# Patient Record
Sex: Female | Born: 1935 | Race: White | Hispanic: No | Smoking: Former smoker
Health system: Southern US, Community
[De-identification: ages and names within clinical notes are randomized; demographics above are authoritative.]

## PROBLEM LIST (undated history)

## (undated) DIAGNOSIS — K2970 Gastritis, unspecified, without bleeding: Secondary | ICD-10-CM

## (undated) DIAGNOSIS — F0390 Unspecified dementia without behavioral disturbance: Secondary | ICD-10-CM

## (undated) DIAGNOSIS — M1990 Unspecified osteoarthritis, unspecified site: Secondary | ICD-10-CM

## (undated) DIAGNOSIS — G459 Transient cerebral ischemic attack, unspecified: Secondary | ICD-10-CM

## (undated) DIAGNOSIS — K922 Gastrointestinal hemorrhage, unspecified: Secondary | ICD-10-CM

## (undated) DIAGNOSIS — T148XXA Other injury of unspecified body region, initial encounter: Secondary | ICD-10-CM

## (undated) DIAGNOSIS — K5790 Diverticulosis of intestine, part unspecified, without perforation or abscess without bleeding: Secondary | ICD-10-CM

## (undated) DIAGNOSIS — D500 Iron deficiency anemia secondary to blood loss (chronic): Secondary | ICD-10-CM

## (undated) DIAGNOSIS — Z8379 Family history of other diseases of the digestive system: Secondary | ICD-10-CM

## (undated) DIAGNOSIS — E7800 Pure hypercholesterolemia, unspecified: Secondary | ICD-10-CM

## (undated) DIAGNOSIS — F32A Depression, unspecified: Secondary | ICD-10-CM

## (undated) DIAGNOSIS — F329 Major depressive disorder, single episode, unspecified: Secondary | ICD-10-CM

## (undated) HISTORY — DX: Other injury of unspecified body region, initial encounter: T14.8XXA

## (undated) HISTORY — DX: Transient cerebral ischemic attack, unspecified: G45.9

## (undated) HISTORY — PX: ABDOMINAL HYSTERECTOMY: SHX81

## (undated) HISTORY — DX: Diverticulosis of intestine, part unspecified, without perforation or abscess without bleeding: K57.90

## (undated) HISTORY — DX: Depression, unspecified: F32.A

## (undated) HISTORY — DX: Major depressive disorder, single episode, unspecified: F32.9

## (undated) HISTORY — PX: OVARIAN CYST REMOVAL: SHX89

## (undated) HISTORY — DX: Pure hypercholesterolemia, unspecified: E78.00

## (undated) HISTORY — DX: Gastritis, unspecified, without bleeding: K29.70

## (undated) HISTORY — DX: Family history of other diseases of the digestive system: Z83.79

## (undated) HISTORY — PX: TONSILLECTOMY: SUR1361

## (undated) HISTORY — PX: APPENDECTOMY: SHX54

## (undated) HISTORY — DX: Unspecified osteoarthritis, unspecified site: M19.90

---

## 1958-09-22 HISTORY — PX: LAPAROTOMY: SHX154

## 1991-09-23 HISTORY — PX: ABDOMINAL ADHESION SURGERY: SHX90

## 1998-06-06 ENCOUNTER — Other Ambulatory Visit: Admission: RE | Admit: 1998-06-06 | Discharge: 1998-06-06 | Payer: Self-pay | Admitting: Obstetrics and Gynecology

## 1999-02-18 ENCOUNTER — Encounter: Payer: Self-pay | Admitting: Emergency Medicine

## 1999-02-19 ENCOUNTER — Observation Stay (HOSPITAL_COMMUNITY): Admission: EM | Admit: 1999-02-19 | Discharge: 1999-02-19 | Payer: Self-pay | Admitting: Emergency Medicine

## 1999-02-19 ENCOUNTER — Encounter: Payer: Self-pay | Admitting: Emergency Medicine

## 1999-09-23 DIAGNOSIS — K5790 Diverticulosis of intestine, part unspecified, without perforation or abscess without bleeding: Secondary | ICD-10-CM

## 1999-09-23 HISTORY — DX: Diverticulosis of intestine, part unspecified, without perforation or abscess without bleeding: K57.90

## 1999-10-04 ENCOUNTER — Other Ambulatory Visit: Admission: RE | Admit: 1999-10-04 | Discharge: 1999-10-04 | Payer: Self-pay | Admitting: Obstetrics and Gynecology

## 1999-12-02 ENCOUNTER — Emergency Department (HOSPITAL_COMMUNITY): Admission: EM | Admit: 1999-12-02 | Discharge: 1999-12-02 | Payer: Self-pay | Admitting: Emergency Medicine

## 1999-12-10 ENCOUNTER — Encounter: Payer: Self-pay | Admitting: Gastroenterology

## 1999-12-10 ENCOUNTER — Ambulatory Visit (HOSPITAL_COMMUNITY): Admission: RE | Admit: 1999-12-10 | Discharge: 1999-12-10 | Payer: Self-pay | Admitting: Gastroenterology

## 2000-01-08 ENCOUNTER — Encounter: Payer: Self-pay | Admitting: Gastroenterology

## 2000-05-11 ENCOUNTER — Encounter: Admission: RE | Admit: 2000-05-11 | Discharge: 2000-06-09 | Payer: Self-pay | Admitting: Sports Medicine

## 2000-08-24 ENCOUNTER — Encounter: Admission: RE | Admit: 2000-08-24 | Discharge: 2000-09-30 | Payer: Self-pay | Admitting: Sports Medicine

## 2000-10-02 ENCOUNTER — Emergency Department (HOSPITAL_COMMUNITY): Admission: EM | Admit: 2000-10-02 | Discharge: 2000-10-02 | Payer: Self-pay | Admitting: Emergency Medicine

## 2000-10-02 ENCOUNTER — Encounter: Payer: Self-pay | Admitting: Emergency Medicine

## 2001-05-04 ENCOUNTER — Other Ambulatory Visit: Admission: RE | Admit: 2001-05-04 | Discharge: 2001-05-04 | Payer: Self-pay | Admitting: Obstetrics and Gynecology

## 2001-05-10 ENCOUNTER — Emergency Department (HOSPITAL_COMMUNITY): Admission: EM | Admit: 2001-05-10 | Discharge: 2001-05-10 | Payer: Self-pay | Admitting: Emergency Medicine

## 2001-05-10 ENCOUNTER — Encounter: Payer: Self-pay | Admitting: Emergency Medicine

## 2001-08-14 ENCOUNTER — Encounter: Payer: Self-pay | Admitting: Emergency Medicine

## 2001-08-14 ENCOUNTER — Inpatient Hospital Stay (HOSPITAL_COMMUNITY): Admission: EM | Admit: 2001-08-14 | Discharge: 2001-08-19 | Payer: Self-pay | Admitting: Emergency Medicine

## 2001-08-15 ENCOUNTER — Encounter: Payer: Self-pay | Admitting: Gastroenterology

## 2001-10-07 ENCOUNTER — Encounter: Payer: Self-pay | Admitting: Internal Medicine

## 2001-10-07 ENCOUNTER — Ambulatory Visit (HOSPITAL_COMMUNITY): Admission: RE | Admit: 2001-10-07 | Discharge: 2001-10-07 | Payer: Self-pay | Admitting: Internal Medicine

## 2002-08-10 ENCOUNTER — Other Ambulatory Visit: Admission: RE | Admit: 2002-08-10 | Discharge: 2002-08-10 | Payer: Self-pay | Admitting: Obstetrics and Gynecology

## 2003-12-06 ENCOUNTER — Encounter: Admission: RE | Admit: 2003-12-06 | Discharge: 2003-12-06 | Payer: Self-pay | Admitting: Family Medicine

## 2004-04-15 ENCOUNTER — Emergency Department (HOSPITAL_COMMUNITY): Admission: EM | Admit: 2004-04-15 | Discharge: 2004-04-15 | Payer: Self-pay | Admitting: Internal Medicine

## 2005-01-30 ENCOUNTER — Ambulatory Visit: Payer: Self-pay | Admitting: Gastroenterology

## 2005-02-04 ENCOUNTER — Ambulatory Visit: Payer: Self-pay | Admitting: Gastroenterology

## 2005-02-14 ENCOUNTER — Ambulatory Visit: Payer: Self-pay | Admitting: Gastroenterology

## 2005-04-17 ENCOUNTER — Ambulatory Visit: Payer: Self-pay

## 2005-11-06 ENCOUNTER — Ambulatory Visit: Payer: Self-pay | Admitting: Gastroenterology

## 2006-07-28 ENCOUNTER — Ambulatory Visit: Payer: Self-pay | Admitting: Family Medicine

## 2006-08-06 ENCOUNTER — Ambulatory Visit: Payer: Self-pay | Admitting: Family Medicine

## 2006-08-06 LAB — CONVERTED CEMR LAB
ALT: 19 units/L (ref 0–40)
AST: 24 units/L (ref 0–37)
Alkaline Phosphatase: 59 units/L (ref 39–117)
Basophils Absolute: 0 10*3/uL (ref 0.0–0.1)
Basophils Relative: 0.8 % (ref 0.0–1.0)
CO2: 29 meq/L (ref 19–32)
Calcium: 9.2 mg/dL (ref 8.4–10.5)
Chloride: 102 meq/L (ref 96–112)
Chol/HDL Ratio, serum: 3.2
Cholesterol: 204 mg/dL (ref 0–200)
Eosinophil percent: 2.2 % (ref 0.0–5.0)
GFR calc non Af Amer: 84 mL/min
Glomerular Filtration Rate, Af Am: 102 mL/min/{1.73_m2}
Glucose, Bld: 104 mg/dL — ABNORMAL HIGH (ref 70–99)
MCV: 92 fL (ref 78.0–100.0)
Monocytes Relative: 9.1 % (ref 3.0–11.0)
Neutrophils Relative %: 59 % (ref 43.0–77.0)
Platelets: 320 10*3/uL (ref 150–400)
Potassium: 3.9 meq/L (ref 3.5–5.1)
RBC: 4.28 M/uL (ref 3.87–5.11)
RDW: 13.8 % (ref 11.5–14.6)
Sodium: 138 meq/L (ref 135–145)
TSH: 1.81 microintl units/mL (ref 0.35–5.50)
Total Bilirubin: 0.7 mg/dL (ref 0.3–1.2)
Total Protein: 6.5 g/dL (ref 6.0–8.3)
WBC: 5.5 10*3/uL (ref 4.5–10.5)

## 2006-09-22 DIAGNOSIS — K2970 Gastritis, unspecified, without bleeding: Secondary | ICD-10-CM

## 2006-09-22 HISTORY — DX: Gastritis, unspecified, without bleeding: K29.70

## 2006-11-17 ENCOUNTER — Ambulatory Visit: Payer: Self-pay | Admitting: Family Medicine

## 2006-12-22 ENCOUNTER — Ambulatory Visit: Payer: Self-pay | Admitting: Gastroenterology

## 2006-12-22 LAB — CONVERTED CEMR LAB
Basophils Absolute: 0.1 10*3/uL (ref 0.0–0.1)
Eosinophils Absolute: 0.2 10*3/uL (ref 0.0–0.6)
Eosinophils Relative: 2.6 % (ref 0.0–5.0)
HCT: 32 % — ABNORMAL LOW (ref 36.0–46.0)
Hemoglobin: 11.2 g/dL — ABNORMAL LOW (ref 12.0–15.0)
MCV: 89.1 fL (ref 78.0–100.0)
Monocytes Absolute: 0.8 10*3/uL — ABNORMAL HIGH (ref 0.2–0.7)
Monocytes Relative: 10.1 % (ref 3.0–11.0)
Platelets: 349 10*3/uL (ref 150–400)
RBC: 3.6 M/uL — ABNORMAL LOW (ref 3.87–5.11)

## 2006-12-25 ENCOUNTER — Ambulatory Visit: Payer: Self-pay | Admitting: Family Medicine

## 2007-02-02 ENCOUNTER — Encounter: Payer: Self-pay | Admitting: Family Medicine

## 2007-02-02 ENCOUNTER — Ambulatory Visit: Payer: Self-pay | Admitting: Family Medicine

## 2007-02-02 DIAGNOSIS — K149 Disease of tongue, unspecified: Secondary | ICD-10-CM | POA: Insufficient documentation

## 2007-02-02 DIAGNOSIS — F411 Generalized anxiety disorder: Secondary | ICD-10-CM | POA: Insufficient documentation

## 2007-02-19 DIAGNOSIS — I059 Rheumatic mitral valve disease, unspecified: Secondary | ICD-10-CM | POA: Insufficient documentation

## 2007-02-19 DIAGNOSIS — M810 Age-related osteoporosis without current pathological fracture: Secondary | ICD-10-CM | POA: Insufficient documentation

## 2007-02-19 DIAGNOSIS — K219 Gastro-esophageal reflux disease without esophagitis: Secondary | ICD-10-CM

## 2007-02-19 DIAGNOSIS — K5730 Diverticulosis of large intestine without perforation or abscess without bleeding: Secondary | ICD-10-CM

## 2007-02-19 DIAGNOSIS — Z8679 Personal history of other diseases of the circulatory system: Secondary | ICD-10-CM | POA: Insufficient documentation

## 2007-02-23 ENCOUNTER — Ambulatory Visit: Payer: Self-pay | Admitting: Family Medicine

## 2007-02-23 DIAGNOSIS — F329 Major depressive disorder, single episode, unspecified: Secondary | ICD-10-CM | POA: Insufficient documentation

## 2007-02-23 DIAGNOSIS — S0083XA Contusion of other part of head, initial encounter: Secondary | ICD-10-CM

## 2007-02-23 DIAGNOSIS — S0003XA Contusion of scalp, initial encounter: Secondary | ICD-10-CM | POA: Insufficient documentation

## 2007-02-23 DIAGNOSIS — S1093XA Contusion of unspecified part of neck, initial encounter: Secondary | ICD-10-CM

## 2007-03-23 ENCOUNTER — Ambulatory Visit: Payer: Self-pay | Admitting: Family Medicine

## 2007-03-23 DIAGNOSIS — F329 Major depressive disorder, single episode, unspecified: Secondary | ICD-10-CM

## 2007-04-26 ENCOUNTER — Telehealth (INDEPENDENT_AMBULATORY_CARE_PROVIDER_SITE_OTHER): Payer: Self-pay | Admitting: *Deleted

## 2007-06-01 ENCOUNTER — Ambulatory Visit: Payer: Self-pay | Admitting: Gastroenterology

## 2007-06-01 LAB — CONVERTED CEMR LAB
BUN: 11 mg/dL (ref 6–23)
CO2: 30 meq/L (ref 19–32)
Calcium: 9.3 mg/dL (ref 8.4–10.5)
Creatinine, Ser: 0.6 mg/dL (ref 0.4–1.2)
Eosinophils Relative: 3 % (ref 0.0–5.0)
GFR calc non Af Amer: 101 mL/min
Glucose, Bld: 93 mg/dL (ref 70–99)
Hemoglobin: 12.3 g/dL (ref 12.0–15.0)
Iron: 84 ug/dL (ref 42–145)
Lymphocytes Relative: 34.8 % (ref 12.0–46.0)
Monocytes Relative: 10.6 % (ref 3.0–11.0)
Neutro Abs: 3.4 10*3/uL (ref 1.4–7.7)
Neutrophils Relative %: 51 % (ref 43.0–77.0)
Potassium: 4.5 meq/L (ref 3.5–5.1)
RDW: 14.2 % (ref 11.5–14.6)
Saturation Ratios: 19.6 % — ABNORMAL LOW (ref 20.0–50.0)
Sodium: 133 meq/L — ABNORMAL LOW (ref 135–145)
Total Bilirubin: 0.6 mg/dL (ref 0.3–1.2)
Total Protein: 6.7 g/dL (ref 6.0–8.3)
Transferrin: 306.7 mg/dL (ref >=212.0)
Vitamin B-12: 474 pg/mL (ref 211–911)
WBC: 6.6 10*3/uL (ref 4.5–10.5)

## 2007-06-09 ENCOUNTER — Ambulatory Visit: Payer: Self-pay | Admitting: Gastroenterology

## 2007-06-09 ENCOUNTER — Encounter: Payer: Self-pay | Admitting: Family Medicine

## 2007-06-15 ENCOUNTER — Ambulatory Visit: Payer: Self-pay | Admitting: Gastroenterology

## 2007-08-02 ENCOUNTER — Ambulatory Visit: Payer: Self-pay | Admitting: Family Medicine

## 2007-08-02 DIAGNOSIS — E8941 Symptomatic postprocedural ovarian failure: Secondary | ICD-10-CM | POA: Insufficient documentation

## 2007-08-03 ENCOUNTER — Ambulatory Visit: Payer: Self-pay | Admitting: Family Medicine

## 2007-08-04 ENCOUNTER — Telehealth (INDEPENDENT_AMBULATORY_CARE_PROVIDER_SITE_OTHER): Payer: Self-pay | Admitting: *Deleted

## 2007-08-09 ENCOUNTER — Telehealth (INDEPENDENT_AMBULATORY_CARE_PROVIDER_SITE_OTHER): Payer: Self-pay | Admitting: *Deleted

## 2007-08-09 LAB — CONVERTED CEMR LAB
BUN: 9 mg/dL (ref 6–23)
Bacteria, UA: NEGATIVE
Basophils Relative: 0.9 % (ref 0.0–1.0)
Bilirubin Urine: NEGATIVE
Cholesterol: 199 mg/dL (ref 0–200)
Crystals: NEGATIVE
Glucose, Bld: 104 mg/dL — ABNORMAL HIGH (ref 70–99)
HCT: 39.7 % (ref 36.0–46.0)
Hemoglobin: 13.7 g/dL (ref 12.0–15.0)
Ketones, ur: NEGATIVE mg/dL
Lymphocytes Relative: 29.5 % (ref 12.0–46.0)
MCV: 90.4 fL (ref 78.0–100.0)
Mucus, UA: NEGATIVE
Neutro Abs: 3.5 10*3/uL (ref 1.4–7.7)
Nitrite: NEGATIVE
Platelets: 307 10*3/uL (ref 150–400)
Potassium: 4 meq/L (ref 3.5–5.1)
RBC: 4.39 M/uL (ref 3.87–5.11)
Sodium: 135 meq/L (ref 135–145)
Specific Gravity, Urine: 1.015 (ref 1.000–1.03)
Urine Glucose: NEGATIVE mg/dL
Urobilinogen, UA: 1 (ref 0.0–1.0)

## 2007-08-25 ENCOUNTER — Encounter: Payer: Self-pay | Admitting: Family Medicine

## 2007-09-02 ENCOUNTER — Encounter (INDEPENDENT_AMBULATORY_CARE_PROVIDER_SITE_OTHER): Payer: Self-pay | Admitting: *Deleted

## 2007-11-17 ENCOUNTER — Ambulatory Visit: Payer: Self-pay | Admitting: Family Medicine

## 2007-11-18 ENCOUNTER — Encounter: Payer: Self-pay | Admitting: Family Medicine

## 2007-11-23 LAB — CONVERTED CEMR LAB: Vit D, 1,25-Dihydroxy: 34 (ref 30–89)

## 2007-12-02 ENCOUNTER — Telehealth (INDEPENDENT_AMBULATORY_CARE_PROVIDER_SITE_OTHER): Payer: Self-pay | Admitting: *Deleted

## 2007-12-03 ENCOUNTER — Ambulatory Visit: Payer: Self-pay | Admitting: Family Medicine

## 2007-12-03 DIAGNOSIS — S8010XA Contusion of unspecified lower leg, initial encounter: Secondary | ICD-10-CM | POA: Insufficient documentation

## 2007-12-03 DIAGNOSIS — M545 Low back pain: Secondary | ICD-10-CM

## 2007-12-06 DIAGNOSIS — K2990 Gastroduodenitis, unspecified, without bleeding: Secondary | ICD-10-CM

## 2007-12-06 DIAGNOSIS — K2970 Gastritis, unspecified, without bleeding: Secondary | ICD-10-CM | POA: Insufficient documentation

## 2007-12-09 ENCOUNTER — Telehealth (INDEPENDENT_AMBULATORY_CARE_PROVIDER_SITE_OTHER): Payer: Self-pay | Admitting: *Deleted

## 2007-12-12 ENCOUNTER — Encounter: Admission: RE | Admit: 2007-12-12 | Discharge: 2007-12-12 | Payer: Self-pay | Admitting: Family Medicine

## 2007-12-13 ENCOUNTER — Telehealth (INDEPENDENT_AMBULATORY_CARE_PROVIDER_SITE_OTHER): Payer: Self-pay | Admitting: *Deleted

## 2007-12-13 DIAGNOSIS — S32009A Unspecified fracture of unspecified lumbar vertebra, initial encounter for closed fracture: Secondary | ICD-10-CM | POA: Insufficient documentation

## 2007-12-14 ENCOUNTER — Encounter: Payer: Self-pay | Admitting: Family Medicine

## 2007-12-14 ENCOUNTER — Encounter: Admission: RE | Admit: 2007-12-14 | Discharge: 2007-12-14 | Payer: Self-pay | Admitting: Family Medicine

## 2008-01-06 ENCOUNTER — Emergency Department (HOSPITAL_COMMUNITY): Admission: EM | Admit: 2008-01-06 | Discharge: 2008-01-06 | Payer: Self-pay | Admitting: Emergency Medicine

## 2008-01-06 ENCOUNTER — Telehealth (INDEPENDENT_AMBULATORY_CARE_PROVIDER_SITE_OTHER): Payer: Self-pay | Admitting: *Deleted

## 2008-01-27 ENCOUNTER — Ambulatory Visit: Payer: Self-pay | Admitting: Internal Medicine

## 2008-01-27 DIAGNOSIS — S0990XA Unspecified injury of head, initial encounter: Secondary | ICD-10-CM | POA: Insufficient documentation

## 2008-02-02 ENCOUNTER — Telehealth (INDEPENDENT_AMBULATORY_CARE_PROVIDER_SITE_OTHER): Payer: Self-pay | Admitting: *Deleted

## 2008-03-20 ENCOUNTER — Telehealth (INDEPENDENT_AMBULATORY_CARE_PROVIDER_SITE_OTHER): Payer: Self-pay | Admitting: *Deleted

## 2008-04-13 ENCOUNTER — Ambulatory Visit: Payer: Self-pay | Admitting: Family Medicine

## 2008-05-18 ENCOUNTER — Ambulatory Visit: Payer: Self-pay | Admitting: Internal Medicine

## 2008-05-18 DIAGNOSIS — L0390 Cellulitis, unspecified: Secondary | ICD-10-CM

## 2008-05-18 DIAGNOSIS — L0291 Cutaneous abscess, unspecified: Secondary | ICD-10-CM

## 2008-07-11 ENCOUNTER — Encounter (INDEPENDENT_AMBULATORY_CARE_PROVIDER_SITE_OTHER): Payer: Self-pay | Admitting: *Deleted

## 2008-08-02 ENCOUNTER — Telehealth (INDEPENDENT_AMBULATORY_CARE_PROVIDER_SITE_OTHER): Payer: Self-pay | Admitting: *Deleted

## 2008-10-19 ENCOUNTER — Ambulatory Visit: Payer: Self-pay | Admitting: Gastroenterology

## 2008-10-19 DIAGNOSIS — K5900 Constipation, unspecified: Secondary | ICD-10-CM | POA: Insufficient documentation

## 2008-10-26 ENCOUNTER — Telehealth: Payer: Self-pay | Admitting: Gastroenterology

## 2008-11-27 ENCOUNTER — Telehealth (INDEPENDENT_AMBULATORY_CARE_PROVIDER_SITE_OTHER): Payer: Self-pay | Admitting: *Deleted

## 2008-11-29 ENCOUNTER — Telehealth: Payer: Self-pay | Admitting: Gastroenterology

## 2008-11-30 ENCOUNTER — Ambulatory Visit: Payer: Self-pay | Admitting: Internal Medicine

## 2008-11-30 DIAGNOSIS — R1013 Epigastric pain: Secondary | ICD-10-CM

## 2008-12-04 LAB — CONVERTED CEMR LAB
AST: 27 units/L (ref 0–37)
Alkaline Phosphatase: 59 units/L (ref 39–117)
Amylase: 57 units/L (ref 27–131)
Basophils Relative: 0.9 % (ref 0.0–3.0)
MCV: 92 fL (ref 78.0–100.0)
Monocytes Relative: 9.9 % (ref 3.0–12.0)
Neutro Abs: 3.9 10*3/uL (ref 1.4–7.7)
Neutrophils Relative %: 56.9 % (ref 43.0–77.0)
RBC: 4.29 M/uL (ref 3.87–5.11)
WBC: 6.9 10*3/uL (ref 4.5–10.5)

## 2008-12-06 ENCOUNTER — Ambulatory Visit (HOSPITAL_COMMUNITY): Admission: RE | Admit: 2008-12-06 | Discharge: 2008-12-06 | Payer: Self-pay | Admitting: Internal Medicine

## 2008-12-07 ENCOUNTER — Telehealth (INDEPENDENT_AMBULATORY_CARE_PROVIDER_SITE_OTHER): Payer: Self-pay | Admitting: *Deleted

## 2008-12-07 ENCOUNTER — Encounter: Payer: Self-pay | Admitting: Family Medicine

## 2008-12-19 ENCOUNTER — Telehealth: Payer: Self-pay | Admitting: Family Medicine

## 2009-01-19 ENCOUNTER — Encounter: Payer: Self-pay | Admitting: Family Medicine

## 2009-01-19 ENCOUNTER — Encounter: Admission: RE | Admit: 2009-01-19 | Discharge: 2009-01-19 | Payer: Self-pay | Admitting: Neurology

## 2009-01-25 ENCOUNTER — Encounter (INDEPENDENT_AMBULATORY_CARE_PROVIDER_SITE_OTHER): Payer: Self-pay | Admitting: *Deleted

## 2009-01-25 ENCOUNTER — Telehealth (INDEPENDENT_AMBULATORY_CARE_PROVIDER_SITE_OTHER): Payer: Self-pay | Admitting: *Deleted

## 2009-01-25 ENCOUNTER — Encounter: Payer: Self-pay | Admitting: Family Medicine

## 2009-01-31 ENCOUNTER — Encounter: Admission: RE | Admit: 2009-01-31 | Discharge: 2009-03-08 | Payer: Self-pay | Admitting: Neurology

## 2009-02-01 ENCOUNTER — Telehealth (INDEPENDENT_AMBULATORY_CARE_PROVIDER_SITE_OTHER): Payer: Self-pay | Admitting: *Deleted

## 2009-02-16 ENCOUNTER — Ambulatory Visit: Payer: Self-pay | Admitting: Family Medicine

## 2009-03-09 ENCOUNTER — Ambulatory Visit: Payer: Self-pay | Admitting: Family Medicine

## 2009-03-09 DIAGNOSIS — Z780 Asymptomatic menopausal state: Secondary | ICD-10-CM | POA: Insufficient documentation

## 2009-03-13 ENCOUNTER — Ambulatory Visit: Payer: Self-pay | Admitting: Family Medicine

## 2009-03-13 LAB — CONVERTED CEMR LAB
Bilirubin Urine: NEGATIVE
Blood in Urine, dipstick: NEGATIVE
Glucose, Urine, Semiquant: NEGATIVE
Nitrite: NEGATIVE
Protein, U semiquant: NEGATIVE
Urobilinogen, UA: 0.2

## 2009-03-14 ENCOUNTER — Encounter: Payer: Self-pay | Admitting: Family Medicine

## 2009-03-16 ENCOUNTER — Encounter (INDEPENDENT_AMBULATORY_CARE_PROVIDER_SITE_OTHER): Payer: Self-pay | Admitting: *Deleted

## 2009-03-16 LAB — CONVERTED CEMR LAB
Albumin: 3.7 g/dL (ref 3.5–5.2)
Basophils Absolute: 0 10*3/uL (ref 0.0–0.1)
Calcium: 9 mg/dL (ref 8.4–10.5)
Creatinine, Ser: 0.5 mg/dL (ref 0.4–1.2)
Eosinophils Absolute: 0.3 10*3/uL (ref 0.0–0.7)
Glucose, Bld: 114 mg/dL — ABNORMAL HIGH (ref 70–99)
HDL: 76.6 mg/dL (ref >39.00)
Hemoglobin: 14 g/dL (ref 12.0–15.0)
MCHC: 34.9 g/dL (ref 30.0–36.0)
MCV: 91.3 fL (ref 78.0–100.0)
Neutrophils Relative %: 49.6 % (ref 43.0–77.0)
RBC: 4.37 M/uL (ref 3.87–5.11)
RDW: 13.3 % (ref 11.5–14.6)
Sodium: 137 meq/L (ref 135–145)
Total Protein: 7.1 g/dL (ref 6.0–8.3)
Triglycerides: 60 mg/dL (ref 0.0–149.0)

## 2009-08-10 ENCOUNTER — Ambulatory Visit: Payer: Self-pay | Admitting: Family Medicine

## 2009-08-10 DIAGNOSIS — IMO0002 Reserved for concepts with insufficient information to code with codable children: Secondary | ICD-10-CM | POA: Insufficient documentation

## 2009-08-14 ENCOUNTER — Ambulatory Visit: Payer: Self-pay | Admitting: Family Medicine

## 2009-08-15 ENCOUNTER — Encounter (INDEPENDENT_AMBULATORY_CARE_PROVIDER_SITE_OTHER): Payer: Self-pay | Admitting: *Deleted

## 2009-08-20 ENCOUNTER — Telehealth: Payer: Self-pay | Admitting: Family Medicine

## 2009-09-27 ENCOUNTER — Telehealth: Payer: Self-pay | Admitting: Gastroenterology

## 2009-10-11 ENCOUNTER — Encounter: Payer: Self-pay | Admitting: Family Medicine

## 2009-10-12 ENCOUNTER — Telehealth: Payer: Self-pay | Admitting: Gastroenterology

## 2010-01-07 ENCOUNTER — Ambulatory Visit: Payer: Self-pay | Admitting: Family Medicine

## 2010-01-07 DIAGNOSIS — R5381 Other malaise: Secondary | ICD-10-CM | POA: Insufficient documentation

## 2010-01-07 DIAGNOSIS — R0989 Other specified symptoms and signs involving the circulatory and respiratory systems: Secondary | ICD-10-CM

## 2010-01-07 DIAGNOSIS — R5383 Other fatigue: Secondary | ICD-10-CM

## 2010-01-07 DIAGNOSIS — R0609 Other forms of dyspnea: Secondary | ICD-10-CM

## 2010-01-09 LAB — CONVERTED CEMR LAB
BUN: 13 mg/dL (ref 6–23)
Creatinine, Ser: 0.6 mg/dL (ref 0.4–1.2)
Free T4: 0.8 ng/dL (ref 0.6–1.6)
HCT: 37.1 % (ref 36.0–46.0)
MCV: 92.4 fL (ref 78.0–100.0)
RBC: 4.02 M/uL (ref 3.87–5.11)
RDW: 14.4 % (ref 11.5–14.6)
Sodium: 134 meq/L — ABNORMAL LOW (ref 135–145)
Vitamin B-12: 727 pg/mL (ref 211–911)
WBC: 6.3 10*3/uL (ref 4.5–10.5)

## 2010-01-28 ENCOUNTER — Ambulatory Visit (HOSPITAL_COMMUNITY): Admission: RE | Admit: 2010-01-28 | Discharge: 2010-01-28 | Payer: Self-pay | Admitting: Family Medicine

## 2010-01-28 ENCOUNTER — Encounter: Payer: Self-pay | Admitting: Family Medicine

## 2010-01-28 ENCOUNTER — Ambulatory Visit: Payer: Self-pay | Admitting: Internal Medicine

## 2010-01-28 ENCOUNTER — Ambulatory Visit: Payer: Self-pay

## 2010-02-21 ENCOUNTER — Ambulatory Visit: Payer: Self-pay | Admitting: Cardiovascular Disease

## 2010-02-21 DIAGNOSIS — R002 Palpitations: Secondary | ICD-10-CM | POA: Insufficient documentation

## 2010-03-19 ENCOUNTER — Ambulatory Visit (HOSPITAL_COMMUNITY): Admission: RE | Admit: 2010-03-19 | Discharge: 2010-03-19 | Payer: Self-pay | Admitting: Cardiovascular Disease

## 2010-03-19 ENCOUNTER — Ambulatory Visit: Payer: Self-pay

## 2010-03-19 ENCOUNTER — Ambulatory Visit: Payer: Self-pay | Admitting: Cardiology

## 2010-03-19 ENCOUNTER — Encounter: Payer: Self-pay | Admitting: Cardiovascular Disease

## 2010-04-05 ENCOUNTER — Encounter: Payer: Self-pay | Admitting: Family Medicine

## 2010-04-09 ENCOUNTER — Encounter: Payer: Self-pay | Admitting: Internal Medicine

## 2010-04-12 ENCOUNTER — Ambulatory Visit: Payer: Self-pay | Admitting: Family Medicine

## 2010-04-12 DIAGNOSIS — R413 Other amnesia: Secondary | ICD-10-CM | POA: Insufficient documentation

## 2010-04-12 DIAGNOSIS — S62109A Fracture of unspecified carpal bone, unspecified wrist, initial encounter for closed fracture: Secondary | ICD-10-CM | POA: Insufficient documentation

## 2010-04-22 LAB — CONVERTED CEMR LAB
ALT: 14 units/L (ref 0–35)
AST: 23 units/L (ref 0–37)
Albumin: 4 g/dL (ref 3.5–5.2)
Alkaline Phosphatase: 63 units/L (ref 39–117)
Basophils Absolute: 0.1 10*3/uL (ref 0.0–0.1)
Chloride: 101 meq/L (ref 96–112)
Creatinine, Ser: 0.64 mg/dL (ref 0.40–1.20)
Eosinophils Absolute: 0.3 10*3/uL (ref 0.0–0.7)
Folate: >20 ng/mL
HCT: 39 % (ref 36.0–46.0)
Hemoglobin: 13 g/dL (ref 12.0–15.0)
Lymphocytes Relative: 23 % (ref 12–46)
MCV: 89.4 fL (ref 78.0–100.0)
RBC: 4.36 M/uL (ref 3.87–5.11)
RDW: 14.6 % (ref 11.5–15.5)
Sodium: 136 meq/L (ref 135–145)
TSH: 1.679 microintl units/mL (ref 0.350–4.500)
Vitamin B-12: 869 pg/mL (ref 211–911)

## 2010-05-10 ENCOUNTER — Telehealth: Payer: Self-pay | Admitting: Gastroenterology

## 2010-05-10 ENCOUNTER — Ambulatory Visit: Payer: Self-pay | Admitting: Internal Medicine

## 2010-05-28 ENCOUNTER — Ambulatory Visit: Payer: Self-pay | Admitting: Gastroenterology

## 2010-05-28 ENCOUNTER — Encounter: Admission: RE | Admit: 2010-05-28 | Discharge: 2010-05-28 | Payer: Self-pay | Admitting: Neurology

## 2010-07-22 ENCOUNTER — Telehealth: Payer: Self-pay | Admitting: Family Medicine

## 2010-07-26 ENCOUNTER — Ambulatory Visit: Payer: Self-pay | Admitting: Family Medicine

## 2010-08-05 ENCOUNTER — Telehealth: Payer: Self-pay | Admitting: Family Medicine

## 2010-08-29 ENCOUNTER — Telehealth: Payer: Self-pay | Admitting: Family Medicine

## 2010-09-06 ENCOUNTER — Ambulatory Visit: Payer: Self-pay | Admitting: Family Medicine

## 2010-10-22 NOTE — Progress Notes (Signed)
Summary: Celexa issue  Phone Note Call from Patient   Caller: Daughter [phone]   cell [phone] Summary of Call: Patient daughter left message on triage that her mother has stopped Celexa and requests call to discuss the side effects she is having.  Lucious Groves CMA,  July 22, 2010 3:04 PM  Lm at both numbers to call back to office. Lucious Groves CMA  July 23, 2010 9:53 AM   Lm at both numbers to call back to office. Lucious Groves CMA  July 24, 2010 4:14 PM   Follow-up for Phone Call        daughter--also named Equilla Weseman--returned call---says she will be coming with her mom to the appointment on Friday 11/4 at 3:45, so Tresa Endo does not have to call her back unless Tresa Endo needs to tell [place] something else----daughter Susanne will be bringing an "information sheet" from a psychiatrist ( has not seen patient) of suggested doses of an antidepressant Celexa Follow-up by: Jerolyn Shin,  July 25, 2010 1:12 PM  Additional Follow-up for Phone Call Additional follow up Details #1::        Noted, thanks. Additional Follow-up by: Lucious Groves CMA,  July 25, 2010 3:03 PM

## 2010-10-22 NOTE — Progress Notes (Signed)
Summary: Triage   Phone Note Call from Patient Call back at Home Phone [phone]   Caller: Patient Call For: Dr. Jarold Motto Reason for Call: Talk to Nurse Summary of Call: pt. hurt her arm, been on prednisone....causing abd pain and cannot eat anything Initial call taken by: Karna Christmas,  May 10, 2010 8:34 AM  Follow-up for Phone Call        LM for pt to call.  Lupita Leash Surface RN  May 10, 2010 8:47 AM  Pt states she has been on medications for sprained wrist.  Prednisone and arthritis meds.  Today stomach is burning and pt states she can not eat.  Even water hurts her stomach.  Req OV.  Appt sch for pt to see Rozetta Nunnery NP this pm. Follow-up by: Ashok Cordia RN,  May 10, 2010 10:38 AM

## 2010-10-22 NOTE — Consult Note (Signed)
Summary: [facility]  [facility]   Imported By: Lanelle Bal 10/22/2009 10:53:44  _____________________________________________________________________  External Attachment:    Type:   Image     Comment:   External Document

## 2010-10-22 NOTE — Progress Notes (Signed)
Summary: Triage   Phone Note Call from Patient Call back at Home Phone [phone]   Caller: Patient Call For: Dr Jarold Motto Reason for Call: Talk to Nurse Summary of Call: Wants to know if Carafate if for heartburn and if you have to have a prescription for it? Initial call taken by: Karna Christmas,  September 27, 2009 1:30 PM  Follow-up for Phone Call        Pt states she has had alot of indigestion lately.  Has taken carafate in the past.  Zegerid is listed on medication list but pt states she is not taking it at the present time.  Encouraged pt to resume Zegerid and if no improvement to call back for OV.   Follow-up by: Ashok Cordia RN,  September 27, 2009 2:42 PM

## 2010-10-22 NOTE — Letter (Signed)
Summary: [facility]  [facility]   Imported By: Ashley Burnett 04/18/2010 12:03:23  _____________________________________________________________________  External Attachment:    Type:   Image     Comment:   External Document

## 2010-10-22 NOTE — Progress Notes (Signed)
Summary: verify med instruction  Phone Note Call from Patient Call back at [phone], [phone]   Caller: Daughter(Batya) Summary of Call: Pt daughter would like to know how long she is to continue on  the celexa 20 mg tab until she increases to 40mg . Pt was instructed to Take 1/2 celexa 20 mg every day for 8 days then increase to 1 tab a day. Pls advise...........Marland KitchenFelecia Deloach CMA  August 05, 2010 3:48 PM   Follow-up for Phone Call        stay on 20 mg until I see her in 1 month Follow-up by: Loreen Freud DO,  August 05, 2010 4:43 PM  Additional Follow-up for Phone Call Additional follow up Details #1::        Pt daughter aware to continue with current dose and f/u in 1 month.............Marland KitchenFelecia Deloach CMA  August 05, 2010 5:14 PM

## 2010-10-22 NOTE — Assessment & Plan Note (Signed)
Summary: memory issues, (has new meds)--daughter will come w/her///sph   Vital Signs:  Patient profile:   77 year old female Weight:      107.0 pounds Temp:     97.0 degrees F oral BP sitting:   118 / 74  (left arm) Cuff size:   regular  Vitals Entered By: Almeta Monas CMA Duncan Dull) (July 26, 2010 4:10 PM) CC: c/o memory loss and pt's daughter is concerned that pt may not be taking the correct meds   History of Present Illness: Pt is here with daughter c/o not taking meds since being put on vicodin.   Pt stopped everything cold [place].  Pt is able to drive to [place] and knows exactly where she is going and never gets lost but when her meds are put out for her she takes what she wants when she wants per her daughter.    Current Medications (verified): 1)  Celexa 20 Mg Tabs (Citalopram Hydrobromide) .Marland Kitchen.. 1 By Mouth Once Daily 2)  Multivitamins   Tabs (Multiple Vitamin) .Marland Kitchen.. 1 By Mouth Once Daily 3)  Miralax  Powd (Polyethylene Glycol 3350) .... As Needed 4)  Carafate 1 Gm/34ml  Susp (Sucralfate) .Marland Kitchen.. 10 Cc By Mouth Qid 5)  Omeprazole 20 Mg Cpdr (Omeprazole) .... Take 1 Capsule 30 Min Prior To Breakfast 6)  Aricept 5 Mg Tabs (Donepezil Hcl) .Marland Kitchen.. 1 By Mouth Once Daily  Allergies (verified): 1)  ! Indocin 2)  ! Codeine  Past History:  Past Medical History: Last updated: 11/30/2008 Diverticulosis, universal, severe GERD Osteoporosis Transient ischemic attack, hx of Fractures- leg,wrist High Cholesterol Depression Gastritis  Past Surgical History: Last updated: 11/30/2008 Hysterectomy SBO with laparotomy, 1960s,  SBO with lysis of adhesions, 1993 ovarian cyst removed Tonsillectomy Appendectomy  Family History: Last updated: 10/19/2008 Family History Stroke No FH of Colon Cancer: Family History of Heart Disease: aunt  Social History: Last updated: 10/19/2008 Former Smoker Alcohol use-no Drug use-no Patient does not get regular exercise.   Risk  Factors: Alcohol Use: 0 (03/09/2009) Caffeine Use: 0 (03/09/2009) Exercise: no (03/09/2009)  Risk Factors: Smoking Status: never (03/09/2009) Passive Smoke Exposure: no (08/02/2007)  Family History: Reviewed history from 10/19/2008 and no changes required. Family History Stroke No FH of Colon Cancer: Family History of Heart Disease: aunt  Social History: Reviewed history from 10/19/2008 and no changes required. Former Smoker Alcohol use-no Drug use-no Patient does not get regular exercise.   Review of Systems      See HPI  Physical Exam  General:  Well-developed,well-nourished,in no acute distress; alert,appropriate and cooperative throughout examination Psych:  Cognition and judgment appear intact. Alert and cooperative with normal attention span and concentration. No apparent delusions, illusions, hallucinations   Impression & Recommendations:  Problem # 1:  MEMORY LOSS (ICD-780.93) start Aricept 5 mg 1 by mouth once daily  pt in office with daughter for > 30 min---more than 50% face to face  Problem # 2:  DEPRESSION (ICD-311)  Her updated medication list for this problem includes:    Celexa 20 Mg Tabs (Citalopram hydrobromide) .Marland Kitchen... 1 by mouth once daily  Complete Medication List: 1)  Celexa 20 Mg Tabs (Citalopram hydrobromide) .Marland Kitchen.. 1 by mouth once daily 2)  Multivitamins Tabs (Multiple vitamin) .Marland Kitchen.. 1 by mouth once daily 3)  Miralax Powd (Polyethylene glycol 3350) .... As needed 4)  Carafate 1 Gm/76ml Susp (Sucralfate) .Marland Kitchen.. 10 cc by mouth qid 5)  Omeprazole 20 Mg Cpdr (Omeprazole) .... Take 1 capsule 30 min prior  to breakfast 6)  Aricept 5 Mg Tabs (Donepezil hcl) .Marland Kitchen.. 1 by mouth once daily  Patient Instructions: 1)  Take 1/2 celexa 20 mg every day for 8 days then increase to 1 tab a day  2)  Finish the cephalexin ----do not take the augmentin ( we got rid of it for you) 3)  take the aricept everyday 4)  Please schedule a follow-up appointment in 1 month.    Prescriptions: CELEXA 20 MG TABS (CITALOPRAM HYDROBROMIDE) 1 by mouth once daily  #30 x 0   Entered and Authorized by:   Loreen Freud DO   Signed by:   Loreen Freud DO on 07/26/2010   Method used:   Electronically to        [id number]* (retail)       [address]       [place], [place]  [address]       Ph: [phone]       Fax: [phone]   RxID:   [id number]    Orders Added: 1)  Est. Patient Level IV [[address]]

## 2010-10-22 NOTE — Assessment & Plan Note (Signed)
Summary: REHECK HER BROKEN RIGHT ARM--IN ALOT OF PAIN--PH   Vital Signs:  Patient profile:   77 year old female Height:      64 inches Weight:      108 pounds Temp:     98.4 degrees F oral Pulse rate:   68 / minute BP sitting:   130 / 62  (left arm)  Vitals Entered By: Jeremy Johann CMA (April 12, 2010 2:42 PM) CC: recheck rt wrist   History of Present Illness: Pt here f/u ortho for broken R wrist.  Pt states Dr Eulah Pont told her to f/u here---we have no records.  When records arrived -- pt was sent here to see a rash under her splint.   Pt states rash has resolved.  Pt had trouble remembering when incident with wrist happened and when whe saw Dr Eulah Pont.    Current Medications (verified): 1)  Celexa 40 Mg  Tabs (Citalopram Hydrobromide) .Marland Kitchen.. 1 By Mouth Once Daily 2)  Multivitamins   Tabs (Multiple Vitamin) .Marland Kitchen.. 1 By Mouth Once Daily 3)  Vitamin E 600 Unit  Caps (Vitamin E) .Marland Kitchen.. 1 By Mouth Once Daily 4)  Miralax  Powd (Polyethylene Glycol 3350) .... As Needed 5)  Zegerid 20-1100 Mg Caps (Omeprazole-Sodium Bicarbonate) .Marland Kitchen.. 1 By Mouth Once Daily 6)  Vicodin 5-500 Mg Tabs (Hydrocodone-Acetaminophen) .Marland Kitchen.. 1 By Mouth Every 6 Hours As Needed 7)  Carafate 1 Gm/31ml  Susp (Sucralfate) .Marland Kitchen.. 10 Cc By Mouth Qid 8)  Ultram 50 Mg Tabs (Tramadol Hcl) .... 1/2 -1 By Mouth Every 6 Hours As Needed Pain  Allergies (verified): 1)  ! Indocin 2)  ! Codeine  Past History:  Past medical, surgical, family and social histories (including risk factors) reviewed for relevance to current acute and chronic problems.  Past Medical History: Reviewed history from 11/30/2008 and no changes required. Diverticulosis, universal, severe GERD Osteoporosis Transient ischemic attack, hx of Fractures- leg,wrist High Cholesterol Depression Gastritis  Past Surgical History: Reviewed history from 11/30/2008 and no changes required. Hysterectomy SBO with laparotomy, 1960s,  SBO with lysis of adhesions,  1993 ovarian cyst removed Tonsillectomy Appendectomy  Family History: Reviewed history from 10/19/2008 and no changes required. Family History Stroke No FH of Colon Cancer: Family History of Heart Disease: aunt  Social History: Reviewed history from 10/19/2008 and no changes required. Former Smoker Alcohol use-no Drug use-no Patient does not get regular exercise.   Review of Systems      See HPI  Physical Exam  General:  Well-developed,well-nourished,in no acute distress; alert,appropriate and cooperative throughout examination Msk:  R wrist in splint. Skin:  no rash under splint Psych:  flat affect, memory impairment, disoriented to time, and disoriented to place.     Impression & Recommendations:  Problem # 1:  FRACTURE, WRIST, RIGHT (ICD-814.00) per ortho apparently she had a rash under splint but that has cleared up ultram 50 mg #30  1 by mouth every 6 hours as needed   Problem # 2:  MEMORY LOSS (ICD-780.93)  Orders: Venipuncture ([address]) TLB-B12 + Folate Pnl (60454_09811-B14/NWG) TLB-BMP (Basic Metabolic Panel-BMET) (80048-METABOL) TLB-CBC Platelet - w/Differential (85025-CBCD) TLB-Hepatic/Liver Function Pnl (80076-HEPATIC) TLB-TSH (Thyroid Stimulating Hormone) (95621-HYQ) Neurology Referral (Neuro)  Complete Medication List: 1)  Celexa 40 Mg Tabs (Citalopram hydrobromide) .Marland Kitchen.. 1 by mouth once daily 2)  Multivitamins Tabs (Multiple vitamin) .Marland Kitchen.. 1 by mouth once daily 3)  Vitamin E 600 Unit Caps (Vitamin e) .Marland Kitchen.. 1 by mouth once daily 4)  Miralax Powd (Polyethylene glycol 3350) .... As  needed 5)  Zegerid 20-1100 Mg Caps (Omeprazole-sodium bicarbonate) .Marland Kitchen.. 1 by mouth once daily 6)  Vicodin 5-500 Mg Tabs (Hydrocodone-acetaminophen) .Marland Kitchen.. 1 by mouth every 6 hours as needed 7)  Carafate 1 Gm/56ml Susp (Sucralfate) .Marland Kitchen.. 10 cc by mouth qid 8)  Ultram 50 Mg Tabs (Tramadol hcl) .... 1/2 -1 by mouth every 6 hours as needed pain Prescriptions: ULTRAM 50 MG TABS  (TRAMADOL HCL) 1/2 -1 by mouth every 6 hours as needed pain  #30 x 0   Entered and Authorized by:   Loreen Freud DO   Signed by:   Loreen Freud DO on 04/12/2010   Method used:   Electronically to        [facility]* (retail)       [address]       [place], [place]  [address]       Ph: [phone] or [phone]       Fax: [phone]   RxID:   [id number]

## 2010-10-22 NOTE — Assessment & Plan Note (Signed)
Summary: np3/DOE/jml      Allergies Added:   Primary Provider:  Loreen Freud DO  CC:  sob.  History of Present Illness: Ashley Burnett is seen today at the request of Dr Laury Axon for dyspnea and palpitattions.  She has no previously documented heart problems and seems a bit depressed to me.  I reviewed her ECG and echo from May of this year ordered by Dr Laury Axon.  EF is normal with no significant valve disease.  ECG is normal.  She is a previous smoker with no active wheezing cough or sputum  She has a sensation of rapid heart beating or tachycardia with ambulaiton.  She denies syncope, SSCP, edema PND, or orthopnea.  There have been no signs of volume overload or CHF.  She thinks the palpitatons and dyspnea have been going n for about a year and are persistant but not progressive.  I reviewed her labs from 4/11 and they are normal with a Hct of 37 and normal TSH.  BNP was not checked.  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) 16)  Gastritis  (ICD-535.50) 17)  Low Back Pain, Chronic  (ICD-724.2) 18)  Contusion of Unspecified Part of Lower Limb  (ICD-924.5) 19)  Artificial Menopause  (ICD-627.4) 20)  Depression  (ICD-311) 21)  Contusion, Head  (ICD-920) 22)  Depression, Chronic, Hx of  (ICD-V11.8) 23)  Mitral Valve Prolapse  (ICD-424.0) 24)  Transient Ischemic Attack, Hx of  (ICD-V12.50) 25)  Osteoporosis   (ICD-733.00) 26)  Gerd  (ICD-530.81) 27)  Diverticulosis, Colon  (ICD-562.10) 28)  Anxiety State Nos  (ICD-300.00) 29)  Disorder, Tongue Nos  (ICD-529.9)  Current Medications (verified): 1)  Celexa 40 Mg  Tabs (Citalopram Hydrobromide) .Marland Kitchen.. 1 By Mouth Once Daily 2)  Multivitamins   Tabs (Multiple Vitamin) .Marland Kitchen.. 1 By Mouth Once Daily 3)  Vitamin E 600 Unit  Caps (Vitamin E) .Marland Kitchen.. 1 By Mouth Once Daily 4)  Miralax  Powd (Polyethylene Glycol 3350) .... As Needed 5)  Zegerid 20-1100 Mg Caps (Omeprazole-Sodium Bicarbonate) .Marland Kitchen.. 1 By Mouth Once Daily 6)  Vicodin 5-500 Mg Tabs (Hydrocodone-Acetaminophen) .Marland Kitchen.. 1 By Mouth Every 6 Hours As Needed 7)  Carafate 1 Gm/70ml  Susp (Sucralfate) .Marland Kitchen.. 10 Cc By Mouth Qid  Allergies (verified): 1)  ! Indocin 2)  ! Codeine  Past History:  Past Medical History: Last updated: 11/30/2008 Diverticulosis, universal, severe GERD Osteoporosis Transient ischemic attack, hx of Fractures- leg,wrist High Cholesterol Depression Gastritis  Past Surgical History: Last updated: 11/30/2008 Hysterectomy SBO with laparotomy, 1960s,  SBO with lysis of adhesions, 1993 ovarian cyst removed Tonsillectomy Appendectomy  Family History: Last updated: 10/19/2008 Family History Stroke No FH of Colon Cancer: Family History of Heart Disease: aunt  Social History: Last updated: 10/19/2008 Former Smoker Alcohol use-no Drug use-no Patient does not get regular exercise.   Review of Systems       Denies fever, mal[place],  weight loss, blurry vision, decreased visual acuity, cough, sputum, , hemoptysis, pleuritic pain, , heartburn, abdominal pain, melena, lower extremity edema, claudication, or rash.   Vital Signs:  Patient profile:   77 year old female Height:      64 inches Weight:      108 pounds BMI:     18.61 Pulse rate:   63 / minute Resp:     14 per minute BP sitting:   115 / 67  (left arm)  Vitals Entered By: Kem Parkinson (February 21, 2010 9:28  AM)  Physical Exam  General:  Affect appropriate Healthy:  appears stated age HEENT: normal Neck supple with no adenopathy JVP normal no bruits no thyromegaly Lungs clear with no wheezing and good diaphragmatic motion Heart:  S1/S2 no murmur,rub, gallop or click PMI normal Abdomen: benighn, BS positve, no tenderness, no AAA no bruit.  No HSM or HJR Distal pulses intact with no bruits No edema Neuro non-focal Skin warm and dry Affect flat/depressed   Impression & Recommendations:  Problem # 1:  DYSPNEA ON EXERTION (ICD-786.09) Normal cardiopulmonary exam.  I walked patient in halls and her HR was not excessive and there were no arrythmias.  Consider F/U CXR She will have a stress echo to check saturations and hemodynamic response to exercise and R/O CAD Orders: Stress Echo (Stress Echo)  Problem # 2:  PALPITATIONS (ICD-785.1) Benign with no evidence of structural heart disease.  Will see what pulse does on treadmill  Patient Instructions: 1)  Your physician recommends that you schedule a follow-up appointment in: AS NEEDED PENDING TEST RESULTS 2)  Your physician has requested that you have a stress echocardiogram. For further information please visit [email or web address]  Please follow instruction sheet as given.   EKG Report  Procedure date:  01/07/2010  Findings:      NSR 66 Normal ECG

## 2010-10-22 NOTE — Letter (Signed)
Summary: MMSE Form  MMSE Form   Imported By: Lanelle Bal 08/05/2010 12:43:20  _____________________________________________________________________  External Attachment:    Type:   Image     Comment:   External Document

## 2010-10-22 NOTE — Progress Notes (Signed)
Summary: handicap sticker  Phone Note Call from Patient Call back at Home Phone [phone]   Summary of Call: pt left message on machine that her handicap sticker is expired. pt states that she would like to get it renewed. please give her a call so that she can see how to go about getting this done...............Marland KitchenFelecia Deloach CMA  Jan 25, 2009 8:13 AM  Follow-up for Phone Call        Patient aware sticker paperwork has been mailed to her. Ardyth Man  Jan 25, 2009 9:07 AM  Follow-up by: Ardyth Man,  Jan 25, 2009 9:07 AM

## 2010-10-22 NOTE — Progress Notes (Signed)
Summary: Medication Question  Phone Note Call from Patient Call back at [phone]   Caller: Daughter Summary of Call: Patient's daughter called to let us know that the patient will be out of her Celexa tomorrow and they wanted to know should she start taking the 40mg  now or wait and keep taking the 20mg  until they see you next week. The daughter has 40mg  and wanted to know if that could be cut in half so they didn't have to get another rx. Please advise.   Daughter home number:[phone] Initial call taken by: Harold Barban,  August 29, 2010 1:30 PM  Follow-up for Phone Call        if she needs to increase to 40 mg she can go ahead and take the 40mg  daily but keep appointment Follow-up by: Loreen Freud DO,  August 29, 2010 2:56 PM  Additional Follow-up for Phone Call Additional follow up Details #1::        PT daughter aware...........Marland KitchenFelecia Deloach CMA  August 29, 2010 3:36 PM

## 2010-10-22 NOTE — Progress Notes (Signed)
Summary: Triage  Medications Added CELEXA 20 MG TABS (CITALOPRAM HYDROBROMIDE) 1 by mouth once daily CELEXA 40 MG  TABS (CITALOPRAM HYDROBROMIDE) 1 by mouth once daily CELEXA 40 MG  TABS (CITALOPRAM HYDROBROMIDE) 1 by mouth once daily CELEXA 40 MG  TABS (CITALOPRAM HYDROBROMIDE) 1 by mouth once daily**DUE FOR OFFICE VISIT NOW** PREVACID 30 MG  CPDR (LANSOPRAZOLE) 1 by mouth once daily PREVACID 30 MG  CPDR (LANSOPRAZOLE) 1 by mouth once daily ADULT ASPIRIN LOW STRENGTH 81 MG  TBDP (ASPIRIN) 1 by mouth once daily ADULT ASPIRIN LOW STRENGTH 81 MG  TBDP (ASPIRIN) 1 by mouth once daily VICODIN 5-500 MG  TABS (HYDROCODONE-ACETAMINOPHEN) 1-2 every 6 hours as needed pain VICODIN 5-500 MG  TABS (HYDROCODONE-ACETAMINOPHEN) 1-2 every 6 hours as needed pain FOSAMAX 70 MG  TABS (ALENDRONATE SODIUM) 1 by mouth q week FOSAMAX 70 MG  TABS (ALENDRONATE SODIUM) 1 by mouth q week XANAX 0.25 MG  TABS (ALPRAZOLAM) TAKE 1 30 MIN BEFORE PROCEDURE TAKE BOTTLE TO MRI XANAX 0.25 MG  TABS (ALPRAZOLAM) TAKE 1 30 MIN BEFORE PROCEDURE TAKE BOTTLE TO MRI FLEXERIL 10 MG  TABS (CYCLOBENZAPRINE HCL) 1 by mouth three times a day as needed FLEXERIL 10 MG  TABS (CYCLOBENZAPRINE HCL) 1 by mouth three times a day as needed AMOXICILLIN-POT CLAVULANATE 500-125 MG TABS (AMOXICILLIN-POT CLAVULANATE) 1 q 12 hrs with a meal AMOXICILLIN-POT CLAVULANATE 500-125 MG TABS (AMOXICILLIN-POT CLAVULANATE) 1 q 12 hrs with a meal MULTIVITAMINS   TABS (MULTIPLE VITAMIN) 1 by mouth once daily VITAMIN E 600 UNIT  CAPS (VITAMIN E) 1 by mouth once daily MIRALAX  POWD (POLYETHYLENE GLYCOL 3350) as needed KAPIDEX 30 MG  CPDR (DEXLANSOPRAZOLE) Take one capsule by mouth 30 minutes before breakfast KAPIDEX 30 MG  CPDR (DEXLANSOPRAZOLE) Take one capsule by mouth 30 minutes before breakfast CARAFATE 1 GM/10ML  SUSP (SUCRALFATE) take 10 cc by mouth four times a day CARAFATE 1 GM/10ML  SUSP (SUCRALFATE) take 10 cc by mouth four times a day ZEGERID 40-1100  MG CAPS (OMEPRAZOLE-SODIUM BICARBONATE) Take 1 tab two times a day x 14 days then once daily ZEGERID 40-1100 MG CAPS (OMEPRAZOLE-SODIUM BICARBONATE) Take 1 tab two times a day x 14 days then once daily KEFLEX 500 MG CAPS (CEPHALEXIN) 1 by mouth two times a day ZEGERID 20-1100 MG CAPS (OMEPRAZOLE-SODIUM BICARBONATE) 1 by mouth once daily ZOSTAVAX [address] UNT/0.65ML SOLR (ZOSTER VACCINE LIVE) 1 ml  IM x1 FLEXERIL 10 MG TABS (CYCLOBENZAPRINE HCL) 1 by mouth three times a day VICODIN 5-500 MG TABS (HYDROCODONE-ACETAMINOPHEN) 1 by mouth every 6 hours as needed CARAFATE 1 GM/10ML  SUSP (SUCRALFATE) 10 cc by mouth qid       Phone Note Call from Patient Call back at Home Phone [phone]   Caller: Patient Call For: Dr. Jarold Motto Reason for Call: Talk to Nurse Summary of Call: Pt. said she had a "burning sensation" in her abdomen last night and then had Diarrhea and doesn't have anything to take for it. Initial call taken by: Karna Christmas,  October 12, 2009 10:26 AM  Follow-up for Phone Call        Pt states she had burning in epigastric area last pm, followed by diarrhea.  Req refill for Carafate.  Pt states she has not been taking Zegerid.  Pt instructed to resume Zegerid.  Refill will be given for carafate.  Pt instructed to report back in a few days if no better and OV will be scheduled.  Pt states she does feel better today. Follow-up  by: Ashok Cordia RN,  October 12, 2009 11:14 AM    New/Updated Medications: CARAFATE 1 GM/10ML  SUSP (SUCRALFATE) 10 cc by mouth qid Prescriptions: CARAFATE 1 GM/10ML  SUSP (SUCRALFATE) 10 cc by mouth qid  #14 oz x 1   Entered by:   Ashok Cordia RN   Authorized by:   Mardella Layman MD [facility]   Signed by:   Ashok Cordia RN on 10/12/2009   Method used:   Electronically to        [facility]* (retail)       [address]       [place], [place]  [address]       Ph: [phone] or [phone]       Fax:  [phone]   RxID:   [id number]

## 2010-10-22 NOTE — Assessment & Plan Note (Signed)
Summary: heart races @ time when lift things/kdc   Vital Signs:  Patient profile:   77 year old female Weight:      108 pounds O2 Sat:      96 % on Room air Pulse rate:   78 / minute Pulse rhythm:   regular BP sitting:   132 / 80  (left arm) Cuff size:   regular  Vitals Entered By: Army Fossa CMA (January 07, 2010 3:32 PM)  O2 Flow:  Room air CC: Pt states that her Heart races when she does any type of activity. She is very tired and SOB.   History of Present Illness: Pt here c/o heart racing with any exertion and SOB.  No CP.  Symptoms times several weeks.     Current Medications (verified): 1)  Celexa 40 Mg  Tabs (Citalopram Hydrobromide) .Marland Kitchen.. 1 By Mouth Once Daily 2)  Multivitamins   Tabs (Multiple Vitamin) .Marland Kitchen.. 1 By Mouth Once Daily 3)  Vitamin E 600 Unit  Caps (Vitamin E) .Marland Kitchen.. 1 By Mouth Once Daily 4)  Miralax  Powd (Polyethylene Glycol 3350) .... As Needed 5)  Zegerid 20-1100 Mg Caps (Omeprazole-Sodium Bicarbonate) .Marland Kitchen.. 1 By Mouth Once Daily 6)  Zostavax 91478 Unt/0.51ml Solr (Zoster Vaccine Live) .Marland Kitchen.. 1 Ml  Im X1 7)  Flexeril 10 Mg Tabs (Cyclobenzaprine Hcl) .Marland Kitchen.. 1 By Mouth Three Times A Day 8)  Vicodin 5-500 Mg Tabs (Hydrocodone-Acetaminophen) .Marland Kitchen.. 1 By Mouth Every 6 Hours As Needed 9)  Carafate 1 Gm/47ml  Susp (Sucralfate) .Marland Kitchen.. 10 Cc By Mouth Qid  Allergies: 1)  ! Indocin 2)  ! Codeine  Past History:  Past medical, surgical, family and social histories (including risk factors) reviewed for relevance to current acute and chronic problems.  Past Medical History: Reviewed history from 11/30/2008 and no changes required. Diverticulosis, universal, severe GERD Osteoporosis Transient ischemic attack, hx of Fractures- leg,wrist High Cholesterol Depression Gastritis  Past Surgical History: Reviewed history from 11/30/2008 and no changes required. Hysterectomy SBO with laparotomy, 1960s,  SBO with lysis of adhesions, 1993 ovarian cyst  removed Tonsillectomy Appendectomy  Family History: Reviewed history from 10/19/2008 and no changes required. Family History Stroke No FH of Colon Cancer: Family History of Heart Disease: aunt  Social History: Reviewed history from 10/19/2008 and no changes required. Former Smoker Alcohol use-no Drug use-no Patient does not get regular exercise.   Review of Systems      See HPI  Physical Exam  General:  Well-developed,well-nourished,in no acute distress; alert,appropriate and cooperative throughout examination Lungs:  Normal respiratory effort, chest expands symmetrically. Lungs are clear to auscultation, no crackles or wheezes. Heart:  normal rate and no murmur.   Extremities:  No clubbing, cyanosis, edema, or deformity noted with normal full range of motion of all joints.   Psych:  Oriented X3 and subdued.     Impression & Recommendations:  Problem # 1:  DYSPNEA ON EXERTION (ICD-786.09) check labs Orders: Echo Referral (Echo) EKG w/ Interpretation (93000)  Recommended fluid and salt restriction.   Problem # 2:  FATIGUE (ICD-780.79) check labs Orders: Venipuncture (29562) TLB-B12 + Folate Pnl (13086_57846-N62/XBM) TLB-BMP (Basic Metabolic Panel-BMET) (80048-METABOL) TLB-CBC Platelet - w/Differential (85025-CBCD) TLB-TSH (Thyroid Stimulating Hormone) (84443-TSH) TLB-T3, Free (Triiodothyronine) (84481-T3FREE) TLB-T4 (Thyrox), Free [phone]) EKG w/ Interpretation (93000)  Complete Medication List: 1)  Celexa 40 Mg Tabs (Citalopram hydrobromide) .Marland Kitchen.. 1 by mouth once daily 2)  Multivitamins Tabs (Multiple vitamin) .Marland Kitchen.. 1 by mouth once daily 3)  Vitamin E 600 Unit Caps (  Vitamin e) .Marland Kitchen.. 1 by mouth once daily 4)  Miralax Powd (Polyethylene glycol 3350) .... As needed 5)  Zegerid 20-1100 Mg Caps (Omeprazole-sodium bicarbonate) .Marland Kitchen.. 1 by mouth once daily 6)  Zostavax 04540 Unt/0.60ml Solr (Zoster vaccine live) .Marland Kitchen.. 1 ml  im x1 7)  Flexeril 10 Mg Tabs  (Cyclobenzaprine hcl) .Marland Kitchen.. 1 by mouth three times a day 8)  Vicodin 5-500 Mg Tabs (Hydrocodone-acetaminophen) .Marland Kitchen.. 1 by mouth every 6 hours as needed 9)  Carafate 1 Gm/36ml Susp (Sucralfate) .Marland Kitchen.. 10 cc by mouth qid   EKG  Procedure date:  01/07/2010  Findings:      Normal sinus rhythm with rate of:  66 bpm

## 2010-10-22 NOTE — Assessment & Plan Note (Signed)
Summary: b    History of Present Illness Visit Type: Initial Consult Primary GI MD: Sheryn Bison MD FACP FAGA Primary Provider: Loreen Freud DO Chief Complaint: unable to eat burning in stomach after talomg Prednisone History of Present Illness:   Patient followed by Dr. Jarold Motto for GERD. She has had upper and lower endoscopies in the past. Last March of this year for epigastric pain. Labs and U/S were normal. Patient recently hurt her arm, PCP gave her Prednisone. After a few doses of Prednisone her stomach began burning so she stopped treatment. Has had problems with same discomfort before.  Everything she eats now causes non-radiating epigastric pain. No nausea or vomiting. Bowels unchanged. Her acid reflux is under control. She doesn't take any NSAIDS.   GI Review of Systems    Reports abdominal pain and  loss of appetite.     Location of  Abdominal pain: epigastrium.    Denies acid reflux, belching, bloating, chest pain, dysphagia with liquids, dysphagia with solids, heartburn, nausea, vomiting, vomiting blood, weight loss, and  weight gain.        Denies anal fissure, black tarry stools, change in bowel habit, constipation, diarrhea, diverticulosis, fecal incontinence, heme positive stool, hemorrhoids, irritable bowel syndrome, jaundice, light color stool, liver problems, rectal bleeding, and  rectal pain.    Current Medications (verified): 1)  Celexa 40 Mg  Tabs (Citalopram Hydrobromide) .Marland Kitchen.. 1 By Mouth Once Daily 2)  Multivitamins   Tabs (Multiple Vitamin) .Marland Kitchen.. 1 By Mouth Once Daily 3)  Vitamin E 600 Unit  Caps (Vitamin E) .Marland Kitchen.. 1 By Mouth Once Daily 4)  Miralax  Powd (Polyethylene Glycol 3350) .... As Needed 5)  Zegerid 20-1100 Mg Caps (Omeprazole-Sodium Bicarbonate) .Marland Kitchen.. 1 By Mouth Once Daily 6)  Vicodin 5-500 Mg Tabs (Hydrocodone-Acetaminophen) .Marland Kitchen.. 1 By Mouth Every 6 Hours As Needed 7)  Carafate 1 Gm/60ml  Susp (Sucralfate) .Marland Kitchen.. 10 Cc By Mouth Qid 8)  Ultram 50 Mg Tabs  (Tramadol Hcl) .... 1/2 -1 By Mouth Every 6 Hours As Needed Pain  Allergies (verified): 1)  ! Indocin 2)  ! Codeine  Past History:  Past Medical History: Reviewed history from 11/30/2008 and no changes required. Diverticulosis, universal, severe GERD Osteoporosis Transient ischemic attack, hx of Fractures- leg,wrist High Cholesterol Depression Gastritis  Past Surgical History: Reviewed history from 11/30/2008 and no changes required. Hysterectomy SBO with laparotomy, 1960s,  SBO with lysis of adhesions, 1993 ovarian cyst removed Tonsillectomy Appendectomy  Family History: Reviewed history from 10/19/2008 and no changes required. Family History Stroke No FH of Colon Cancer: Family History of Heart Disease: aunt  Social History: Reviewed history from 10/19/2008 and no changes required. Former Smoker Alcohol use-no Drug use-no Patient does not get regular exercise.   Review of Systems       The patient complains of allergy/sinus, cough, and fatigue.  The patient denies anemia, anxiety-new, arthritis/joint pain, back pain, blood in urine, breast changes/lumps, change in vision, confusion, coughing up blood, depression-new, fainting, fever, headaches-new, hearing problems, heart murmur, heart rhythm changes, itching, muscle pains/cramps, night sweats, nosebleeds, shortness of breath, skin rash, sleeping problems, sore throat, swelling of feet/legs, swollen lymph glands, thirst - excessive, urination - excessive, urination changes/pain, urine leakage, vision changes, and voice change.    Vital Signs:  Patient profile:   77 year old female Height:      64 inches Weight:      105 pounds BMI:     18.09 Pulse rate:   80 / minute  Pulse rhythm:   regular BP sitting:   108 / 60  (left arm)  Vitals Entered By: Milford Cage NCMA (May 10, 2010 3:49 PM)  Physical Exam  General:  Well developed, well nourished, no acute distress. Head:  Normocephalic and  atraumatic. Neck:  no obvious masses  Lungs:  Clear throughout to auscultation. Heart:  RRR.  Abdomen:  Abdomen soft, nontender, nondistended. No obvious masses or hepatomegaly.Normal bowel sounds.  Msk:  Symmetrical with no gross deformities. Normal posture. Extremities:  No palmar erythema, no edema.  Neurologic:  Alert and  oriented x4;  grossly normal neurologically. Skin:  Intact without significant lesions or rashes. Cervical Nodes:  No significant cervical adenopathy. Psych:  Alert and cooperative. Normal mood and affect.   Impression & Recommendations:  Problem # 1:  GERD (ICD-530.81) Assessment Comment Only No regurgitation or pyrosis on PPI.  Problem # 2:  ABDOMINAL PAIN-EPIGASTRIC (ICD-789.06) She has had this pain before with normal U/S and labs. Pain recurred after taking Prednisone. Not on NSAIDS. Abdominal exam benign. Somewhat confused about which medications she is taking at home but feels certain she is on PPI. Patient restarted herself on Carafate (she thinks) two days ago. Continue QID Carafate. Increase PPI to twice daily until seen by Dr. Jarold Motto in Sept.  Will give samples of Nexium due to insurance paying for it twice daily.   Patient Instructions: 1)  We have given you GERD and Reflux brochures.  2)  We made you an appointment with Dr Jarold Motto for 05-28-2010 at 2:30 PM.  3)  We have given you samples of Nexium.  We  sent a prescription for Omeprazole to [facility].  4)  Take 1 omeprazole 30 min before breakfast and take  1 Nexium samples 30 min before dinner.  5)  Copy sent to : Loreen Freud, DO 6)  The medication list was reviewed and reconciled.  All changed / newly prescribed medications were explained.  A complete medication list was provided to the patient / caregiver. Prescriptions: OMEPRAZOLE 20 MG CPDR (OMEPRAZOLE) Take 1 capsule 30 min prior to breakfast  #30 x 6   Entered by:   Lowry Ram NCMA   Authorized by:   Willette Cluster NP   Signed by:    Lowry Ram NCMA on 05/10/2010   Method used:   Electronically to        [facility] [address] [facility] (retail)       [address]       [place], [place]  [address]       Ph: [phone] or [phone]       Fax: [phone]   RxID:   [id number]

## 2010-10-22 NOTE — Letter (Signed)
Summary: [facility]  [facility]   Imported By: Lanelle Bal 04/19/2010 11:58:49  _____________________________________________________________________  External Attachment:    Type:   Image     Comment:   External Document

## 2010-10-24 NOTE — Assessment & Plan Note (Signed)
Summary: rto 1 month/cbs - rescd cbs   Vital Signs:  Patient profile:   77 year old female Weight:      103.6 pounds Pulse rate:   72 / minute Pulse rhythm:   regular BP sitting:   132 / 74  (left arm) Cuff size:   regular  Vitals Entered By: Almeta Monas CMA Duncan Dull) (September 06, 2010 3:36 PM) CC: 1 mo med f/u   History of Present Illness: Pt is here with daughter for 1 mon f/u.  Pt is doing well with celexa and aricept.  No new complaints.  As pt was leaving room she mentioned a growth on her Left ear she wanted me to see------It was a tick.   Current Medications (verified): 1)  Celexa 40 Mg Tabs (Citalopram Hydrobromide) .Marland Kitchen.. 1 By Mouth Once Daily 2)  Aricept 10 Mg Tabs (Donepezil Hcl) .Marland Kitchen.. 1 By Mouth Once Daily  Allergies (verified): 1)  ! Indocin 2)  ! Codeine  Past History:  Past Medical History: Last updated: 11/30/2008 Diverticulosis, universal, severe GERD Osteoporosis Transient ischemic attack, hx of Fractures- leg,wrist High Cholesterol Depression Gastritis  Past Surgical History: Last updated: 11/30/2008 Hysterectomy SBO with laparotomy, 1960s,  SBO with lysis of adhesions, 1993 ovarian cyst removed Tonsillectomy Appendectomy  Family History: Last updated: 10/19/2008 Family History Stroke No FH of Colon Cancer: Family History of Heart Disease: aunt  Social History: Last updated: 10/19/2008 Former Smoker Alcohol use-no Drug use-no Patient does not get regular exercise.   Risk Factors: Alcohol Use: 0 (03/09/2009) Caffeine Use: 0 (03/09/2009) Exercise: no (03/09/2009)  Risk Factors: Smoking Status: never (03/09/2009) Passive Smoke Exposure: no (08/02/2007)  Family History: Reviewed history from 10/19/2008 and no changes required. Family History Stroke No FH of Colon Cancer: Family History of Heart Disease: aunt  Social History: Reviewed history from 10/19/2008 and no changes required. Former Smoker Alcohol use-no Drug  use-no Patient does not get regular exercise.   Review of Systems      See HPI  Physical Exam  General:  Well-developed,well-nourished,in no acute distress; alert,appropriate and cooperative throughout examination Ears:  L ear---+ tick back of ear-removed easily so tick had not been there long. Psych:  normally interactive, good eye contact, not anxious appearing, and not depressed appearing.  Memory improved.   Impression & Recommendations:  Problem # 1:  MEMORY LOSS (ICD-780.93) Assessment Improved aricept 10 mg once daily per Dr Sandria Manly  Problem # 2:  DEPRESSION (ICD-311) Assessment: Improved  Her updated medication list for this problem includes:    Celexa 40 Mg Tabs (Citalopram hydrobromide) .Marland Kitchen... 1 by mouth once daily  Problem # 3:  TICK BITE (ICD-E906.4) abx oint to area tick removed easily and destroyed---nothing left behind rto prn  Complete Medication List: 1)  Celexa 40 Mg Tabs (Citalopram hydrobromide) .Marland Kitchen.. 1 by mouth once daily 2)  Aricept 10 Mg Tabs (Donepezil hcl) .Marland Kitchen.. 1 by mouth once daily  Patient Instructions: 1)  Please schedule a follow-up appointment in 3 months .  Prescriptions: CELEXA 40 MG TABS (CITALOPRAM HYDROBROMIDE) 1 by mouth once daily  #30 x 2   Entered and Authorized by:   Loreen Freud DO   Signed by:   Loreen Freud DO on 09/06/2010   Method used:   Historical   RxID:   [id number]    Orders Added: 1)  Est. Patient Level IV [56213]

## 2010-12-19 ENCOUNTER — Ambulatory Visit: Payer: Self-pay | Admitting: Family Medicine

## 2010-12-19 ENCOUNTER — Encounter: Payer: Self-pay | Admitting: Family Medicine

## 2010-12-20 ENCOUNTER — Ambulatory Visit (INDEPENDENT_AMBULATORY_CARE_PROVIDER_SITE_OTHER): Payer: Medicare Other | Admitting: Family Medicine

## 2010-12-20 ENCOUNTER — Encounter: Payer: Self-pay | Admitting: Family Medicine

## 2010-12-20 DIAGNOSIS — F411 Generalized anxiety disorder: Secondary | ICD-10-CM

## 2010-12-20 DIAGNOSIS — R21 Rash and other nonspecific skin eruption: Secondary | ICD-10-CM

## 2010-12-20 DIAGNOSIS — R0609 Other forms of dyspnea: Secondary | ICD-10-CM

## 2010-12-20 DIAGNOSIS — R0989 Other specified symptoms and signs involving the circulatory and respiratory systems: Secondary | ICD-10-CM

## 2010-12-20 DIAGNOSIS — R079 Chest pain, unspecified: Secondary | ICD-10-CM

## 2010-12-20 MED ORDER — MOMETASONE FUROATE 0.1 % EX CREA: TOPICAL_CREAM | Freq: Every day | CUTANEOUS | Status: DC

## 2010-12-20 MED ORDER — ALPRAZOLAM 0.25 MG PO TABS: 0.2500 mg | ORAL_TABLET | Freq: Three times a day (TID) | ORAL | Status: DC | PRN

## 2010-12-20 MED ORDER — FLUTICASONE-SALMETEROL 250-50 MCG/DOSE IN AEPB: 1.0000 | INHALATION_SPRAY | Freq: Two times a day (BID) | RESPIRATORY_TRACT | Status: DC

## 2010-12-20 NOTE — Progress Notes (Signed)
Spoke with grand daughter and I advised of the results, I made her aware of Advair 250-50 and she stated she would not be able to come in for sample and wanted it called to the pharmacy--[facility] on [address]. She also stated that [name] was going to send in a cream. Per Dr. Laury Axon ok to fax in Elocon Cream and to mix 50/50 with Eucerin Cream.  VM left advising RX's sent to pharmacy and directions on Elocon and Eucerin      KP

## 2010-12-20 NOTE — Progress Notes (Signed)
  Subjective:    Patient ID: Ashley Burnett, female    DOB: 23-Feb-1918, 77 y.o.   MRN: [id number]  HPI Pt here with daughter c/o SOB with exertion.  Pt is c/o congestion in ears and nose.  NO otc meds. Chest pain only with increased with anxiety.   Symptoms of SOB has been going on for months.  Pt recently has been taking celexa regularly and aricept.     Review of Systems  All other systems reviewed and are negative.       Objective:   Physical Exam  Constitutional: She is oriented to person, place, and time. She appears well-developed and well-nourished. No distress.  HENT:  Right Ear: External ear normal.  Left Ear: External ear normal.  Nose: Nose normal.  Mouth/Throat: Oropharynx is clear and moist.  Neck: Normal range of motion. Neck supple.  Cardiovascular: Normal rate.   Murmur heard. Pulmonary/Chest: Effort normal and breath sounds normal.  Musculoskeletal: She exhibits no edema.  Neurological: She is alert and oriented to person, place, and time.  Skin: Skin is warm and dry.  Psychiatric: She has a normal mood and affect.          Assessment & Plan:

## 2010-12-20 NOTE — Assessment & Plan Note (Addendum)
con't celexa Xanax 0.25 mg po qhs --- granddaughter will keep track of it

## 2010-12-21 DIAGNOSIS — R21 Rash and other nonspecific skin eruption: Secondary | ICD-10-CM | POA: Insufficient documentation

## 2010-12-21 NOTE — Assessment & Plan Note (Addendum)
No change from previous pft done Start Advair 250/50  1 inh bid

## 2010-12-21 NOTE — Assessment & Plan Note (Signed)
Skin is very dry Use elocon mixed 50/50 with eucerin cream Call or rto prn

## 2011-01-07 ENCOUNTER — Other Ambulatory Visit: Payer: Self-pay | Admitting: Family Medicine

## 2011-01-07 NOTE — Telephone Encounter (Signed)
Rx sent 

## 2011-01-07 NOTE — Telephone Encounter (Signed)
Last seen and filled 09/06/10 # 30 with 2 refills     Please advise    KP

## 2011-02-04 NOTE — Assessment & Plan Note (Signed)
[facility]                                 ON-CALL NOTE   NAME:[name], [name]                           MRN:          [id number]  DATE:02/13/2007                            DOB:          [birth year]    Phone number:  [phone]  Patient of Lelon Perla, DO  The phone call was at 6:08 a.m. on May 24.   Ashley Burnett went out to pick up the paper and slipped and fell and hit  her head.  She has a big goose egg over her eye and she is very  concerned about that.  She did not lose consciousness and is having no  visual changes, no dizziness, and no headache.  No other neurologic  problem.  She wonders whether she should just use ice or have someone  take a look at it.   PLAN:  I told her she should definitely put ice on it but if she has any  of the aforementioned neurologic symptoms, she should have someone bring  her to the emergency room for evaluation.     Karie Schwalbe, MD  Electronically Signed    RIL/MedQ  DD: 02/13/2007  DT: 02/13/2007  Job #: [id number]   cc:   Lelon Perla, DO

## 2011-02-04 NOTE — Assessment & Plan Note (Signed)
[facility]                         GASTROENTEROLOGY OFFICE NOTE   NAME:Zheng, TAESHA GOODELL                         MRN:          [id number]  DATE:06/01/2007                            DOB:          January 19, 1918    Lempi is an 77 year old white female I recently treated for diverticulitis  in April 2008.  She has a long history of multiple intestinal adhesions,  several operations for complications thereof, and has had previous  appendectomy, hysterectomy, etc.  She denies lower GI complaints at this  time but now describes painful swallowing, mostly in the right lateral  paraxiphoid area, without true dysphagia.  She does describe belching  and burping but denies dysphagia.  She denies recent antibiotic exposure  since she was treated in April of this past year, and on reviewing her  record I cannot see where she has had recurrent candida infections.  She  did have an endoscopic exam in November 1994 and had a submucosal nodule  in her stomach with chronic gastritis.  I do not think she has had  endoscopic exam since that time, but she has had multiple colonoscopy  exams.  Review of the pathology specimen from 1994 showed some focal  positive stains for Helicobacter pylori.  I do not think she was treated  for H. pylori infection at that time.   She denies any hepatobiliary complaints at this time such as clay-  colored stools, dark urine, icterus, fever or chills.  Last CT scan of  the abdomen in November 2003 showed normal gallbladder exam and I do not  think she has had ultrasound since 2002 which showed a tiny gallbladder  polyp but otherwise was normal.  Her only medications at this time are  multivitamins and Os-Cal.   She is a somewhat thin, slightly depressed-appearing elderly white  female in no acute distress.  She weighs 106 pounds which is her normal  weight, and blood pressure is 124/66.  Pulse was 72 and normal.  I could  not appreciate stigmata  of chronic liver disease.  Her chest was clear  to percussion and auscultation.  She was in a regular rhythm without  significant murmurs, gallops or rubs.  I could not appreciate  hepatosplenomegaly, abdominal masses or tenderness.  Bowel sounds were  normal.  Peripheral extremities were unremarkable.  Mental status was  normal.  Rectal exam was deferred.  There was no tenderness along the  anterior abdominal wall or other abnormalities, or evidence of a skin  rash.   ASSESSMENT:  Ms. Cozza symptoms certainly sound like she has probable  erosive esophagitis versus candida esophagitis.  She gives a history of  recurrent herpetic infections and herpetic esophagitis is certainly a  possibility, although I think this is unlikely without clinical  immunosuppression problems.   RECOMMENDATIONS:  1. Outpatient endoscopic exam.  2. Begin daily PPI therapy with Carafate suspension one teaspoon after      meals and at bedtime.  3. Consider further workup depending on clinical course and endoscopic      exam.  4. Continue high-fiber diet and  diverticulosis management as      previously outlined.     Vania Rea. Jarold Motto, MD, Caleen Essex, FAGA  Electronically Signed    DRP/MedQ  DD: 06/01/2007  DT: 06/02/2007  Job #: [id number]   cc:   Currie Paris, M.D.  Titus Dubin. Alwyn Ren, MD,FACP,FCCP

## 2011-02-04 NOTE — Assessment & Plan Note (Signed)
[facility]                         GASTROENTEROLOGY OFFICE NOTE   NAME:[name], [name]                         MRN:          [id number]  DATE:06/15/2007                            DOB:          11/03/1917    Ashley Burnett is much better symptomatically in terms of dysphagia and  odynophagia. Her endoscopy is clearly unremarkable except for acid-  reflux changes and peptic stricture that was dilated. She has been on  Carafate suspension 4 times a day. She wants to restart her aspirin  therapy at this time.   I have asked her to continue Prevacid 30 mg a day, to use Carafate on a  p.r.n. basis. I have given her permission to restart her aspirin. Will  follow her along with Dr. Loreen Freud.     Vania Rea. Jarold Motto, MD, Caleen Essex, FAGA  Electronically Signed    DRP/MedQ  DD: 06/15/2007  DT: 06/16/2007  Job #: [id number]   cc:   Lelon Perla, DO

## 2011-02-07 NOTE — Discharge Summary (Signed)
[place]. [facility]  Patient:    Ashley Burnett, Ashley Burnett Visit Number: [id number] MRN: [id number]          Service Type: MED Location: [phone] Attending Physician:  Starr Sinclair Dictated by:   Dianah Field, P.A.-C Admit Date:  08/14/2001 Discharge Date: 08/19/2001   CC:         Vania Rea. Jarold Motto, M.D.   Discharge Summary  DATE OF BIRTH:  12-Feb-2018  ADMISSION DIAGNOSES:  1. Acute diverticulitis with small area of localized perforation in the     sigmoid region.  Prior diverticulitis, has been hospitalized for treatment     of this in the past.  2. History of subacute right upper quadrant pain, rule out cholelithiasis,     rule out gastritis, rule out peptic ulcer disease.  3. Hyponatremia.  4. Status post multiple abdominal and pelvic surgeries, including     hysterectomy, ovarian cystectomy twice, appendectomy, and laparotomy for     small bowel obstruction and lysis of adhesions in 1995.  5. History of small bowel obstruction.  Status post laparotomy with lysis of     adhesions in 1995 by Dr. Jamey Ripa.  6. History of gout.  7. Status post removal of skin cancer, two of these have been found on her     face.  8. Spinal pain, particularly of the cervical spine, probably secondary to     degenerative joint disease.  9. Mitral valve prolapse. 10. G1, P1 with vaginal delivery. 11. Status post bunionectomy.  DISCHARGE DIAGNOSES: 1. Acute diverticulitis with localized abscess, symptoms much improved by the    time of discharge. 2. Musculoskeletal neck pain.  No evidence for nerve impingement. 3. Hyponatremia, resolved. 4. Right upper quadrant pain.  Ultrasound of the gallbladder during this    admission revealed an incidental gallbladder polyp. 5. Rule out acid peptic disease, i.e. peptic ulcer disease and/or    gastroesophageal reflux disease.  The patient continues on proton pump    inhibitor, and is to undergo upper endoscopy during  the second week of    December with Dr. Jarold Motto.  CONSULTATIONS:  None.  PROCEDURES:  None.  HISTORY OF PRESENT ILLNESS:  [name] is an 77 year old white female who generally is in pretty good health.  She does have an extensive history of abdominal and pelvic surgeries and history of small bowel obstruction.  She has known diverticulosis per information gardnered from screening colonoscopy performed about 3 years ago by Dr. Jarold Motto.  She had been seen by Dr. Jarold Motto about two weeks prior to this admission because of burning right upper quadrant pain.  Dr. Jarold Motto felt that her symptoms may be secondary to reflux disease, and started her on b.i.d. Protonix.  He also scheduled her for an upper endoscopy during the second week of November.  The right upper quadrant pain has since subsided, but still is a bit persistent at low levels.  On the day of admission the patient developed initially diffuse abdominal pain, but ultimately the pain localized into the left abdomen.  It was quite severe, and she presented to [facility].  She had an unremarkable, non-melenic, or bloody bowel movement that morning.  She was not nauseous or having emesis.  On examination in the ER, she had fairly exquisite tenderness in the left lower quadrant, and there was some early rebound with palpation, but no guarding.  A contrasted CT of the abdomen and pelvis showed inflammatory process  in the descending colon with diffuse diverticulosis involving descending and sigmoid colon.  There was a tiny focus of extraluminal air within the region of inflammation suggestive of localized abscess perforation.  The patient does have a prior history of diverticulitis, some of these requiring hospitalization.  The patients vital signs were stable, and she was afebrile.  Her white blood cell count was elevated at 13.1.  Dr. Russella Dar admitted her for antibiotic therapy, bowel rest, and  symptomatic relief of her acute diverticulitis.  LABORATORY DATA:  White blood cell count initially 13.1, corrected later to 9.7.  Hemoglobin ranged from 15 down to 12.  Hematocrit went from 43 down to 34.9.  Platelets 315,000.  On differential there was a left shift with 81% neutrophils, and 10.6 absolute neutrophils.  Lymphocytes within normal limits at 13.7, monocytes normal at 5%, eosinophils normal at 0%, basophils normal at 1%.  Sodium level slightly low at 131, later corrected to 135.  Potassium normal at 4.  Chloride initially low at 95, corrected to 108.  Glucose ranged from 101 up to 144.  BUN low at 4, creatinine low at 0.4.  Calcium low at 7.9 to 8.1.  Total bilirubin 0.2, alkaline phosphatase 66, AST 24, ALT 18, albumin 3.4, lipase 17.  Urinalysis was negative for nitrites, leukocyte esterase. She did have ketones, as well as a small amount of bilirubin present.  IMAGING STUDIES:  This showed focal inflammatory process of the descending colon associated with colonic thickening and significant surrounding inflammation.  A tiny focus of extraluminal air was suspected lateral to the descending colon.  Findings most likely were secondary to diverticulitis. Radiology did suggest further workup warranted to exclude underlying mass or inflammatory bowel disease.  Focal abscess was not seen.  Pelvic CT showed sigmoid diverticulosis, and again inflammatory process involving the descending colon extending into the upper left pelvis. Ultrasound of the abdomen showed a tiny gallbladder polyp, otherwise examination was normal with 5 mm common bile duct.  Pancreas and kidneys were within normal limits.  HOSPITAL COURSE:  The patient was admitted to an unmonitored hospital bed. She was started in the emergency room on IV Cipro and Flagyl, along with bowel rest consisting of ice chips and sips of water with her medications only.  By hospital day #2, she was still having significant left  lower quadrant pain, so the antibiotics along with bowel rest was continued.   Dr. Russella Dar ordered an abdominal ultrasound to evaluate the patients prior and present complaints of right upper quadrant discomfort.  This test findings were as noted above.  Ultimately, there was no explanation for her right upper quadrant pain.  By hospital day #3, she was again having significant left lower quadrant pain, however, it had become more focal in its expression, and more focal on exam.  She was also complaining of a "crick" in her neck.  She suffers from a lot of neck stiffness and pain.  She was given a heating pad for this, along with some Flexeril which was helpful.  However, Darvocet-N 100 just made her feel nauseated, and did not provide much pain relief.  By hospital day #4 and 5, the patients abdominal pain improved further. Ultimately, her diet was advanced from clear liquids up to full liquids which she was tolerating.  She was so improved by August 18, 2001, that Dr. Juanda Chance felt that she was stable for discharge home, and was to advance her diet further to that of low residue diet at home.  She was given prescriptions for p.o. Cipro to continue a course of antibiotics at home.  She was to call Dr. Maris Berger office for office visit at first available appointment.  However, she did still have the appointment which she was to keep for upper endoscopy in December.  CONDITION ON DISCHARGE:  Stable, improved.  DISCHARGE MEDICATIONS: 1. Cipro 250 mg one p.o. b.i.d.  She was given one weeks course to complete    of Cipro. 2. Protonix 40 mg one p.o. q.d. 3. Premarin, dose not known, one p.o. q.d.; the patient does say that she    takes "the lowest dose."  (She was treated with a 0.3 mg dose of Premarin    during this hospitalization). 4. Multivitamin one p.o. q.d. 5. Vitamin E one p.o. q.d. 6. Vitamin C one p.o. q.d. 7. Vitamin D one p.o. q.d. Dictated by:   Dianah Field,  P.A.-C Attending Physician:  Starr Sinclair DD:  08/25/01 TD:  08/25/01 Job: [id number]

## 2011-02-07 NOTE — Assessment & Plan Note (Signed)
[facility]                         GASTROENTEROLOGY OFFICE NOTE   NAME:Ashley Burnett, Ashley Burnett                         MRN:          [id number]  DATE:12/22/2006                            DOB:          04/08/1918    Ms. Guthridge is an 77 year old white female who has known diverticulosis  with recurrent diverticulitis.  She has also had previous appendectomy,  hysterectomy, intestinal adhesions and lysis of adhesions on several  occasions.  She watches her diet closely and has not had any  diverticulitis in several years but for the last 3 days has had left  lower quadrant pain with gas and bloating but no change in bowel habits,  fever, chills, nausea and vomiting.  She does not take her temperature.  She has not had melena or hematochezia, any upper GI or hepatobiliary  problems.  She did take amoxicillin a month ago and developed diarrhea  after 3 days and discontinued this medication.  She takes Zoloft 50 mg a  day with a variety of multivitamins.   Her weight is 107.4 pounds and blood pressure is 112/68.  Pulse was  78  and regular.  She is a non-toxic, healthy-appearing, white female in no  distress.  ABDOMINAL:  Shows no hepatosplenomegaly, masses, or distention.  There  is tenderness to deep palpation in the left lower quadrant without  rebound.  Bowel sounds are active.  RECTAL:  Deferred.   ASSESSMENT:  I think Ms. Dejoy has diverticulitis and should respond to  conventional therapy.   RECOMMENDATIONS:  1. Check CBC, sed rate.  2. Low fiber diet as tolerated.  3. Cipro 500 mg twice a day along with metronidazole 500 mg twice a      day for 10 days.  4. Align probiotic therapy.  5. Followup in 7-10 days.  She may need CT scan depending on her lab      data clinical course.     Vania Rea. Jarold Motto, MD, Caleen Essex, FAGA  Electronically Signed    DRP/MedQ  DD: 12/22/2006  DT: 12/22/2006  Job #: [id number]   cc:   Lelon Perla, DO

## 2011-02-07 NOTE — Consult Note (Signed)
[place]. [facility]  Patient:    Ashley Burnett, Ashley Burnett                         MRN: [id number] Proc. Date: 10/02/00 Adm. Date:  [id number] Attending:  Doug Sou CC:         Titus Dubin. Alwyn Ren, M.D. [facility]   Consultation Report  REASON FOR CONSULTATION:  I was asked by Dr. Doug Sou, the emergency room physician, to evaluate Ms. Bing Ree for chest discomfort.  HISTORY OF PRESENT ILLNESS:  For the past two to three days she has had intermittent spontaneous mid-substernal chest pain described as a sharp brief-type of discomfort.   It is not associated with deep breathing, cough, change in position, and is not associated with exertion.  Her cardiac history is significant for a mitral valve prolapse.  She has a history of hypertension.  She called Dr. Tera Mater. Clent Ridges today, and he asked her to come into the emergency room for an evaluation.  ALLERGIES:  She is intolerant of CODEINE, INDOCIN, AND MORPHINE SULFATE.  CURRENT MEDICATIONS:  Premarin only.  PHYSICAL EXAMINATION:  VITAL SIGNS:  Blood pressure 186/76, pulse 76, in sinus rhythm, respirations 14 and unlabored, temperature 96.2 degrees.  GENERAL:  She is in no acute distress.  NECK:  No jugular venous distention.  Carotid upstrokes equal bilaterally without bruits.  No thyromegaly.  Trachea midline.  CHEST:  There is exquisite tenderness even with my stethoscope along the left sternal border, midway down.  HEART:  She has a normal S1 and S2.  Soft click.  No murmur.  LUNGS:  Clear to auscultation, with no rub.  ABDOMEN:  Soft, good bowel sounds.  No epigastric tenderness.  EXTREMITIES:  No cyanosis, clubbing, or edema.  Pulses intact.  Electrocardiogram shows a normal sinus rhythm with no ST segment changes.  Chest x-ray shows a normal-sized heart with no acute disease.  Troponin, CPK, and CBC are all normal.  An ECH-stat was also within normal limits except for a sodium of  130.  ASSESSMENT:  Noncardiac chest pain.  The patient most likely has musculoskeletal pain, or it could be related to mitral valve prolapse.  PLAN:  Vioxx 25 mg p.o. now in the emergency room, and then for seven days. One refill is given.  The patient was asked to contact Dr. Titus Dubin. Hopper if her pain recurs.  Warm moist heat to the chest t.i.d. during this time. DD:  10/02/00 TD:  10/03/00 Job: [id number]

## 2011-02-07 NOTE — H&P (Signed)
[place]. [facility]  Patient:    Ashley Burnett, Ashley Burnett Visit Number: [id number] MRN: [id number]          Service Type: MED Location: [phone] Attending Physician:  Starr Sinclair Dictated by:   Dianah Field, P.A. Admit Date:  08/14/2001                           History and Physical  DATE OF BIRTH:  1918/04/23  PRIMARY CARE PHYSICIAN:  Dr. Marga Melnick.  GASTROINTESTINAL PHYSICIAN:  Dr. Sheryn Bison.  CHIEF COMPLAINT:  Left lower quadrant pain.  HISTORY OF PRESENT ILLNESS:  The patient is an 77 year old white female who is generally in very good health with the exception of history of abdominal surgeries and small-bowel obstruction requiring surgery in 1995.  She does have known diverticulosis on a screening colonoscopy performed approximately three years ago by Dr. Jarold Motto.  She was seen in Dr. Maris Berger office approximately two weeks ago for right upper quadrant pain which was burning in quality.  Dr. Jarold Motto felt that she may have her symptoms secondary to reflux disease and started her on b.i.d. Protonix.  She was also scheduled for an upper endoscopy which is to be performed in the second week of December. The burning right upper quadrant pain has subsided to some extent, though it still bothers her a little bit.  On the day of admission the patient developed diffuse abdominal pain at about 5:30 in the morning.  She thought this was gas pain.  It quickly localized into the left abdomen and became increasingly severe, prompting her visit to the [facility] Emergency Room.  She denies nausea or vomiting.  She had had a bowel movement that morning which was unremarkable and was not bloody or melenic.  She was evaluated in the emergency room, and left lower quadrant tenderness was noted on exam.  A contrasted CT scan of the abdomen and pelvis showed inflammatory process in the descending colon with  diffuse diverticulosis of the descending and sigmoid colon.  There was a tiny focus of extraluminal air in the region of the inflammation.  The patient has had several episodes of prior diverticulitis, some of these requiring hospitalization.  This past August, the patient had similar left lower quadrant pain and was treated as an outpatient with antibiotics.  However, she recalls that she may not have finished her entire course of antibiotics at that time.  In any event, the symptoms resolved, and she has not particularly been bothered with lingering left-sided abdominal pain or weakness or fatigue. She was ultimately evaluated by Dr. Russella Dar in the emergency room, and he admitted her for treatment of acute diverticulitis with microperforation.  PAST MEDICAL AND SURGICAL HISTORY:  1. Status post laparotomy for small-bowel obstruction secondary to adhesions     in 1995, by Dr. Jamey Ripa.  2. Status post hysterectomy.  3. Status post ovarian cystectomy twice.  4. Status post appendectomy.  5. Status post tonsillectomy/adenoidectomy.  6. Status post bunionectomy.  7. G1, P1, with vaginal delivery.  8. History of gout, most recent flare this summer in the right wrist.  9. Mitral valve prolapse. 10. Spinal pain, particularly in the cervical spine, probable degenerative     joint disease. 11. Status post removal of skin cancers.  She has had two of these removed     from her face.  ALLERGIES:  MORPHINE SULFATE causes nausea.  INDOCIN has caused severe headache in the past.  COLCHICINE in large quantities has caused diarrhea. SULFA induced swelling.  With CODEINE, she says she feels "weird."  MEDICATIONS:  1. Protonix 40 mg p.o. b.i.d.  2. Premarin, dose not known, though she says she takes "the lowest dose,"     1 p.o. q.d.  3. Multivitamin 1 p.o. q.d.  4. Vitamin E 1 p.o. q.d.  5. Vitamin C 1 p.o. q.d.  6. Vitamin D 1 p.o. q.d.  SOCIAL HISTORY:  The patient lives in her own home.  She  is widowed.  She is quite active and still drives.  However, she does not get a lot of physical exercise.  She smoked for maybe 10 years in total, anywhere from a few to several cigarettes a day.  Alcohol intake is sporadic and generally is about one-half glass of wine and, in much less frequency, a can or less of beer.  FAMILY HISTORY:  Her mother died of a melanoma at age 14.  Her father had tuberculosis as a younger man in his 59s, but he lived to the age of 34, when he died of old age.  She has a younger brother who has had hip replacement because of traumatic hip injury and associated degenerative changes.  He also has glaucoma.  He may have heart disease.  REVIEW OF SYSTEMS:  The patients weight has been stable.  Appetite is generally good.  She is bothered by neck stiffness with some frequency; in fact, this had caused her to quit playing golf in the past.  She has occasional knee pain, but it is not severe.  She has no problems with clotting disorders, and denies abnormal bleeding, nose or gum bleeds.  She denies cough or shortness of breath.  She does have some stress-related incontinence.  She denies rectal bleeding, melena.  PHYSICAL EXAMINATION:  VITAL SIGNS:  Blood pressure 150/74, pulse 82, respirations 18, temperature 97.6.  GENERAL:  Healthy-appearing white female.  She looks at least a decade younger than her stated age.  She does not appear toxic and is comfortable.  She is a good historian and is not confused.  HEENT:  Sclerae are nonicteric.  Conjunctivae pink.  Extraocular movements intact.  Oropharynx is moist and clear.  Dentition well intact and in good repair.  There are no lesions or exudates.  NECK:  No bruits, no JVD.  No masses or thyromegaly present.  There is a little tenderness in the posterior cervical muscles and upper shoulder muscles.  CHEST:  Clear to auscultation and percussion bilaterally.  HEART:  Regular rate and rhythm, with no  appreciable murmurs, rubs, or gallops.  PMI is not displaced.  ABDOMEN:  Soft, with mild distention.  There is marked left lower quadrant  tenderness associated with early rebound, but no guarding.  Bowel sounds are active.  No tinkling bowel sounds.  No increase to percussive tympany.  There is no hepatosplenomegaly or masses appreciated.  RECTAL:  Performed by the emergency room physician and was heme-negative, without palpable lesion.  EXTREMITIES:  Without cyanosis, clubbing, or edema.  DERMATOLOGIC:  The patient points to a region between the breasts, which is soon to be evaluated by her dermatologist.  This lesion appears to be an actinic keratosis.  NEUROLOGIC:  There are no tremors.  No confusion.  Alert and oriented x 3. Grip bilaterally is 5/5.  LABORATORY DATA:  Urinalysis is negative.  White blood cell count is 13.1, hemoglobin is 13.2, platelets  340,000.  Chemistries notable for sodium slightly low at 131, and chloride slightly low at 95.  Total bilirubin is 0.8, alkaline phosphatase 66, AST 24, and ALT 18.  Albumin slightly low at 3.4. Lipase low at 17.  IMPRESSION: 1. Acute diverticulitis with small area of perforation in the sigmoid region. 2. History of subacute right upper quadrant pain.  Rule out cholelithiasis,    rule out gastritis, rule out peptic ulcer disease. 3. Hyponatremia. 4. History of multiple abdominal/pelvic surgeries. 5. History of small-bowel obstruction requiring surgery. 6. History of gout.  PLAN:  The patient is to be continued on IV Cipro and IV Flagyl which were begin in the emergency room.  She will be treated, as well, with IV fluids and bowel rest.  Because of her MORPHINE allergy, we will use Demerol for pain control.  Plan to get an abdominal ultrasound for evaluation of the right upper quadrant pain. Dictated by:   Dianah Field, P.A. Attending Physician:  Starr Sinclair DD:  08/16/01 TD:  08/16/01 Job:  [id number]

## 2011-02-20 ENCOUNTER — Other Ambulatory Visit: Payer: Self-pay | Admitting: Family Medicine

## 2011-02-21 ENCOUNTER — Other Ambulatory Visit: Payer: Self-pay | Admitting: Family Medicine

## 2011-02-21 ENCOUNTER — Telehealth: Payer: Self-pay

## 2011-02-21 NOTE — Telephone Encounter (Signed)
Last seen 12/20/10 for chest pain and filled 01/07/11 please advise  ---[name] Patient      KP

## 2011-02-21 NOTE — Telephone Encounter (Signed)
Error

## 2011-02-21 NOTE — Telephone Encounter (Signed)
Ok for #30, no refills 

## 2011-03-04 ENCOUNTER — Encounter: Payer: Self-pay | Admitting: Internal Medicine

## 2011-03-04 ENCOUNTER — Ambulatory Visit (INDEPENDENT_AMBULATORY_CARE_PROVIDER_SITE_OTHER): Payer: Medicare Other | Admitting: Internal Medicine

## 2011-03-04 VITALS — BP 126/70 | HR 64 | Temp 98.8°F | Wt 103.8 lb

## 2011-03-04 DIAGNOSIS — J069 Acute upper respiratory infection, unspecified: Secondary | ICD-10-CM

## 2011-03-04 MED ORDER — AMOXICILLIN 500 MG PO CAPS: 500.0000 mg | ORAL_CAPSULE | Freq: Three times a day (TID) | ORAL | Status: AC

## 2011-03-04 MED ORDER — FLUTICASONE PROPIONATE 50 MCG/ACT NA SUSP: 1.0000 | Freq: Every day | NASAL | Status: DC

## 2011-03-04 NOTE — Progress Notes (Signed)
  Subjective:    Patient ID: Ashley Burnett, female    DOB: [birth year], 77 y.o.   MRN: [id number]  HPI Respiratory tract infection Onset/symptoms:6/9 Exposures (illness/environmental/extrinsic):allergy flare since 10/2010 Progression of symptoms:persistant rhinitis Treatments/response:nothing Present symptoms:cough Fever/chills/sweats:temp to ? 101 Frontal headache:no Facial pain:no Nasal purulence:no Sore throat:yes Dental pain:no Lymphadenopathy:no Wheezing/shortness of breath:no Cough/sputum/hemoptysis:dry Associated extrinsic/allergic symptoms:itchy eyes/ sneezing:eyes sting  Smoking history: 6  Decades ago           Review of Systems     Objective:   Physical Exam General appearance is of good health and nourishment; no acute distress or increased work of breathing is present.  No  lymphadenopathy about the head, neck, or axilla noted. Appears younger than age  Eyes: No conjunctival inflammation or lid edema is present. There is no scleral icterus.  Ears:  External ear exam shows no significant lesions or deformities.  Otoscopic examination reveals clear canals, tympanic membranes are intact bilaterally without bulging, retraction, inflammation or discharge.  Nose:  External nasal examination shows no deformity or inflammation. Nasal mucosa are pink and moist without lesions or exudates. No septal dislocation or dislocation.No obstruction to airflow.   Oral exam: Dental hygiene is good; lips and gums are healthy appearing.There is no oropharyngeal erythema or exudate noted.   Neck:  No deformities, thyromegaly, masses, or tenderness noted.   Supple with full range of motion without pain.   Heart:  Normal rate and regular rhythm. S1 and S2 normal without gallop, murmur, click, rub or other extra sounds.   Lungs:Chest clear to auscultation; no wheezes, rhonchi,rales ,or rubs present.No increased work of breathing.    Extremities:  No cyanosis, edema. Minor clubbing   noted    Skin: Warm & dry w/o jaundice or tenting.          Assessment & Plan:  #1 URI Plan: see orders

## 2011-03-04 NOTE — Patient Instructions (Signed)
Plain Mucinex for thick secretions ; force NON dairy fluids  For next 48 hrs . Neti Rinse daily as needed

## 2011-03-24 ENCOUNTER — Other Ambulatory Visit: Payer: Self-pay | Admitting: Family Medicine

## 2011-03-24 MED ORDER — CITALOPRAM HYDROBROMIDE 40 MG PO TABS: 40.0000 mg | ORAL_TABLET | Freq: Every day | ORAL | Status: DC

## 2011-03-24 NOTE — Telephone Encounter (Signed)
Rx faxed.    KP 

## 2011-07-21 ENCOUNTER — Ambulatory Visit (INDEPENDENT_AMBULATORY_CARE_PROVIDER_SITE_OTHER): Payer: Medicare PPO | Admitting: Internal Medicine

## 2011-07-21 ENCOUNTER — Encounter: Payer: Self-pay | Admitting: Internal Medicine

## 2011-07-21 DIAGNOSIS — L259 Unspecified contact dermatitis, unspecified cause: Secondary | ICD-10-CM

## 2011-07-21 DIAGNOSIS — J209 Acute bronchitis, unspecified: Secondary | ICD-10-CM

## 2011-07-21 DIAGNOSIS — L309 Dermatitis, unspecified: Secondary | ICD-10-CM

## 2011-07-21 MED ORDER — AZITHROMYCIN 250 MG PO TABS: ORAL_TABLET | ORAL | Status: AC

## 2011-07-21 NOTE — Patient Instructions (Addendum)
Mix Eucerin 1 part to Fluocinonide 0.05%  1 part and apply  Twice a day to itchy area of skin as needed .   Take Allegra 180 mg  daily for itching. While on the antibiotic, decrease the citalopram to 40 mg one half pill daily.

## 2011-07-21 NOTE — Progress Notes (Signed)
  Subjective:    Patient ID: Ashley Burnett, female    DOB: Jan 07, 1918, 77 y.o.   MRN: [id number]  HPI Respiratory tract infection Onset/symptoms:10/19 as nasal congestion Exposures (illness/environmental/extrinsic):no Progression of symptoms:chest involved Treatments/response:Bufferin/ minor benefit Present symptoms: Fever/chills/sweats:no Frontal headache:no Facial pain:no Nasal purulence:no Sore throat:no Dental pain:no Lymphadenopathy:no Wheezing/shortness of breath:some dyspnea but stable & chronic Cough/sputum/hemoptysis:yellow Associated extrinsic/allergic symptoms:itchy eyes/ sneezing:eyes itch Past medical history: Seasonal allergies: yes/asthma:no Smoking history:quit  1962  RASH:  Location: neck & back  Onset: 2 months ago   Course: stable rash but still itching Treated with: Fluocinolone 0.05% from Dr Londell Moh           Improvement with treatment: minimal response History Tenderness: no  New medications/antibiotics: no  Tick/insect/pet exposure: no  Recent travel: no  New detergent, new clothing, or other topical exposure: no  Red Flags Feeling ill: no  Fever: no  Mouth lesions: no  Facial/tongue swelling/difficulty breathing:  no  Diabetic or immunocompromised: no              Review of Systems     Objective:   Physical Exam General appearance :thin; in  no acute distress or increased work of breathing is present.  No  lymphadenopathy about the head, neck, or axilla noted.   Eyes: No conjunctival inflammation or lid edema is present. Arcus senilis.  Ears:  External ear exam shows no significant lesions or deformities.  Otoscopic examination reveals clear canals, tympanic membranes are intact bilaterally without bulging, retraction, inflammation or discharge.  Nose:  External nasal examination shows no deformity or inflammation. Nasal mucosa are pink and moist without lesions or exudates. No septal dislocation .No obstruction to airflow.   Oral  exam: Dental hygiene is good; lips and gums are healthy appearing.There is no oropharyngeal erythema or exudate noted.      Heart:  Normal rate and regular rhythm. S1 and S2 normal without gallop, murmur, click, rub . S4  Lungs:Chest : mild low grade  rhonchi,rales @ bases.No increased work of breathing.    Extremities:  No cyanosis, edema, or clubbing  noted    Skin: Warm & dry ; excoriations are noted of the upper back and the border of the posterior scalp on the neck. She has no intertriginous lesions. The scalp itself appears clear. Skin is dry diffusely          Assessment & Plan:  #1 bronchitis, acute without significant bronchospasm  #2 dermatitis which has not responded to steroid cream  Plan: See orders and recommendations

## 2011-09-02 ENCOUNTER — Telehealth: Payer: Self-pay | Admitting: Family Medicine

## 2011-09-02 NOTE — Telephone Encounter (Signed)
[place] for [employer] [place] - she needs a letter from pcp stating that she has memory issues - she is getting letter from dr love - but also needs letter from pcp - daughter refused appt

## 2011-09-02 NOTE — Telephone Encounter (Signed)
Please advise      KP 

## 2011-09-02 NOTE — Telephone Encounter (Signed)
Ok to give note that she has memory loss/ dementia---is that all she needs.?

## 2011-09-03 NOTE — Telephone Encounter (Signed)
msg left making patient aware the letter is ready for pick up      KP

## 2011-09-26 ENCOUNTER — Ambulatory Visit (INDEPENDENT_AMBULATORY_CARE_PROVIDER_SITE_OTHER): Payer: Medicare Other | Admitting: Family Medicine

## 2011-09-26 ENCOUNTER — Telehealth: Payer: Self-pay | Admitting: *Deleted

## 2011-09-26 ENCOUNTER — Encounter: Payer: Self-pay | Admitting: Family Medicine

## 2011-09-26 VITALS — BP 108/64 | HR 64 | Temp 97.9°F | Wt 112.6 lb

## 2011-09-26 DIAGNOSIS — W5501XA Bitten by cat, initial encounter: Secondary | ICD-10-CM

## 2011-09-26 DIAGNOSIS — IMO0001 Reserved for inherently not codable concepts without codable children: Secondary | ICD-10-CM

## 2011-09-26 DIAGNOSIS — S61509A Unspecified open wound of unspecified wrist, initial encounter: Secondary | ICD-10-CM

## 2011-09-26 DIAGNOSIS — Z23 Encounter for immunization: Secondary | ICD-10-CM

## 2011-09-26 NOTE — Patient Instructions (Signed)
Animal Bite An animal bite can result in a scratch on the skin, deep open cut, puncture of the skin, crush injury, or tearing away of the skin or a body part. Dogs are responsible for most animal bites. Children are bitten more often than adults. An animal bite can range from very mild to more serious. A small bite from your house pet is no cause for alarm. However, some animal bites can become infected or injure a bone or other tissue. You must seek medical care if:  The skin is broken and bleeding does not slow down or stop after 15 minutes.   The puncture is deep and difficult to clean (such as a cat bite).   Pain, warmth, redness, or pus develops around the wound.   The bite is from a stray animal or rodent. There may be a risk of rabies infection.   The bite is from a snake, raccoon, skunk, fox, coyote, or bat. There may be a risk of rabies infection.   The person bitten has a chronic illness such as diabetes, liver disease, or cancer, or the person takes medicine that lowers the immune system.   There is concern about the location and severity of the bite.  It is important to clean and protect an animal bite wound right away to prevent infection. Follow these steps:  Clean the wound with plenty of water and soap.   Apply an antibiotic cream.   Apply gentle pressure over the wound with a clean towel or gauze to slow or stop bleeding.   Elevate the affected area above the heart to help stop any bleeding.   Seek medical care. Getting medical care within 8 hours of the animal bite leads to the best possible outcome.  DIAGNOSIS  Your caregiver will most likely:  Take a detailed history of the animal and the bite injury.   Perform a wound exam.   Take your medical history.  Blood tests or X-rays may be performed. Sometimes, infected bite wounds are cultured and sent to a lab to identify the infectious bacteria.  TREATMENT  Medical treatment will depend on the location and type of  animal bite as well as the patient's medical history. Treatment may include:  Wound care, such as cleaning and flushing the wound with saline solution, bandaging, and elevating the affected area.   Antibiotics.   Tetanus immunization.   Rabies immunization.   Leaving the wound open to heal. This is often done with animal bites, due to the high risk of infection. However, in certain cases, wound closure with stitches, wound adhesive, skin adhesive strips, or staples may be used.  Infected bites that are left untreated may require intravenous (IV) antibiotics and surgical treatment in the hospital. HOME CARE INSTRUCTIONS  Follow your caregiver's instructions for wound care.   Take all medicines as directed.   If your caregiver prescribes antibiotics, take them as directed. Finish them even if you start to feel better.   Follow up with your caregiver for further exams or immunizations as directed.  You may need a tetanus shot if:  You cannot remember when you had your last tetanus shot.   You have never had a tetanus shot.   The injury broke your skin.  If you get a tetanus shot, your arm may swell, get red, and feel warm to the touch. This is common and not a problem. If you need a tetanus shot and you choose not to have one, there is a   rare chance of getting tetanus. Sickness from tetanus can be serious. SEEK MEDICAL CARE IF:  You notice warmth, redness, soreness, swelling, pus discharge, or a bad smell coming from the wound.   You have a red line on the skin coming from the wound.   You have a fever, chills, or a general ill feeling.   You have nausea or vomiting.   You have continued or worsening pain.   You have trouble moving the injured part.   You have other questions or concerns.  MAKE SURE YOU:  Understand these instructions.   Will watch your condition.   Will get help right away if you are not doing well or get worse.  Document Released: 05/27/2011  Document Reviewed: 04/30/2011 [employer] Patient Information 2012 [employer]. 

## 2011-09-26 NOTE — Progress Notes (Signed)
  Subjective:    Patient ID: Ashley Burnett, female    DOB: [birth year], 78 y.o.   MRN: [id number]  HPI Pt here c/o cat bite a couple days ago.  Pt is pretty sure she needs a tetanus shot .  No other symptoms.   Review of Systems As above    Objective:   Physical Exam  Constitutional: She appears well-developed and well-nourished.  Skin:       R wrist-- + small laceration about 1 cm--no signs of infection  Psychiatric: She has a normal mood and affect.          Assessment & Plan:  Cat bite---  abx ointment and bandaid                   Dtap given

## 2011-09-26 NOTE — Telephone Encounter (Signed)
Pt called to advise that her cat bit her last night, and she wants a tetnis shot, spoke with MD Lowne and she advised that she needs to be seen, pt was set up for a pt at 1pm today

## 2011-11-05 ENCOUNTER — Other Ambulatory Visit: Payer: Self-pay | Admitting: Family Medicine

## 2011-12-17 ENCOUNTER — Ambulatory Visit: Payer: Medicare Other | Admitting: Family Medicine

## 2011-12-17 DIAGNOSIS — Z0289 Encounter for other administrative examinations: Secondary | ICD-10-CM

## 2012-06-25 ENCOUNTER — Other Ambulatory Visit: Payer: Self-pay | Admitting: Orthopedic Surgery

## 2012-06-25 DIAGNOSIS — M545 Low back pain: Secondary | ICD-10-CM

## 2012-06-28 ENCOUNTER — Ambulatory Visit
Admission: RE | Admit: 2012-06-28 | Discharge: 2012-06-28 | Disposition: A | Discharge status: Home / Self Care | Payer: Medicare Other | Source: Ambulatory Visit | Attending: Orthopedic Surgery | Admitting: Orthopedic Surgery

## 2012-06-28 DIAGNOSIS — M545 Low back pain: Secondary | ICD-10-CM

## 2012-10-21 ENCOUNTER — Telehealth: Payer: Self-pay | Admitting: Family Medicine

## 2012-10-21 NOTE — Telephone Encounter (Signed)
Patient is requesting a refill of vicodin

## 2012-10-21 NOTE — Telephone Encounter (Signed)
Discussed with pt

## 2012-10-21 NOTE — Telephone Encounter (Signed)
Then we can not fill it

## 2012-10-21 NOTE — Telephone Encounter (Signed)
This patient has not been seen since 09/26/11 and Hydrocodone is not on the medication list. Please advise     KP

## 2013-02-04 ENCOUNTER — Emergency Department (HOSPITAL_COMMUNITY): Payer: Medicare Other

## 2013-02-04 ENCOUNTER — Inpatient Hospital Stay (HOSPITAL_COMMUNITY)
Admission: EM | Admit: 2013-02-04 | Discharge: 2013-02-17 | DRG: 330 | Disposition: A | Discharge status: Skilled Nursing Facility | Payer: Medicare Other | Attending: General Surgery | Admitting: General Surgery

## 2013-02-04 ENCOUNTER — Encounter (HOSPITAL_COMMUNITY): Payer: Self-pay | Admitting: Emergency Medicine

## 2013-02-04 ENCOUNTER — Ambulatory Visit: Payer: Medicare Other | Admitting: Internal Medicine

## 2013-02-04 DIAGNOSIS — K560 Paralytic ileus: Secondary | ICD-10-CM | POA: Diagnosis not present

## 2013-02-04 DIAGNOSIS — K565 Intestinal adhesions [bands], unspecified as to partial versus complete obstruction: Principal | ICD-10-CM | POA: Diagnosis present

## 2013-02-04 DIAGNOSIS — K929 Disease of digestive system, unspecified: Secondary | ICD-10-CM | POA: Diagnosis not present

## 2013-02-04 DIAGNOSIS — R000 Tachycardia, unspecified: Secondary | ICD-10-CM | POA: Diagnosis not present

## 2013-02-04 DIAGNOSIS — I10 Essential (primary) hypertension: Secondary | ICD-10-CM | POA: Diagnosis present

## 2013-02-04 DIAGNOSIS — R112 Nausea with vomiting, unspecified: Secondary | ICD-10-CM | POA: Diagnosis present

## 2013-02-04 DIAGNOSIS — R1032 Left lower quadrant pain: Secondary | ICD-10-CM

## 2013-02-04 DIAGNOSIS — K5730 Diverticulosis of large intestine without perforation or abscess without bleeding: Secondary | ICD-10-CM | POA: Diagnosis present

## 2013-02-04 DIAGNOSIS — M810 Age-related osteoporosis without current pathological fracture: Secondary | ICD-10-CM | POA: Diagnosis present

## 2013-02-04 DIAGNOSIS — Y9210 Unspecified residential institution as the place of occurrence of the external cause: Secondary | ICD-10-CM | POA: Diagnosis not present

## 2013-02-04 DIAGNOSIS — Z888 Allergy status to other drugs, medicaments and biological substances status: Secondary | ICD-10-CM

## 2013-02-04 DIAGNOSIS — Y836 Removal of other organ (partial) (total) as the cause of abnormal reaction of the patient, or of later complication, without mention of misadventure at the time of the procedure: Secondary | ICD-10-CM | POA: Diagnosis not present

## 2013-02-04 DIAGNOSIS — F4321 Adjustment disorder with depressed mood: Secondary | ICD-10-CM | POA: Diagnosis not present

## 2013-02-04 DIAGNOSIS — Z8673 Personal history of transient ischemic attack (TIA), and cerebral infarction without residual deficits: Secondary | ICD-10-CM

## 2013-02-04 DIAGNOSIS — R1013 Epigastric pain: Secondary | ICD-10-CM

## 2013-02-04 DIAGNOSIS — E876 Hypokalemia: Secondary | ICD-10-CM | POA: Diagnosis not present

## 2013-02-04 DIAGNOSIS — Z87891 Personal history of nicotine dependence: Secondary | ICD-10-CM

## 2013-02-04 DIAGNOSIS — R5381 Other malaise: Secondary | ICD-10-CM | POA: Diagnosis not present

## 2013-02-04 DIAGNOSIS — F411 Generalized anxiety disorder: Secondary | ICD-10-CM

## 2013-02-04 DIAGNOSIS — K219 Gastro-esophageal reflux disease without esophagitis: Secondary | ICD-10-CM | POA: Diagnosis present

## 2013-02-04 DIAGNOSIS — G8929 Other chronic pain: Secondary | ICD-10-CM | POA: Diagnosis present

## 2013-02-04 DIAGNOSIS — M545 Low back pain, unspecified: Secondary | ICD-10-CM | POA: Diagnosis present

## 2013-02-04 DIAGNOSIS — E7800 Pure hypercholesterolemia, unspecified: Secondary | ICD-10-CM | POA: Diagnosis present

## 2013-02-04 DIAGNOSIS — Z602 Problems related to living alone: Secondary | ICD-10-CM

## 2013-02-04 DIAGNOSIS — L8991 Pressure ulcer of unspecified site, stage 1: Secondary | ICD-10-CM | POA: Diagnosis present

## 2013-02-04 DIAGNOSIS — Z79899 Other long term (current) drug therapy: Secondary | ICD-10-CM

## 2013-02-04 DIAGNOSIS — F0390 Unspecified dementia without behavioral disturbance: Secondary | ICD-10-CM | POA: Diagnosis present

## 2013-02-04 DIAGNOSIS — L89109 Pressure ulcer of unspecified part of back, unspecified stage: Secondary | ICD-10-CM | POA: Diagnosis present

## 2013-02-04 DIAGNOSIS — Z780 Asymptomatic menopausal state: Secondary | ICD-10-CM

## 2013-02-04 DIAGNOSIS — K56609 Unspecified intestinal obstruction, unspecified as to partial versus complete obstruction: Secondary | ICD-10-CM | POA: Diagnosis present

## 2013-02-04 LAB — URINE MICROSCOPIC-ADD ON

## 2013-02-04 LAB — COMPREHENSIVE METABOLIC PANEL
ALT: 14 U/L (ref 0–35)
Albumin: 4.2 g/dL (ref 3.5–5.2)
Alkaline Phosphatase: 66 U/L (ref 39–117)
Chloride: 101 mEq/L (ref 96–112)
GFR calc Af Amer: >90 mL/min (ref >90)
Glucose, Bld: 151 mg/dL — ABNORMAL HIGH (ref 70–99)
Potassium: 4.6 mEq/L (ref 3.5–5.1)
Sodium: 138 mEq/L (ref 135–145)
Total Protein: 7.6 g/dL (ref 6.0–8.3)

## 2013-02-04 LAB — URINALYSIS, ROUTINE W REFLEX MICROSCOPIC
Bilirubin Urine: NEGATIVE
Specific Gravity, Urine: 1.016 (ref 1.005–1.030)
pH: 7.5 (ref 5.0–8.0)

## 2013-02-04 LAB — LIPASE, BLOOD: Lipase: 17 U/L (ref 11–59)

## 2013-02-04 LAB — CBC WITH DIFFERENTIAL/PLATELET
Basophils Absolute: 0.1 10*3/uL (ref 0.0–0.1)
Basophils Relative: 1 % (ref 0–1)
Eosinophils Relative: 0 % (ref 0–5)
HCT: 39.8 % (ref 36.0–46.0)
MCHC: 35.2 g/dL (ref 30.0–36.0)
MCV: 85.8 fL (ref 78.0–100.0)
Monocytes Absolute: 0.3 10*3/uL (ref 0.1–1.0)
RDW: 14 % (ref 11.5–15.5)

## 2013-02-04 LAB — OCCULT BLOOD, POC DEVICE: Fecal Occult Bld: NEGATIVE

## 2013-02-04 MED ORDER — ZOLPIDEM TARTRATE 5 MG PO TABS: 5.0000 mg | ORAL_TABLET | Freq: Every evening | ORAL | Status: DC | PRN

## 2013-02-04 MED ORDER — IOHEXOL 300 MG/ML  SOLN: 25.0000 mL | INTRAMUSCULAR | Status: AC

## 2013-02-04 MED ORDER — SODIUM CHLORIDE 0.9 % IV BOLUS (SEPSIS)
1000.0000 mL | Freq: Once | INTRAVENOUS | Status: AC
  Administered 2013-02-04: 1000 mL via INTRAVENOUS

## 2013-02-04 MED ORDER — SODIUM CHLORIDE 0.9 % IV SOLN
Freq: Once | INTRAVENOUS | Status: AC
  Administered 2013-02-04: 22:00:00 via INTRAVENOUS

## 2013-02-04 MED ORDER — IOHEXOL 300 MG/ML  SOLN
80.0000 mL | Freq: Once | INTRAMUSCULAR | Status: AC | PRN
  Administered 2013-02-04: 80 mL via INTRAVENOUS

## 2013-02-04 MED ORDER — ONDANSETRON HCL 4 MG PO TABS: 4.0000 mg | ORAL_TABLET | Freq: Four times a day (QID) | ORAL | Status: DC | PRN

## 2013-02-04 MED ORDER — PANTOPRAZOLE SODIUM 40 MG IV SOLR
40.0000 mg | Freq: Two times a day (BID) | INTRAVENOUS | Status: DC
  Administered 2013-02-05 – 2013-02-15 (×22): 40 mg via INTRAVENOUS
  Filled 2013-02-04 (×29): qty 40

## 2013-02-04 MED ORDER — ONDANSETRON HCL 4 MG/2ML IJ SOLN
4.0000 mg | Freq: Once | INTRAMUSCULAR | Status: AC
  Administered 2013-02-04: 4 mg via INTRAVENOUS
  Filled 2013-02-04: qty 2

## 2013-02-04 MED ORDER — HYDROMORPHONE HCL PF 1 MG/ML IJ SOLN
0.5000 mg | INTRAMUSCULAR | Status: DC | PRN
  Administered 2013-02-05: 0.5 mg via INTRAVENOUS
  Filled 2013-02-04: qty 1

## 2013-02-04 MED ORDER — SODIUM CHLORIDE 0.9 % IV SOLN
INTRAVENOUS | Status: DC
  Administered 2013-02-05 – 2013-02-08 (×10): via INTRAVENOUS

## 2013-02-04 MED ORDER — MORPHINE SULFATE 4 MG/ML IJ SOLN
4.0000 mg | Freq: Once | INTRAMUSCULAR | Status: AC
  Administered 2013-02-04: 4 mg via INTRAVENOUS
  Filled 2013-02-04: qty 1

## 2013-02-04 MED ORDER — OXYCODONE HCL 5 MG PO TABS: 5.0000 mg | ORAL_TABLET | ORAL | Status: DC | PRN

## 2013-02-04 MED ORDER — ACETAMINOPHEN 650 MG RE SUPP: 650.0000 mg | Freq: Four times a day (QID) | RECTAL | Status: DC | PRN

## 2013-02-04 MED ORDER — ACETAMINOPHEN 325 MG PO TABS: 650.0000 mg | ORAL_TABLET | Freq: Four times a day (QID) | ORAL | Status: DC | PRN

## 2013-02-04 MED ORDER — HYDROMORPHONE HCL PF 1 MG/ML IJ SOLN
0.5000 mg | Freq: Once | INTRAMUSCULAR | Status: AC
  Administered 2013-02-04: 0.5 mg via INTRAVENOUS
  Filled 2013-02-04: qty 1

## 2013-02-04 MED ORDER — ONDANSETRON HCL 4 MG/2ML IJ SOLN
4.0000 mg | Freq: Four times a day (QID) | INTRAMUSCULAR | Status: DC | PRN
  Administered 2013-02-05 – 2013-02-06 (×3): 4 mg via INTRAVENOUS
  Filled 2013-02-04 (×3): qty 2

## 2013-02-04 MED ORDER — SODIUM CHLORIDE 0.9 % IV SOLN: INTRAVENOUS | Status: DC

## 2013-02-04 MED ORDER — ALUM & MAG HYDROXIDE-SIMETH 200-200-20 MG/5ML PO SUSP: 30.0000 mL | Freq: Four times a day (QID) | ORAL | Status: DC | PRN

## 2013-02-04 MED ORDER — ONDANSETRON HCL 4 MG/2ML IJ SOLN
4.0000 mg | Freq: Three times a day (TID) | INTRAMUSCULAR | Status: DC | PRN
  Administered 2013-02-04: 4 mg via INTRAVENOUS
  Filled 2013-02-04: qty 2

## 2013-02-04 NOTE — ED Notes (Signed)
Pt c/o generalized abd pain with N/V x 2 days

## 2013-02-04 NOTE — ED Notes (Signed)
C/o severe abdominal pain for 2 days. Denies diarrhea and vomiting.

## 2013-02-04 NOTE — ED Provider Notes (Signed)
History     CSN: [id number]  Arrival date & time 02/04/13  1256   First MD Initiated Contact with Patient 02/04/13 1533      Chief Complaint  Patient presents with  . Abdominal Pain  . Emesis    (Consider location/radiation/quality/duration/timing/severity/associated sxs/prior treatment) HPI Comments: 8 patient presents with diffuse severe abdominal pain has slightly worsened since last night. She feels nauseated but has not vomited. She is uncertain about her bowel movements. Her daughter think she just had one a day before yesterday. She denies any fevers, urinary or vaginal symptoms. She has a history of diverticulosis with at least one laparotomy in the past. Patient denies passing gas today. Denies having a bowel movement today. Denies any vomiting. Pain is increasingly worsened throughout the day. Nothing makes it worse. Turning on the side makes it better. No chest pain or shortness of breath.  The history is provided by the patient, a relative and a caregiver.    Past Medical History  Diagnosis Date  . Diverticulosis     universal,severe  . Family history of GERD   . Osteoporosis   . Transient ischemic attack   . Fracture     leg wrist  . High cholesterol   . Depression   . Gastritis     Past Surgical History  Procedure Laterality Date  . Abdominal hysterectomy    . Laparotomy  1960    [facility]  . Ovarian cyst removal    . Tonsillectomy    . Appendectomy    . Abdominal adhesion surgery  1993    [facility]     Family History  Problem Relation Age of Onset  . Stroke    . Colon cancer    . Heart disease      aunt  . Melanoma Mother   . Tuberculosis Father     History  Substance Use Topics  . Smoking status: Former Games developer  . Smokeless tobacco: Never Used  . Alcohol Use: No    OB History   Grav Para Term Preterm Abortions TAB SAB Ect Mult Living                  Review of Systems  Constitutional: Positive for activity change, appetite change and fatigue.  Negative for fever.  HENT: Negative for congestion and rhinorrhea.   Respiratory: Negative for cough, chest tightness and shortness of breath.   Cardiovascular: Negative for chest pain.  Gastrointestinal: Positive for nausea, abdominal pain and constipation. Negative for vomiting and diarrhea.  Genitourinary: Negative for dysuria, hematuria, vaginal bleeding and vaginal discharge.  Musculoskeletal: Negative for back pain.  Skin: Negative for rash.  Neurological: Positive for weakness. Negative for dizziness.  A complete 10 system review of systems was obtained and all systems are negative except as noted in the HPI and PMH.    Allergies  Codeine; Indomethacin; and Morphine and related  Home Medications   Current Outpatient Rx  Name  Route  Sig  Dispense  Refill  . amoxicillin (AMOXIL) 500 MG capsule   Oral   Take 500 mg by mouth 3 (three) times daily.         . fluocinonide (LIDEX) 0.05 % cream   Topical   Apply 1 application topically daily.           Marland Kitchen HYDROcodone-acetaminophen (NORCO/VICODIN) 5-325 MG per tablet   Oral   Take 1 tablet by mouth every 6 (six) hours as needed for pain.  BP 177/95  Pulse 105  Temp(Src) 97.6 F (36.4 C) (Oral)  Resp 20  SpO2 96%  Physical Exam  Constitutional: She is oriented to person, place, and time. She appears well-developed and well-nourished. She appears distressed.  Moaning uncomfortable.  HENT:  Head: Normocephalic and atraumatic.  Mouth/Throat: No oropharyngeal exudate.  Dry mucus membranes  Eyes: Conjunctivae and EOM are normal. Pupils are equal, round, and reactive to light.  Neck: Normal range of motion. Neck supple.  Cardiovascular: Normal rate, regular rhythm and normal heart sounds.   No murmur heard. Equal femoral, DP and PT pulses  Pulmonary/Chest: Effort normal and breath sounds normal. No respiratory distress. She has no wheezes.  Abdominal: Soft. There is tenderness. There is rebound and guarding.   Diffuse abdominal tenderness with guarding.  Musculoskeletal: Normal range of motion. She exhibits no edema and no tenderness.  Neurological: She is alert and oriented to person, place, and time. No cranial nerve deficit. She exhibits normal muscle tone. Coordination normal.  Skin: Skin is warm.    ED Course  Procedures (including critical care time)  Labs Reviewed  COMPREHENSIVE METABOLIC PANEL - Abnormal; Notable for the following:    Glucose, Bld 151 (*)    Creatinine, Ser 0.46 (*)    GFR calc non Af Amer 84 (*)    All other components within normal limits  CBC WITH DIFFERENTIAL - Abnormal; Notable for the following:    WBC 10.8 (*)    Neutrophils Relative % 90 (*)    Neutro Abs 9.7 (*)    Lymphocytes Relative 7 (*)    Monocytes Relative 2 (*)    All other components within normal limits  URINALYSIS, ROUTINE W REFLEX MICROSCOPIC - Abnormal; Notable for the following:    Glucose, UA 250 (*)    Hgb urine dipstick SMALL (*)    Ketones, ur >80 (*)    Protein, ur 30 (*)    All other components within normal limits  URINE MICROSCOPIC-ADD ON - Abnormal; Notable for the following:    Bacteria, UA MANY (*)    All other components within normal limits  URINE CULTURE  LIPASE, BLOOD  LACTIC ACID, PLASMA  TROPONIN I  PROTIME-INR  OCCULT BLOOD GASTRIC / DUODENUM (SPECIMEN CUP)  OCCULT BLOOD, POC DEVICE   Ct Abdomen Pelvis W Contrast  02/04/2013   *RADIOLOGY REPORT*  Clinical Data: Abdominal pain and emesis  CT ABDOMEN AND PELVIS WITH CONTRAST  Technique:  Multidetector CT imaging of the abdomen and pelvis was performed following the standard protocol during bolus administration of intravenous contrast.  Contrast: 80mL OMNIPAQUE IOHEXOL 300 MG/ML  SOLN  Comparison: 06/18/2005  Findings: Scar versus atelectasis is noted in the right lung base. No pleural or pericardial effusion.  There is no focal liver abnormalities identified.  The gallbladder appears normal.  No biliary dilatation.   The pancreas is unremarkable.  Normal appearance of the spleen.  The adrenal glands are both normal.  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  This is unchanged from previous exam.  The left kidney is negative.  There is gas within the lumen of the urinary bladder.  There is a Foley catheter present.  Calcified atherosclerotic disease affects the abdominal aorta. There is no aneurysm.  There is no upper abdominal adenopathy.  No pelvic or inguinal adenopathy noted.  No free fluid or fluid collections identified within the abdomen or pelvis.  Normal appearance of the stomach.  The proximal small bowel loops are abnormally dilated measuring up to 4 cm.  Small bowel fluid levels are also present.  The mid and distal small bowel loops are collapsed.  Transition point is identified within the left iliac fossa, image number 45/series 2.  Gas and stool noted throughout the colon.  There is extensive colonic diverticulosis without evidence for diverticulitis.  There is no free intraperitoneal air identified within the abdomen or pelvis.  No evidence for bowel perforation or abscess formation.  Review of the visualized osseous structures is significant for multilevel degenerative disc disease.  IMPRESSION:  1.  Examination is positive for proximal small bowel obstruction. Transition point is at the level of the mid small bowel in the left iliac fossa.  2.  No evidence for perforation or abscess formation. 3.  Lumbar degenerative disc disease.   Original Report Authenticated By: Signa Kell, M.D.   Dg Abd Acute W/chest  02/04/2013   *RADIOLOGY REPORT*  Clinical Data: Abdominal pain  ACUTE ABDOMEN SERIES (ABDOMEN 2 VIEW & CHEST 1 VIEW)  Comparison: 08/14/2009  Findings: Heart size appears normal.  There is no pleural effusion or edema.  The lungs are hyperinflated and there is coarsened interstitial markings noted  bilaterally.  Nasogastric tube is noted with side port below GE junction.  There is a moderate stool burden identified throughout the colon.  Dilated loop of small bowel is noted in the left lower quadrant measuring up to 4.3 cm.  On the lateral decubitus radiograph there are multiple small bowel fluid levels.  No free intraperitoneal air is identified.  IMPRESSION:  1.  Dilated small bowel loops and fluid levels.  Cannot rule out to small bowel obstruction. 2.  No acute cardiopulmonary abnormalities.   Original Report Authenticated By: Signa Kell, M.D.     1. Small bowel obstruction       MDM  Severe abdominal pain with peritoneal signs on arrival. Vital stable. Patient has been waiting room for 3 hours. Labs reviewed, showing mild leukocytosis. We'll obtain a stat acute abdominal series, lactate, order CT. Discussed with PA Marlyne Beards of surgery on arrival.  Mild leukocytosis. Lactate normal. Urinalysis positive for ketones. Small bowel obstruction findings on x-ray discussed with Dr. Carolynne Edouard. He will consult on patient, will await for CT scan and recommends medical admission.  CT scan confirms small bowel obstruction with transitional point in the proximal left abdomen. Patient remains hemodynamically stable. Dr. Carolynne Edouard will consult, Dr. Lovell Sheehan will admit.   Date: 02/04/2013  Rate: 97  Rhythm: normal sinus rhythm  QRS Axis: normal  Intervals: normal  ST/T Wave abnormalities: normal  Conduction Disutrbances:none  Narrative Interpretation: rate faster  Old EKG Reviewed: changes noted       Glynn Octave, MD 02/04/13 2302

## 2013-02-04 NOTE — ED Notes (Signed)
Transported to the floor by Molly Maduro EMT.  Report already provided by previous RN to floor.

## 2013-02-04 NOTE — ED Notes (Signed)
Please call daughter, Genette Huertas ( same name as patient) in the morning before patient goes to surgery [phone], [phone]. Daughter would like to be her before she goes to surgery

## 2013-02-05 ENCOUNTER — Encounter (HOSPITAL_COMMUNITY): Payer: Self-pay | Admitting: *Deleted

## 2013-02-05 DIAGNOSIS — R1032 Left lower quadrant pain: Secondary | ICD-10-CM

## 2013-02-05 DIAGNOSIS — K56609 Unspecified intestinal obstruction, unspecified as to partial versus complete obstruction: Secondary | ICD-10-CM

## 2013-02-05 DIAGNOSIS — R112 Nausea with vomiting, unspecified: Secondary | ICD-10-CM

## 2013-02-05 DIAGNOSIS — R52 Pain, unspecified: Secondary | ICD-10-CM

## 2013-02-05 LAB — URINE CULTURE
Colony Count: NO GROWTH
Culture: NO GROWTH

## 2013-02-05 LAB — BASIC METABOLIC PANEL
BUN: 10 mg/dL (ref 6–23)
CO2: 20 mEq/L (ref 19–32)
Calcium: 8.9 mg/dL (ref 8.4–10.5)
Creatinine, Ser: 0.43 mg/dL — ABNORMAL LOW (ref 0.50–1.10)

## 2013-02-05 LAB — CBC
MCH: 29.9 pg (ref 26.0–34.0)
MCHC: 35 g/dL (ref 30.0–36.0)
MCV: 85.3 fL (ref 78.0–100.0)
Platelets: 248 10*3/uL (ref 150–400)
RDW: 14.1 % (ref 11.5–15.5)
WBC: 10.5 10*3/uL (ref 4.0–10.5)

## 2013-02-05 LAB — GASTRIC OCCULT BLOOD (1-CARD TO LAB): Occult Blood, Gastric: NEGATIVE

## 2013-02-05 MED ORDER — LORAZEPAM 0.5 MG PO TABS: 0.5000 mg | ORAL_TABLET | Freq: Once | ORAL | Status: DC

## 2013-02-05 MED ORDER — MENTHOL 3 MG MT LOZG
1.0000 | LOZENGE | OROMUCOSAL | Status: DC | PRN
  Administered 2013-02-05: 3 mg via ORAL
  Filled 2013-02-05: qty 9

## 2013-02-05 MED ORDER — KETOROLAC TROMETHAMINE 15 MG/ML IJ SOLN
15.0000 mg | Freq: Four times a day (QID) | INTRAMUSCULAR | Status: DC | PRN
  Administered 2013-02-05 – 2013-02-07 (×4): 15 mg via INTRAVENOUS
  Filled 2013-02-05 (×4): qty 1

## 2013-02-05 MED ORDER — LORAZEPAM 2 MG/ML IJ SOLN
0.5000 mg | Freq: Once | INTRAMUSCULAR | Status: AC
  Administered 2013-02-05: 0.5 mg via INTRAVENOUS
  Filled 2013-02-05: qty 1

## 2013-02-05 NOTE — Consult Note (Signed)
Reason for Consult:abdominal pain Referring Physician: Dr. Joesph Fillers is an 79 y.o. female.  HPI: 79 yo wf with history of multiple abdominal surgeries presents with abdominal pain for the last couple days. She has been nauseated. She does not recall when her last bm was. No fever  Past Medical History  Diagnosis Date  . Diverticulosis     universal,severe  . Family history of GERD   . Osteoporosis   . Transient ischemic attack   . Fracture     leg wrist  . High cholesterol   . Depression   . Gastritis     Past Surgical History  Procedure Laterality Date  . Abdominal hysterectomy    . Laparotomy  1960    SBO  . Ovarian cyst removal    . Tonsillectomy    . Appendectomy    . Abdominal adhesion surgery  1993    SBO     Family History  Problem Relation Age of Onset  . Stroke    . Colon cancer    . Heart disease      aunt  . Melanoma Mother   . Tuberculosis Father     Social History:  reports that she has quit smoking. She has never used smokeless tobacco. She reports that she does not drink alcohol or use illicit drugs.  Allergies:  Allergies  Allergen Reactions  . Codeine     unknown  . Indomethacin     unknown  . Morphine And Related     unknown    Medications: I have reviewed the patient's current medications.  Results for orders placed during the hospital encounter of 02/04/13 (from the past 48 hour(s))  COMPREHENSIVE METABOLIC PANEL     Status: Abnormal   Collection Time    02/04/13  1:21 PM      Result Value Range   Sodium 138  135 - 145 mEq/L   Potassium 4.6  3.5 - 5.1 mEq/L   Chloride 101  96 - 112 mEq/L   CO2 25  19 - 32 mEq/L   Glucose, Bld 151 (*) 70 - 99 mg/dL   BUN 11  6 - 23 mg/dL   Creatinine, Ser 6.21 (*) 0.50 - 1.10 mg/dL   Calcium 9.7  8.4 - 30.8 mg/dL   Total Protein 7.6  6.0 - 8.3 g/dL   Albumin 4.2  3.5 - 5.2 g/dL   AST 22  0 - 37 U/L   ALT 14  0 - 35 U/L   Alkaline Phosphatase 66  39 - 117 U/L   Total Bilirubin 0.6   0.3 - 1.2 mg/dL   GFR calc non Af Amer 84 (*) >90 mL/min   GFR calc Af Amer >90  >90 mL/min   Comment:            The eGFR has been calculated     using the CKD EPI equation.     This calculation has not been     validated in all clinical     situations.     eGFR's persistently     <90 mL/min signify     possible Chronic Kidney Disease.  CBC WITH DIFFERENTIAL     Status: Abnormal   Collection Time    02/04/13  1:21 PM      Result Value Range   WBC 10.8 (*) 4.0 - 10.5 K/uL   RBC 4.64  3.87 - 5.11 MIL/uL   Hemoglobin 14.0  12.0 - 15.0  g/dL   HCT 16.1  09.6 - 04.5 %   MCV 85.8  78.0 - 100.0 fL   MCH 30.2  26.0 - 34.0 pg   MCHC 35.2  30.0 - 36.0 g/dL   RDW 40.9  81.1 - 91.4 %   Platelets 266  150 - 400 K/uL   Neutrophils Relative % 90 (*) 43 - 77 %   Neutro Abs 9.7 (*) 1.7 - 7.7 K/uL   Lymphocytes Relative 7 (*) 12 - 46 %   Lymphs Abs 0.8  0.7 - 4.0 K/uL   Monocytes Relative 2 (*) 3 - 12 %   Monocytes Absolute 0.3  0.1 - 1.0 K/uL   Eosinophils Relative 0  0 - 5 %   Eosinophils Absolute 0.0  0.0 - 0.7 K/uL   Basophils Relative 1  0 - 1 %   Basophils Absolute 0.1  0.0 - 0.1 K/uL  LIPASE, BLOOD     Status: None   Collection Time    02/04/13  1:21 PM      Result Value Range   Lipase 17  11 - 59 U/L  URINALYSIS, ROUTINE W REFLEX MICROSCOPIC     Status: Abnormal   Collection Time    02/04/13  4:28 PM      Result Value Range   Color, Urine YELLOW  YELLOW   APPearance CLEAR  CLEAR   Specific Gravity, Urine 1.016  1.005 - 1.030   pH 7.5  5.0 - 8.0   Glucose, UA 250 (*) NEGATIVE mg/dL   Hgb urine dipstick SMALL (*) NEGATIVE   Bilirubin Urine NEGATIVE  NEGATIVE   Ketones, ur >80 (*) NEGATIVE mg/dL   Protein, ur 30 (*) NEGATIVE mg/dL   Urobilinogen, UA 1.0  0.0 - 1.0 mg/dL   Nitrite NEGATIVE  NEGATIVE   Leukocytes, UA NEGATIVE  NEGATIVE  URINE MICROSCOPIC-ADD ON     Status: Abnormal   Collection Time    02/04/13  4:28 PM      Result Value Range   WBC, UA 0-2  <3 WBC/hpf    RBC / HPF 3-6  <3 RBC/hpf   Bacteria, UA MANY (*) RARE   Urine-Other MUCOUS PRESENT    OCCULT BLOOD, POC DEVICE     Status: None   Collection Time    02/04/13  4:34 PM      Result Value Range   Fecal Occult Bld NEGATIVE  NEGATIVE  LACTIC ACID, PLASMA     Status: None   Collection Time    02/04/13  4:59 PM      Result Value Range   Lactic Acid, Venous 1.5  0.5 - 2.2 mmol/L  TROPONIN I     Status: None   Collection Time    02/04/13  5:00 PM      Result Value Range   Troponin I <0.30  <0.30 ng/mL   Comment:            Due to the release kinetics of cTnI,     a negative result within the first hours     of the onset of symptoms does not rule out     myocardial infarction with certainty.     If myocardial infarction is still suspected,     repeat the test at appropriate intervals.  PROTIME-INR     Status: None   Collection Time    02/04/13  5:01 PM      Result Value Range   Prothrombin Time 12.9  11.6 -  15.2 seconds   INR 0.98  0.00 - 1.49  POCT GASTRIC OCCULT BLOOD (1-CARD TO LAB)     Status: None   Collection Time    02/04/13 10:47 PM      Result Value Range   Occult Blood, Gastric NEGATIVE  NEGATIVE    Ct Abdomen Pelvis W Contrast  02/04/2013   *RADIOLOGY REPORT*  Clinical Data: Abdominal pain and emesis  CT ABDOMEN AND PELVIS WITH CONTRAST  Technique:  Multidetector CT imaging of the abdomen and pelvis was performed following the standard protocol during bolus administration of intravenous contrast.  Contrast: 80mL OMNIPAQUE IOHEXOL 300 MG/ML  SOLN  Comparison: 06/18/2005  Findings: Scar versus atelectasis is noted in the right lung base. No pleural or pericardial effusion.  There is no focal liver abnormalities identified.  The gallbladder appears normal.  No biliary dilatation.  The pancreas is unremarkable.  Normal appearance of the spleen.  The adrenal glands are both normal.  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  This is unchanged from previous exam.  The left kidney is negative.  There is gas within the lumen of the urinary bladder.  There is a Foley catheter present.  Calcified atherosclerotic disease affects the abdominal aorta. There is no aneurysm.  There is no upper abdominal adenopathy.  No pelvic or inguinal adenopathy noted.  No free fluid or fluid collections identified within the abdomen or pelvis.  Normal appearance of the stomach.  The proximal small bowel loops are abnormally dilated measuring up to 4 cm.  Small bowel fluid levels are also present.  The mid and distal small bowel loops are collapsed.  Transition point is identified within the left iliac fossa, image number 45/series 2.  Gas and stool noted throughout the colon.  There is extensive colonic diverticulosis without evidence for diverticulitis.  There is no free intraperitoneal air identified within the abdomen or pelvis.  No evidence for bowel perforation or abscess formation.  Review of the visualized osseous structures is significant for multilevel degenerative disc disease.  IMPRESSION:  1.  Examination is positive for proximal small bowel obstruction. Transition point is at the level of the mid small bowel in the left iliac fossa.  2.  No evidence for perforation or abscess formation. 3.  Lumbar degenerative disc disease.   Original Report Authenticated By: Signa Kell, M.D.   Dg Abd Acute W/chest  02/04/2013   *RADIOLOGY REPORT*  Clinical Data: Abdominal pain  ACUTE ABDOMEN SERIES (ABDOMEN 2 VIEW & CHEST 1 VIEW)  Comparison: 08/14/2009  Findings: Heart size appears normal.  There is no pleural effusion or edema.  The lungs are hyperinflated and there is coarsened interstitial markings noted bilaterally.  Nasogastric tube is noted with side port below GE junction.  There is a moderate stool burden identified throughout the colon.  Dilated loop of small bowel is noted in the left lower  quadrant measuring up to 4.3 cm.  On the lateral decubitus radiograph there are multiple small bowel fluid levels.  No free intraperitoneal air is identified.  IMPRESSION:  1.  Dilated small bowel loops and fluid levels.  Cannot rule out to small bowel obstruction. 2.  No acute cardiopulmonary abnormalities.   Original Report Authenticated By: Signa Kell, M.D.    Review of Systems  Constitutional: Negative.   HENT: Negative.   Eyes: Negative.   Respiratory: Negative.   Cardiovascular: Negative.  Gastrointestinal: Positive for nausea and abdominal pain.  Genitourinary: Negative.   Musculoskeletal: Negative.   Skin: Negative.   Neurological: Negative.   Endo/Heme/Allergies: Negative.   Psychiatric/Behavioral: Negative.    Blood pressure 161/72, pulse 98, temperature 102.7 F (39.3 C), temperature source Oral, resp. rate 18, height 5\' 4"  (1.626 m), weight 101 lb 9.6 oz (46.085 kg), SpO2 94.00%. Physical Exam  Constitutional:  Elderly frail wf  HENT:  Head: Normocephalic and atraumatic.  Eyes: Conjunctivae and EOM are normal. Pupils are equal, round, and reactive to light.  Neck: Normal range of motion. Neck supple.  Cardiovascular: Normal rate, regular rhythm and normal heart sounds.   Respiratory: Effort normal and breath sounds normal.  GI: Soft. Bowel sounds are normal.  Mild tenderness. No guarding. Improved after ng was placed  Musculoskeletal: Normal range of motion.  Neurological: She is alert.  Skin: Skin is warm and dry.  Psychiatric: She has a normal mood and affect. Her behavior is normal.    Assessment/Plan: The pt appears to have an SBO. Given her age and comorbidities I think she would be high risk for surgery. Since her abdomen is so soft i think it would be reasonable to keep ng to suction and rest her bowels. Will follow with serial exams  TOTH III,PAUL S 02/05/2013, 12:59 AM

## 2013-02-05 NOTE — H&P (Signed)
[facility] History and Physical  Ashley Burnett ZOX:[id number] DOB: [birth year] DOA: 02/04/2013  Referring physician: EDP PCP: Loreen Freud, DO  Specialists:   Chief Complaint: Nausea and Vomiting and ABD Swelling and Pain  HPI: Ashley Burnett is a 79 y.o. female who presented to the ED with complaints of Nausea and Vomiting and ABD distention for the past 2 days.  She denies having hematemesis and also denies having fevers or chills, but she does report having LLQ ABD Pain. She has not been able to hold down foods or liquids during this time.   She was evaluated in the ED and an Acute ABD Series and a CT scan of the ABD revealed an Small Bowel Obstruction.  Dr. Carolynne Edouard of General Surgery was consulted to see the patient.    Review of Systems: The patient denies anorexia, fever, chills, weight loss, vision loss, decreased hearing, hoarseness, chest pain, syncope, dyspnea on exertion, peripheral edema, balance deficits, hemoptysis, diarrhea, melena, hematochezia, severe indigestion/heartburn, hematuria, incontinence, muscle weakness, suspicious skin lesions, transient blindness, difficulty walking, depression, unusual weight change, abnormal bleeding, enlarged lymph nodes, angioedema, and breast masses.    Past Medical History  Diagnosis Date  . Diverticulosis     universal,severe  . Family history of GERD   . Osteoporosis   . Transient ischemic attack   . Fracture     leg wrist  . High cholesterol   . Depression   . Gastritis      Past Surgical History  Procedure Laterality Date  . Abdominal hysterectomy    . Laparotomy  1960    SBO  . Ovarian cyst removal    . Tonsillectomy    . Appendectomy    . Abdominal adhesion surgery  1993    SBO      Medications:  HOME MEDS: Prior to Admission medications   Medication Sig Start Date End Date Taking? Authorizing Provider  amoxicillin (AMOXIL) 500 MG capsule Take 500 mg by mouth 3 (three) times daily.   Yes Historical Provider, MD   fluocinonide (LIDEX) 0.05 % cream Apply 1 application topically daily.     Yes Historical Provider, MD  HYDROcodone-acetaminophen (NORCO/VICODIN) 5-325 MG per tablet Take 1 tablet by mouth every 6 (six) hours as needed for pain.   Yes Historical Provider, MD    Allergies:  Allergies  Allergen Reactions  . Codeine     unknown  . Indomethacin     unknown  . Morphine And Related     unknown    Social History: Lives Alone and is Independent and able to perform all of her ADLs,  Daughter and Grand-Daughter live 3 blocks away.    reports that she has quit smoking. She has never used smokeless tobacco. She does not drink alcohol or use illicit drugs.  Family History: Family History  Problem Relation Age of Onset  . Stroke    . Colon cancer    . Heart disease      aunt  . Melanoma Mother   . Tuberculosis Father      Physical Exam:  GEN: Pleasant Elderly thin well developed 79 year old Caucasian Female examined  and in no acute distress; cooperative with exam Filed Vitals:   02/04/13 1556 02/04/13 1838 02/05/13 0007 02/05/13 0542  BP: 178/84 177/95 161/72 158/68  Pulse: 93 105 98 98  Temp: 97.6 F (36.4 C)  102.7 F (39.3 C) 99.3 F (37.4 C)  TempSrc: Oral     Resp:  20  18 18  Height:   5\' 4"  (1.626 m)   Weight:   46.085 kg (101 lb 9.6 oz)   SpO2: 99% 96% 94% 95%   Blood pressure 158/68, pulse 98, temperature 99.3 F (37.4 C), temperature source Oral, resp. rate 18, height 5\' 4"  (1.626 m), weight 46.085 kg (101 lb 9.6 oz), SpO2 95.00%. PSYCH: She is alert and oriented x4; does not appear anxious does not appear depressed; affect is normal HEENT: Normocephalic and Atraumatic, Mucous membranes pink; PERRLA; EOM intact; Fundi:  Benign;  No scleral icterus, Nares: +NGT present in Right Nare;   Left Nare:Patent, Oropharynx: Clear, Edentulous,  Neck:  FROM, no cervical lymphadenopathy nor thyromegaly or carotid bruit; no JVD; Breasts:: Not examined CHEST WALL: No  tenderness CHEST: Normal respiration, clear to auscultation bilaterally HEART: Regular rate and rhythm; no murmurs rubs or gallops BACK: No kyphosis or scoliosis; no CVA tenderness ABDOMEN: Decreased Bowel Sounds, +typany X4 Quadrants, No Fluid Wave,  soft non-tender; no masses, no organomegaly. Rectal Exam: Not done EXTREMITIES: No cyanosis, clubbing or edema; no ulcerations. Genitalia: not examined PULSES: 2+ and symmetric SKIN: Normal hydration no rash or ulceration CNS: Cranial nerves 2-12 grossly intact no focal neurologic deficit   Labs & Imaging Results for orders placed during the hospital encounter of 02/04/13 (from the past 48 hour(s))  COMPREHENSIVE METABOLIC PANEL     Status: Abnormal   Collection Time    02/04/13  1:21 PM      Result Value Range   Sodium 138  135 - 145 mEq/L   Potassium 4.6  3.5 - 5.1 mEq/L   Chloride 101  96 - 112 mEq/L   CO2 25  19 - 32 mEq/L   Glucose, Bld 151 (*) 70 - 99 mg/dL   BUN 11  6 - 23 mg/dL   Creatinine, Ser 7.82 (*) 0.50 - 1.10 mg/dL   Calcium 9.7  8.4 - 95.6 mg/dL   Total Protein 7.6  6.0 - 8.3 g/dL   Albumin 4.2  3.5 - 5.2 g/dL   AST 22  0 - 37 U/L   ALT 14  0 - 35 U/L   Alkaline Phosphatase 66  39 - 117 U/L   Total Bilirubin 0.6  0.3 - 1.2 mg/dL   GFR calc non Af Amer 84 (*) >90 mL/min   GFR calc Af Amer >90  >90 mL/min   Comment:            The eGFR has been calculated     using the CKD EPI equation.     This calculation has not been     validated in all clinical     situations.     eGFR's persistently     <90 mL/min signify     possible Chronic Kidney Disease.  CBC WITH DIFFERENTIAL     Status: Abnormal   Collection Time    02/04/13  1:21 PM      Result Value Range   WBC 10.8 (*) 4.0 - 10.5 K/uL   RBC 4.64  3.87 - 5.11 MIL/uL   Hemoglobin 14.0  12.0 - 15.0 g/dL   HCT 21.3  08.6 - 57.8 %   MCV 85.8  78.0 - 100.0 fL   MCH 30.2  26.0 - 34.0 pg   MCHC 35.2  30.0 - 36.0 g/dL   RDW 46.9  62.9 - 52.8 %   Platelets 266   150 - 400 K/uL   Neutrophils Relative % 90 (*) 43 -  77 %   Neutro Abs 9.7 (*) 1.7 - 7.7 K/uL   Lymphocytes Relative 7 (*) 12 - 46 %   Lymphs Abs 0.8  0.7 - 4.0 K/uL   Monocytes Relative 2 (*) 3 - 12 %   Monocytes Absolute 0.3  0.1 - 1.0 K/uL   Eosinophils Relative 0  0 - 5 %   Eosinophils Absolute 0.0  0.0 - 0.7 K/uL   Basophils Relative 1  0 - 1 %   Basophils Absolute 0.1  0.0 - 0.1 K/uL  LIPASE, BLOOD     Status: None   Collection Time    02/04/13  1:21 PM      Result Value Range   Lipase 17  11 - 59 U/L  URINALYSIS, ROUTINE W REFLEX MICROSCOPIC     Status: Abnormal   Collection Time    02/04/13  4:28 PM      Result Value Range   Color, Urine YELLOW  YELLOW   APPearance CLEAR  CLEAR   Specific Gravity, Urine 1.016  1.005 - 1.030   pH 7.5  5.0 - 8.0   Glucose, UA 250 (*) NEGATIVE mg/dL   Hgb urine dipstick SMALL (*) NEGATIVE   Bilirubin Urine NEGATIVE  NEGATIVE   Ketones, ur >80 (*) NEGATIVE mg/dL   Protein, ur 30 (*) NEGATIVE mg/dL   Urobilinogen, UA 1.0  0.0 - 1.0 mg/dL   Nitrite NEGATIVE  NEGATIVE   Leukocytes, UA NEGATIVE  NEGATIVE  URINE MICROSCOPIC-ADD ON     Status: Abnormal   Collection Time    02/04/13  4:28 PM      Result Value Range   WBC, UA 0-2  <3 WBC/hpf   RBC / HPF 3-6  <3 RBC/hpf   Bacteria, UA MANY (*) RARE   Urine-Other MUCOUS PRESENT    OCCULT BLOOD, POC DEVICE     Status: None   Collection Time    02/04/13  4:34 PM      Result Value Range   Fecal Occult Bld NEGATIVE  NEGATIVE  LACTIC ACID, PLASMA     Status: None   Collection Time    02/04/13  4:59 PM      Result Value Range   Lactic Acid, Venous 1.5  0.5 - 2.2 mmol/L  TROPONIN I     Status: None   Collection Time    02/04/13  5:00 PM      Result Value Range   Troponin I <0.30  <0.30 ng/mL   Comment:            Due to the release kinetics of cTnI,     a negative result within the first hours     of the onset of symptoms does not rule out     myocardial infarction with certainty.     If  myocardial infarction is still suspected,     repeat the test at appropriate intervals.  PROTIME-INR     Status: None   Collection Time    02/04/13  5:01 PM      Result Value Range   Prothrombin Time 12.9  11.6 - 15.2 seconds   INR 0.98  0.00 - 1.49  POCT GASTRIC OCCULT BLOOD (1-CARD TO LAB)     Status: None   Collection Time    02/04/13 10:47 PM      Result Value Range   Occult Blood, Gastric NEGATIVE  NEGATIVE  CBC     Status: None   Collection Time  02/05/13  5:55 AM      Result Value Range   WBC 10.5  4.0 - 10.5 K/uL   RBC 4.62  3.87 - 5.11 MIL/uL   Hemoglobin 13.8  12.0 - 15.0 g/dL   HCT 16.1  09.6 - 04.5 %   MCV 85.3  78.0 - 100.0 fL   MCH 29.9  26.0 - 34.0 pg   MCHC 35.0  30.0 - 36.0 g/dL   RDW 40.9  81.1 - 91.4 %   Platelets 248  150 - 400 K/uL    Radiological Exams on Admission: Ct Abdomen Pelvis W Contrast  02/04/2013   *RADIOLOGY REPORT*  Clinical Data: Abdominal pain and emesis  CT ABDOMEN AND PELVIS WITH CONTRAST  Technique:  Multidetector CT imaging of the abdomen and pelvis was performed following the standard protocol during bolus administration of intravenous contrast.  Contrast: 80mL OMNIPAQUE IOHEXOL 300 MG/ML  SOLN  Comparison: 06/18/2005  Findings: Scar versus atelectasis is noted in the right lung base. No pleural or pericardial effusion.  There is no focal liver abnormalities identified.  The gallbladder appears normal.  No biliary dilatation.  The pancreas is unremarkable.  Normal appearance of the spleen.  The adrenal glands are both normal.  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  This is unchanged from previous exam.  The left kidney is negative.  There is gas within the lumen of the urinary bladder.  There is a Foley catheter present.  Calcified atherosclerotic disease affects the abdominal aorta. There is no aneurysm.  There is no upper abdominal  adenopathy.  No pelvic or inguinal adenopathy noted.  No free fluid or fluid collections identified within the abdomen or pelvis.  Normal appearance of the stomach.  The proximal small bowel loops are abnormally dilated measuring up to 4 cm.  Small bowel fluid levels are also present.  The mid and distal small bowel loops are collapsed.  Transition point is identified within the left iliac fossa, image number 45/series 2.  Gas and stool noted throughout the colon.  There is extensive colonic diverticulosis without evidence for diverticulitis.  There is no free intraperitoneal air identified within the abdomen or pelvis.  No evidence for bowel perforation or abscess formation.  Review of the visualized osseous structures is significant for multilevel degenerative disc disease.  IMPRESSION:  1.  Examination is positive for proximal small bowel obstruction. Transition point is at the level of the mid small bowel in the left iliac fossa.  2.  No evidence for perforation or abscess formation. 3.  Lumbar degenerative disc disease.   Original Report Authenticated By: Signa Kell, M.D.   Dg Abd Acute W/chest  02/04/2013   *RADIOLOGY REPORT*  Clinical Data: Abdominal pain  ACUTE ABDOMEN SERIES (ABDOMEN 2 VIEW & CHEST 1 VIEW)  Comparison: 08/14/2009  Findings: Heart size appears normal.  There is no pleural effusion or edema.  The lungs are hyperinflated and there is coarsened interstitial markings noted bilaterally.  Nasogastric tube is noted with side port below GE junction.  There is a moderate stool burden identified throughout the colon.  Dilated loop of small bowel is noted in the left lower quadrant measuring up to 4.3 cm.  On the lateral decubitus radiograph there are multiple small bowel fluid levels.  No free intraperitoneal air is identified.  IMPRESSION:  1.  Dilated small bowel loops and fluid levels.  Cannot rule out  to small bowel obstruction. 2.  No acute cardiopulmonary abnormalities.   Original Report  Authenticated By: Signa Kell, M.D.      Assessment/Plan Principal Problem:   SBO (small bowel obstruction) Active Problems:   Nausea & vomiting   Abdominal pain, acute, left lower quadrant   1.   SBO-  NGT placed to LIMS, IVFs, and IV Protonix ordered.   General Surgery: Dr. Esperanza Sheets and saw patient .     2.   Nausea and Vomiting due to #1- PRN IV Zofran.   And NGTube until obstruction resolves.     3.   ABD pain on LLQ-  Pain control PRN and Monitor.       Code Status:  FULL CODE Family Communication:    Daughter at Bedside Disposition Plan:     TBA  Time spent: 70 Minutes  Ron Parker [facility] Pager [phone]  If 7PM-7AM, please contact night-coverage [email or web address] Password [facility] 02/05/2013, 6:38 AM

## 2013-02-05 NOTE — Progress Notes (Signed)
Subjective: Less abdominal pain  Objective: Vital signs in last 24 hours: Temp:  [97.6 F (36.4 C)-102.7 F (39.3 C)] 99.3 F (37.4 C) (05/17 0542) Pulse Rate:  [89-105] 98 (05/17 0542) Resp:  [18-20] 18 (05/17 0542) BP: (158-185)/(68-95) 158/68 mmHg (05/17 0542) SpO2:  [94 %-99 %] 95 % (05/17 0542) Weight:  [46.085 kg (101 lb 9.6 oz)] 46.085 kg (101 lb 9.6 oz) (05/17 0007) Last BM Date: 02/01/13  Intake/Output from previous day: 05/16 0701 - 05/17 0700 In: -  Out: 1775 [Urine:1625; Emesis/NG output:150] Intake/Output this shift:    General appearance: alert and cooperative Resp: clear to auscultation bilaterally Cardio: regular rate and rhythm GI: soft, tender LUQ without guarding, no generalized tenderness, few BS Neuro: awake and F/C, speech fluent  Lab Results:   Recent Labs  02/04/13 1321 02/05/13 0555  WBC 10.8* 10.5  HGB 14.0 13.8  HCT 39.8 39.4  PLT 266 248   BMET  Recent Labs  02/04/13 1321 02/05/13 0555  NA 138 136  K 4.6 3.4*  CL 101 99  CO2 25 20  GLUCOSE 151* 140*  BUN 11 10  CREATININE 0.46* 0.43*  CALCIUM 9.7 8.9   PT/INR  Recent Labs  02/04/13 1701  LABPROT 12.9  INR 0.98   ABG No results found for this basename: PHART, PCO2, PO2, HCO3,  in the last 72 hours  Studies/Results: Ct Abdomen Pelvis W Contrast  02/04/2013   *RADIOLOGY REPORT*  Clinical Data: Abdominal pain and emesis  CT ABDOMEN AND PELVIS WITH CONTRAST  Technique:  Multidetector CT imaging of the abdomen and pelvis was performed following the standard protocol during bolus administration of intravenous contrast.  Contrast: 80mL OMNIPAQUE IOHEXOL 300 MG/ML  SOLN  Comparison: 06/18/2005  Findings: Scar versus atelectasis is noted in the right lung base. No pleural or pericardial effusion.  There is no focal liver abnormalities identified.  The gallbladder appears normal.  No biliary dilatation.  The pancreas is unremarkable.  Normal appearance of the spleen.  The  adrenal glands are both normal.  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  This is unchanged from previous exam.  The left kidney is negative.  There is gas within the lumen of the urinary bladder.  There is a Foley catheter present.  Calcified atherosclerotic disease affects the abdominal aorta. There is no aneurysm.  There is no upper abdominal adenopathy.  No pelvic or inguinal adenopathy noted.  No free fluid or fluid collections identified within the abdomen or pelvis.  Normal appearance of the stomach.  The proximal small bowel loops are abnormally dilated measuring up to 4 cm.  Small bowel fluid levels are also present.  The mid and distal small bowel loops are collapsed.  Transition point is identified within the left iliac fossa, image number 45/series 2.  Gas and stool noted throughout the colon.  There is extensive colonic diverticulosis without evidence for diverticulitis.  There is no free intraperitoneal air identified within the abdomen or pelvis.  No evidence for bowel perforation or abscess formation.  Review of the visualized osseous structures is significant for multilevel degenerative disc disease.  IMPRESSION:  1.  Examination is positive for proximal small bowel obstruction. Transition point is at the level of the mid small bowel in the left iliac fossa.  2.  No evidence for perforation or abscess formation. 3.  Lumbar degenerative disc disease.   Original Report Authenticated  By: Signa Kell, M.D.   Dg Abd Acute W/chest  02/04/2013   *RADIOLOGY REPORT*  Clinical Data: Abdominal pain  ACUTE ABDOMEN SERIES (ABDOMEN 2 VIEW & CHEST 1 VIEW)  Comparison: 08/14/2009  Findings: Heart size appears normal.  There is no pleural effusion or edema.  The lungs are hyperinflated and there is coarsened interstitial markings noted bilaterally.  Nasogastric tube is noted with side port below GE junction.   There is a moderate stool burden identified throughout the colon.  Dilated loop of small bowel is noted in the left lower quadrant measuring up to 4.3 cm.  On the lateral decubitus radiograph there are multiple small bowel fluid levels.  No free intraperitoneal air is identified.  IMPRESSION:  1.  Dilated small bowel loops and fluid levels.  Cannot rule out to small bowel obstruction. 2.  No acute cardiopulmonary abnormalities.   Original Report Authenticated By: Signa Kell, M.D.    Anti-infectives: Anti-infectives   None      Assessment/Plan: SBO - improving somewhat clinically, NGT and IVF, I spoke to her regarding surgery should she not resolve the SBO and she does not want to go through that.  Hopefully will resolve with NGT/IVF Throat pain - due to NGT, cepacol+ice chips  LOS: 1 day    Ashley Burnett E 02/05/2013

## 2013-02-05 NOTE — Progress Notes (Signed)
[facility] PROGRESS NOTE  Ashley Burnett:[id number] DOB: 03/16/1918 DOA: 02/04/2013 PCP: Loreen Freud, DO  Assessment/Plan: 1. SBO- NGT placed to LIMS, IVFs, and IV Protonix ordered. General Surgery consulted. 2. Nausea and Vomiting due to #1- PRN IV Zofran. And NGTube until obstruction resolves.  3. ABD pain on LLQ- will consider stopping narcotics in hopes of promoting increased bowel motility. As such, we'll consider Toradol for pain control. Patient's renal function has the reviewed and is unremarkable.  Code Status: Full Family Communication: Family present in room (indicate person spoken with, relationship, and if by phone, the number) Disposition Plan: Pending   Consultants:  General surgery  Procedures:    Antibiotics:   (indicate start date, and stop date if known)  HPI/Subjective: Patient complains of nausea and mild lower abdominal discomfort.  Objective: Filed Vitals:   02/04/13 1838 02/05/13 0007 02/05/13 0542 02/05/13 1004  BP: 177/95 161/72 158/68 151/68  Pulse: 105 98 98 98  Temp:  102.7 F (39.3 C) 99.3 F (37.4 C) 98.8 F (37.1 C)  TempSrc:    Oral  Resp: 20 18 18 16   Height:  5\' 4"  (1.626 m)    Weight:  46.085 kg (101 lb 9.6 oz)    SpO2: 96% 94% 95% 96%    Intake/Output Summary (Last 24 hours) at 02/05/13 1337 Last data filed at 02/05/13 0600  Gross per 24 hour  Intake      0 ml  Output   1775 ml  Net  -1775 ml   Filed Weights   02/05/13 0007  Weight: 46.085 kg (101 lb 9.6 oz)    Exam:   General:  Patient's awake in no apparent distress, NG tube in place  Cardiovascular: Regular S1-S2  Respiratory: Normal respiratory effort, no crackles no wheezing  Abdomen: Decreased bowel sounds   Musculoskeletal: Perfused, no clubbing or cyanosis   Data Reviewed: Basic Metabolic Panel:  Recent Labs Lab 02/04/13 1321 02/05/13 0555  NA 138 136  K 4.6 3.4*  CL 101 99  CO2 25 20  GLUCOSE 151* 140*  BUN 11 10  CREATININE  0.46* 0.43*  CALCIUM 9.7 8.9   Liver Function Tests:  Recent Labs Lab 02/04/13 1321  AST 22  ALT 14  ALKPHOS 66  BILITOT 0.6  PROT 7.6  ALBUMIN 4.2    Recent Labs Lab 02/04/13 1321  LIPASE 17   No results found for this basename: AMMONIA,  in the last 168 hours CBC:  Recent Labs Lab 02/04/13 1321 02/05/13 0555  WBC 10.8* 10.5  NEUTROABS 9.7*  --   HGB 14.0 13.8  HCT 39.8 39.4  MCV 85.8 85.3  PLT 266 248   Cardiac Enzymes:  Recent Labs Lab 02/04/13 1700  TROPONINI <0.30   BNP (last 3 results) No results found for this basename: PROBNP,  in the last 8760 hours CBG: No results found for this basename: GLUCAP,  in the last 168 hours  No results found for this or any previous visit (from the past 240 hour(s)).   Studies: Ct Abdomen Pelvis W Contrast  02/04/2013   *RADIOLOGY REPORT*  Clinical Data: Abdominal pain and emesis  CT ABDOMEN AND PELVIS WITH CONTRAST  Technique:  Multidetector CT imaging of the abdomen and pelvis was performed following the standard protocol during bolus administration of intravenous contrast.  Contrast: 80mL OMNIPAQUE IOHEXOL 300 MG/ML  SOLN  Comparison: 06/18/2005  Findings: Scar versus atelectasis is noted in the right lung base. No pleural or pericardial effusion.  There is no focal liver abnormalities identified.  The gallbladder appears normal.  No biliary dilatation.  The pancreas is unremarkable.  Normal appearance of the spleen.  The adrenal glands are both normal.  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  This is unchanged from previous exam.  The left kidney is negative.  There is gas within the lumen of the urinary bladder.  There is a Foley catheter present.  Calcified atherosclerotic disease affects the abdominal aorta. There is no aneurysm.  There is no upper abdominal adenopathy.  No pelvic or inguinal adenopathy noted.  No  free fluid or fluid collections identified within the abdomen or pelvis.  Normal appearance of the stomach.  The proximal small bowel loops are abnormally dilated measuring up to 4 cm.  Small bowel fluid levels are also present.  The mid and distal small bowel loops are collapsed.  Transition point is identified within the left iliac fossa, image number 45/series 2.  Gas and stool noted throughout the colon.  There is extensive colonic diverticulosis without evidence for diverticulitis.  There is no free intraperitoneal air identified within the abdomen or pelvis.  No evidence for bowel perforation or abscess formation.  Review of the visualized osseous structures is significant for multilevel degenerative disc disease.  IMPRESSION:  1.  Examination is positive for proximal small bowel obstruction. Transition point is at the level of the mid small bowel in the left iliac fossa.  2.  No evidence for perforation or abscess formation. 3.  Lumbar degenerative disc disease.   Original Report Authenticated By: Signa Kell, M.D.   Dg Abd Acute W/chest  02/04/2013   *RADIOLOGY REPORT*  Clinical Data: Abdominal pain  ACUTE ABDOMEN SERIES (ABDOMEN 2 VIEW & CHEST 1 VIEW)  Comparison: 08/14/2009  Findings: Heart size appears normal.  There is no pleural effusion or edema.  The lungs are hyperinflated and there is coarsened interstitial markings noted bilaterally.  Nasogastric tube is noted with side port below GE junction.  There is a moderate stool burden identified throughout the colon.  Dilated loop of small bowel is noted in the left lower quadrant measuring up to 4.3 cm.  On the lateral decubitus radiograph there are multiple small bowel fluid levels.  No free intraperitoneal air is identified.  IMPRESSION:  1.  Dilated small bowel loops and fluid levels.  Cannot rule out to small bowel obstruction. 2.  No acute cardiopulmonary abnormalities.   Original Report Authenticated By: Signa Kell, M.D.    Scheduled  Meds: . pantoprazole (PROTONIX) IV  40 mg Intravenous Q12H   Continuous Infusions: . sodium chloride 75 mL/hr at 02/05/13 1236    Principal Problem:   SBO (small bowel obstruction) Active Problems:   Nausea & vomiting   Abdominal pain, acute, left lower quadrant    Time spent: 20 minutes    Royer Cristobal K  [facility] Pager [phone]. If 7PM-7AM, please contact night-coverage at [email or web address], password [facility] 02/05/2013, 1:37 PM  LOS: 1 day

## 2013-02-06 MED ORDER — HYDRALAZINE HCL 20 MG/ML IJ SOLN
5.0000 mg | INTRAMUSCULAR | Status: DC | PRN
  Administered 2013-02-06 (×2): 5 mg via INTRAVENOUS
  Filled 2013-02-06 (×2): qty 1

## 2013-02-06 NOTE — Progress Notes (Signed)
[facility] PROGRESS NOTE  Ashley Burnett FAO:[id number] DOB: Jul 08, 1918 DOA: 02/04/2013 PCP: Ashley Freud, DO  Assessment/Plan: 1. SBO- NGT placed to LIMS, IVFs, and IV Protonix ordered. General Surgery consulted. Stable. Overall unchanged.  2. Nausea and Vomiting due to #1- PRN IV Zofran. And NGTube until obstruction resolves.  3. ABD pain on LLQ- will consider stopping narcotics in hopes of promoting increased bowel motility. As such, we'll consider Toradol for pain control. Patient's renal function has the reviewed and is unremarkable.   Code Status: Full Family Communication: Family member in room (indicate person spoken with, relationship, and if by phone, the number) Disposition Plan: Pending   Consultants:  Surgery  Procedures:    Antibiotics:   (indicate start date, and stop date if known)  HPI/Subjective: Patient is without complaints this morning. No acute events overnight  Objective: Filed Vitals:   02/05/13 2140 02/06/13 0242 02/06/13 0534 02/06/13 1025  BP: 155/82 173/80 158/74 176/87  Pulse: 96 92 99 99  Temp: 98.1 F (36.7 C) 98.1 F (36.7 C) 98.1 F (36.7 C) 99.1 F (37.3 C)  TempSrc: Oral Oral Oral Oral  Resp: 18 18 18 18   Height:      Weight:      SpO2: 98% 97% 97% 96%    Intake/Output Summary (Last 24 hours) at 02/06/13 1048 Last data filed at 02/06/13 0600  Gross per 24 hour  Intake 2070.5 ml  Output   1425 ml  Net  645.5 ml   Filed Weights   02/05/13 0007  Weight: 46.085 kg (101 lb 9.6 oz)    Exam:   General:  Patient's awake no apparent stress  Cardiovascular: Regular S1-S2  Respiratory: Normal respiratory effort, no crackles no wheezing  Abdomen: Decreased bowel sounds, slightly tender  Musculoskeletal: Perfused, no clubbing or cyanosis   Data Reviewed: Basic Metabolic Panel:  Recent Labs Lab 02/04/13 1321 02/05/13 0555  NA 138 136  K 4.6 3.4*  CL 101 99  CO2 25 20  GLUCOSE 151* 140*  BUN 11 10   CREATININE 0.46* 0.43*  CALCIUM 9.7 8.9   Liver Function Tests:  Recent Labs Lab 02/04/13 1321  AST 22  ALT 14  ALKPHOS 66  BILITOT 0.6  PROT 7.6  ALBUMIN 4.2    Recent Labs Lab 02/04/13 1321  LIPASE 17   No results found for this basename: AMMONIA,  in the last 168 hours CBC:  Recent Labs Lab 02/04/13 1321 02/05/13 0555  WBC 10.8* 10.5  NEUTROABS 9.7*  --   HGB 14.0 13.8  HCT 39.8 39.4  MCV 85.8 85.3  PLT 266 248   Cardiac Enzymes:  Recent Labs Lab 02/04/13 1700  TROPONINI <0.30   BNP (last 3 results) No results found for this basename: PROBNP,  in the last 8760 hours CBG: No results found for this basename: GLUCAP,  in the last 168 hours  Recent Results (from the past 240 hour(s))  URINE CULTURE     Status: None   Collection Time    02/04/13  4:28 PM      Result Value Range Status   Specimen Description URINE, CATHETERIZED   Final   Special Requests NONE   Final   Culture  Setup Time 02/04/2013 17:05   Final   Colony Count NO GROWTH   Final   Culture NO GROWTH   Final   Report Status 02/05/2013 FINAL   Final     Studies: Ct Abdomen Pelvis W Contrast  02/04/2013   *  RADIOLOGY REPORT*  Clinical Data: Abdominal pain and emesis  CT ABDOMEN AND PELVIS WITH CONTRAST  Technique:  Multidetector CT imaging of the abdomen and pelvis was performed following the standard protocol during bolus administration of intravenous contrast.  Contrast: 80mL OMNIPAQUE IOHEXOL 300 MG/ML  SOLN  Comparison: 06/18/2005  Findings: Scar versus atelectasis is noted in the right lung base. No pleural or pericardial effusion.  There is no focal liver abnormalities identified.  The gallbladder appears normal.  No biliary dilatation.  The pancreas is unremarkable.  Normal appearance of the spleen.  The adrenal glands are both normal.  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  This is  unchanged from previous exam.  The left kidney is negative.  There is gas within the lumen of the urinary bladder.  There is a Foley catheter present.  Calcified atherosclerotic disease affects the abdominal aorta. There is no aneurysm.  There is no upper abdominal adenopathy.  No pelvic or inguinal adenopathy noted.  No free fluid or fluid collections identified within the abdomen or pelvis.  Normal appearance of the stomach.  The proximal small bowel loops are abnormally dilated measuring up to 4 cm.  Small bowel fluid levels are also present.  The mid and distal small bowel loops are collapsed.  Transition point is identified within the left iliac fossa, image number 45/series 2.  Gas and stool noted throughout the colon.  There is extensive colonic diverticulosis without evidence for diverticulitis.  There is no free intraperitoneal air identified within the abdomen or pelvis.  No evidence for bowel perforation or abscess formation.  Review of the visualized osseous structures is significant for multilevel degenerative disc disease.  IMPRESSION:  1.  Examination is positive for proximal small bowel obstruction. Transition point is at the level of the mid small bowel in the left iliac fossa.  2.  No evidence for perforation or abscess formation. 3.  Lumbar degenerative disc disease.   Original Report Authenticated By: Ashley Burnett, M.D.   Dg Abd Acute W/chest  02/04/2013   *RADIOLOGY REPORT*  Clinical Data: Abdominal pain  ACUTE ABDOMEN SERIES (ABDOMEN 2 VIEW & CHEST 1 VIEW)  Comparison: 08/14/2009  Findings: Heart size appears normal.  There is no pleural effusion or edema.  The lungs are hyperinflated and there is coarsened interstitial markings noted bilaterally.  Nasogastric tube is noted with side port below GE junction.  There is a moderate stool burden identified throughout the colon.  Dilated loop of small bowel is noted in the left lower quadrant measuring up to 4.3 cm.  On the lateral decubitus  radiograph there are multiple small bowel fluid levels.  No free intraperitoneal air is identified.  IMPRESSION:  1.  Dilated small bowel loops and fluid levels.  Cannot rule out to small bowel obstruction. 2.  No acute cardiopulmonary abnormalities.   Original Report Authenticated By: Ashley Burnett, M.D.    Scheduled Meds: . pantoprazole (PROTONIX) IV  40 mg Intravenous Q12H   Continuous Infusions: . sodium chloride 100 mL/hr at 02/06/13 1610    Principal Problem:   SBO (small bowel obstruction) Active Problems:   Nausea & vomiting   Abdominal pain, acute, left lower quadrant    Time spent: 25 minutes    CHIU, STEPHEN K  [facility] Pager [phone]. If 7PM-7AM, please contact night-coverage at [email or web address], password [facility] 02/06/2013, 10:48 AM  LOS: 2 days

## 2013-02-06 NOTE — Progress Notes (Signed)
Subjective: Still some upper abdominal pain, cepacol helped the throat pain  Objective: Vital signs in last 24 hours: Temp:  [98.1 F (36.7 C)-99 F (37.2 C)] 98.1 F (36.7 C) (05/18 0534) Pulse Rate:  [92-99] 99 (05/18 0534) Resp:  [16-18] 18 (05/18 0534) BP: (151-173)/(68-82) 158/74 mmHg (05/18 0534) SpO2:  [96 %-99 %] 97 % (05/18 0534) Last BM Date: 02/01/13  Intake/Output from previous day: 05/17 0701 - 05/18 0700 In: 2070.5 [I.V.:2070.5] Out: 1425 [Urine:1050; Emesis/NG output:375] Intake/Output this shift:    General appearance: alert and cooperative Resp: clear to auscultation bilaterally Cardio: S1, S2 normal GI: tender distended SB loops still palpable LUQ, no guarding, stable C/W yesterday  Lab Results:   Recent Labs  02/04/13 1321 02/05/13 0555  WBC 10.8* 10.5  HGB 14.0 13.8  HCT 39.8 39.4  PLT 266 248   BMET  Recent Labs  02/04/13 1321 02/05/13 0555  NA 138 136  K 4.6 3.4*  CL 101 99  CO2 25 20  GLUCOSE 151* 140*  BUN 11 10  CREATININE 0.46* 0.43*  CALCIUM 9.7 8.9   PT/INR  Recent Labs  02/04/13 1701  LABPROT 12.9  INR 0.98   ABG No results found for this basename: PHART, PCO2, PO2, HCO3,  in the last 72 hours  Studies/Results: Ct Abdomen Pelvis W Contrast  02/04/2013   *RADIOLOGY REPORT*  Clinical Data: Abdominal pain and emesis  CT ABDOMEN AND PELVIS WITH CONTRAST  Technique:  Multidetector CT imaging of the abdomen and pelvis was performed following the standard protocol during bolus administration of intravenous contrast.  Contrast: 80mL OMNIPAQUE IOHEXOL 300 MG/ML  SOLN  Comparison: 06/18/2005  Findings: Scar versus atelectasis is noted in the right lung base. No pleural or pericardial effusion.  There is no focal liver abnormalities identified.  The gallbladder appears normal.  No biliary dilatation.  The pancreas is unremarkable.  Normal appearance of the spleen.  The adrenal glands are both normal.  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  This is unchanged from previous exam.  The left kidney is negative.  There is gas within the lumen of the urinary bladder.  There is a Foley catheter present.  Calcified atherosclerotic disease affects the abdominal aorta. There is no aneurysm.  There is no upper abdominal adenopathy.  No pelvic or inguinal adenopathy noted.  No free fluid or fluid collections identified within the abdomen or pelvis.  Normal appearance of the stomach.  The proximal small bowel loops are abnormally dilated measuring up to 4 cm.  Small bowel fluid levels are also present.  The mid and distal small bowel loops are collapsed.  Transition point is identified within the left iliac fossa, image number 45/series 2.  Gas and stool noted throughout the colon.  There is extensive colonic diverticulosis without evidence for diverticulitis.  There is no free intraperitoneal air identified within the abdomen or pelvis.  No evidence for bowel perforation or abscess formation.  Review of the visualized osseous structures is significant for multilevel degenerative disc disease.  IMPRESSION:  1.  Examination is positive for proximal small bowel obstruction. Transition point is at the level of the mid small bowel in the left iliac fossa.  2.  No evidence for perforation or abscess formation. 3.  Lumbar degenerative disc disease.   Original Report Authenticated By: Signa Kell, M.D.   Dg Abd Acute W/chest  02/04/2013   *  RADIOLOGY REPORT*  Clinical Data: Abdominal pain  ACUTE ABDOMEN SERIES (ABDOMEN 2 VIEW & CHEST 1 VIEW)  Comparison: 08/14/2009  Findings: Heart size appears normal.  There is no pleural effusion or edema.  The lungs are hyperinflated and there is coarsened interstitial markings noted bilaterally.  Nasogastric tube is noted with side port below GE junction.  There is a moderate stool burden identified throughout the  colon.  Dilated loop of small bowel is noted in the left lower quadrant measuring up to 4.3 cm.  On the lateral decubitus radiograph there are multiple small bowel fluid levels.  No free intraperitoneal air is identified.  IMPRESSION:  1.  Dilated small bowel loops and fluid levels.  Cannot rule out to small bowel obstruction. 2.  No acute cardiopulmonary abnormalities.   Original Report Authenticated By: Signa Kell, M.D.    Anti-infectives: Anti-infectives   None      Assessment/Plan: SBO - patient does not want surgery, continue NGT and IVF Throat pain - due to NGT, cepacol+ice chips  LOS: 2 days    Herschel Fleagle E 02/06/2013

## 2013-02-07 ENCOUNTER — Inpatient Hospital Stay (HOSPITAL_COMMUNITY): Payer: Medicare Other

## 2013-02-07 MED ORDER — HYDRALAZINE HCL 20 MG/ML IJ SOLN
5.0000 mg | INTRAMUSCULAR | Status: DC | PRN
  Administered 2013-02-08 (×2): 5 mg via INTRAVENOUS
  Filled 2013-02-07 (×2): qty 1

## 2013-02-07 NOTE — Progress Notes (Signed)
INITIAL NUTRITION ASSESSMENT  DOCUMENTATION CODES Per approved criteria  -Underweight   INTERVENTION: 1.  Modify diet; as medically appropriate to Regular goal. 2.  GOC; for appropriate interventions  NUTRITION DIAGNOSIS: Inadequate oral intake related to inability to eat as evidenced by SBO.   Monitor:  1.  Food/Beverage; resume of PO diet once appropriate  Reason for Assessment: BMI  79 y.o. female  Admitting Dx: SBO (small bowel obstruction)  ASSESSMENT: Pt admitted with SBO.  Currently with NGT to suction.  Pt eating ice chips. Reports appetite and hunger at present.   Note ongoing discussion r/t to surgery vs. Continued conservative treatment.   Discussed nutrition hx with pt who reports she eats well at baseline but is not able to participate in reliable recall.  Pt states she has always been thin and has never weighed "more than 120 lbs pregnant."  Pt estimates she weighs 110 lbs, however wt per chart and obtained by RD at bedside is <110 lbs.   Height: Ht Readings from Last 1 Encounters:  02/05/13 5\' 4"  (1.626 m)    Weight: Wt Readings from Last 1 Encounters:  02/05/13 101 lb 9.6 oz (46.085 kg)    Ideal Body Weight: 120 lbs  % Ideal Body Weight: 84%  Wt Readings from Last 10 Encounters:  02/05/13 101 lb 9.6 oz (46.085 kg)  09/26/11 112 lb 9.6 oz (51.075 kg)  07/21/11 105 lb 3.2 oz (47.718 kg)  03/04/11 103 lb 12.8 oz (47.083 kg)  12/20/10 102 lb 9.6 oz (46.539 kg)  09/06/10 103 lb 9.6 oz (46.993 kg)  07/26/10 107 lb (48.535 kg)  05/10/10 105 lb (47.628 kg)  04/12/10 108 lb (48.988 kg)  02/21/10 108 lb (48.988 kg)    Usual Body Weight: 102-105 lbs  % Usual Body Weight: 99%  BMI:  Body mass index is 17.43 kg/(m^2).  Estimated Nutritional Needs: Kcal: 1150-1260 Protein: 46-55g Fluid: >1.2 L/day  Skin: intact  Diet Order: NPO  EDUCATION NEEDS: -Education needs addressed   Intake/Output Summary (Last 24 hours) at 02/07/13 1341 Last data  filed at 02/07/13 0519  Gross per 24 hour  Intake   2390 ml  Output   2725 ml  Net   -335 ml    Last BM: 5/13  Labs:   Recent Labs Lab 02/04/13 1321 02/05/13 0555  NA 138 136  K 4.6 3.4*  CL 101 99  CO2 25 20  BUN 11 10  CREATININE 0.46* 0.43*  CALCIUM 9.7 8.9  GLUCOSE 151* 140*    CBG (last 3)  No results found for this basename: GLUCAP,  in the last 72 hours  Scheduled Meds: . pantoprazole (PROTONIX) IV  40 mg Intravenous Q12H    Continuous Infusions: . sodium chloride 100 mL/hr at 02/07/13 0347    Past Medical History  Diagnosis Date  . Diverticulosis     universal,severe  . Family history of GERD   . Osteoporosis   . Transient ischemic attack   . Fracture     leg wrist  . High cholesterol   . Depression   . Gastritis     Past Surgical History  Procedure Laterality Date  . Abdominal hysterectomy    . Laparotomy  1960    SBO  . Ovarian cyst removal    . Tonsillectomy    . Appendectomy    . Abdominal adhesion surgery  1993    SBO     Loyce Dys, MS RD LDN Clinical Inpatient Dietitian Pager: [phone] Weekend/After  hours pager: [phone]

## 2013-02-07 NOTE — Progress Notes (Signed)
[facility] PROGRESS NOTE  MEHAK ROSKELLEY ZOX:[id number] DOB: September 20, 1918 DOA: 02/04/2013 PCP: Loreen Freud, DO  Assessment/Plan: 1. SBO- NGT placed to LIMS, IVFs, and IV Protonix ordered. General Surgery consulted. Overall unchanged. General surgery recommendations noted. Plans are for possible surgery if there remains no improvement in the next one to 2 days. We'll followup a followup KUB as per general surgery recommendation 2. Nausea and Vomiting due to #1- PRN IV Zofran. And NGTube until obstruction resolves.  3. ABD pain on LLQ- will consider stopping narcotics in hopes of promoting increased bowel motility. As such, we'll consider Toradol for pain control. Patient's renal function has the reviewed and is unremarkable.   Code Status: Full Family Communication: Patient's family in room (indicate person spoken with, relationship, and if by phone, the number) Disposition Plan: Pending   Consultants:  Surgery  Procedures:    Antibiotics:   (indicate start date, and stop date if known)  HPI/Subjective: Patient complains of a dry mouth. Otherwise no other complaint  Objective: Filed Vitals:   02/06/13 1700 02/06/13 2249 02/07/13 0226 02/07/13 0515  BP: 170/78 162/79 170/90 171/87  Pulse: 96 104 103 97  Temp: 98.9 F (37.2 C) 98.4 F (36.9 C) 98 F (36.7 C) 98.1 F (36.7 C)  TempSrc: Oral Oral Oral Oral  Resp: 16 18 18 18   Height:      Weight:      SpO2: 98% 98% 96% 96%    Intake/Output Summary (Last 24 hours) at 02/07/13 1200 Last data filed at 02/07/13 0519  Gross per 24 hour  Intake   2390 ml  Output   2725 ml  Net   -335 ml   Filed Weights   02/05/13 0007  Weight: 46.085 kg (101 lb 9.6 oz)    Exam:   General:  Patient is awake in no apparent distress  Cardiovascular: Regular S1-S2  Respiratory: Normal respiratory effort, no crackles no wheezing  Abdomen: Distended, tympanic, decreased bowel sound  Musculoskeletal: Perfused distally, no  clubbing or cyanosis   Data Reviewed: Basic Metabolic Panel:  Recent Labs Lab 02/04/13 1321 02/05/13 0555  NA 138 136  K 4.6 3.4*  CL 101 99  CO2 25 20  GLUCOSE 151* 140*  BUN 11 10  CREATININE 0.46* 0.43*  CALCIUM 9.7 8.9   Liver Function Tests:  Recent Labs Lab 02/04/13 1321  AST 22  ALT 14  ALKPHOS 66  BILITOT 0.6  PROT 7.6  ALBUMIN 4.2    Recent Labs Lab 02/04/13 1321  LIPASE 17   No results found for this basename: AMMONIA,  in the last 168 hours CBC:  Recent Labs Lab 02/04/13 1321 02/05/13 0555  WBC 10.8* 10.5  NEUTROABS 9.7*  --   HGB 14.0 13.8  HCT 39.8 39.4  MCV 85.8 85.3  PLT 266 248   Cardiac Enzymes:  Recent Labs Lab 02/04/13 1700  TROPONINI <0.30   BNP (last 3 results) No results found for this basename: PROBNP,  in the last 8760 hours CBG: No results found for this basename: GLUCAP,  in the last 168 hours  Recent Results (from the past 240 hour(s))  URINE CULTURE     Status: None   Collection Time    02/04/13  4:28 PM      Result Value Range Status   Specimen Description URINE, CATHETERIZED   Final   Special Requests NONE   Final   Culture  Setup Time 02/04/2013 17:05   Final   Colony Count NO  GROWTH   Final   Culture NO GROWTH   Final   Report Status 02/05/2013 FINAL   Final     Studies: Dg Abd 2 Views  02/07/2013   *RADIOLOGY REPORT*  Clinical Data: Small bowel obstruction  ABDOMEN - 2 VIEW  Comparison: 02/04/2013  Findings: NG tube in place noted.  Distended small bowel loops are noted in the left abdomen with air-fluid levels consistent with small bowel obstruction.  IMPRESSION: Distended small bowel loops in left hand with air-fluid levels consistent with small bowel obstruction.  NG tube in place.   Original Report Authenticated By: Natasha Mead, M.D.    Scheduled Meds: . pantoprazole (PROTONIX) IV  40 mg Intravenous Q12H   Continuous Infusions: . sodium chloride 100 mL/hr at 02/07/13 0347    Principal Problem:    SBO (small bowel obstruction) Active Problems:   Nausea & vomiting   Abdominal pain, acute, left lower quadrant    Time spent: 25 minutes    Roma Bondar K  [facility] Pager [phone]. If 7PM-7AM, please contact night-coverage at [email or web address], password [facility] 02/07/2013, 12:00 PM  LOS: 3 days

## 2013-02-07 NOTE — Progress Notes (Signed)
  Subjective: Pt with no bowel function.  Con't with abd distention  Objective: Vital signs in last 24 hours: Temp:  [98 F (36.7 C)-99.1 F (37.3 C)] 98.1 F (36.7 C) (05/19 0515) Pulse Rate:  [96-107] 97 (05/19 0515) Resp:  [16-18] 18 (05/19 0515) BP: (162-176)/(77-90) 171/87 mmHg (05/19 0515) SpO2:  [96 %-98 %] 96 % (05/19 0515) Last BM Date: 02/01/13  Intake/Output from previous day: 05/18 0701 - 05/19 0700 In: 2390 [I.V.:2390] Out: 2725 [Urine:2000; Emesis/NG output:725] Intake/Output this shift:    General appearance: alert and cooperative GI: soft, dist loop sof SB, active BS, no tinkles/rushes, no rebound/guarding  Lab Results:   Recent Labs  02/04/13 1321 02/05/13 0555  WBC 10.8* 10.5  HGB 14.0 13.8  HCT 39.8 39.4  PLT 266 248   BMET  Recent Labs  02/04/13 1321 02/05/13 0555  NA 138 136  K 4.6 3.4*  CL 101 99  CO2 25 20  GLUCOSE 151* 140*  BUN 11 10  CREATININE 0.46* 0.43*  CALCIUM 9.7 8.9   PT/INR  Recent Labs  02/04/13 1701  LABPROT 12.9  INR 0.98   ABG No results found for this basename: PHART, PCO2, PO2, HCO3,  in the last 72 hours  Studies/Results: No results found.  Anti-infectives: Anti-infectives   None      Assessment/Plan: s/p * No surgery found * Con't NGT KUB this AM She wants to avoid surgery, but if does not improve she will likely need OR in 1-2days  LOS: 3 days    Marigene Ehlers., [facility] 02/07/2013

## 2013-02-07 NOTE — Progress Notes (Signed)
Patient ID: Ashley Burnett, female   DOB: 1918/01/06, 79 y.o.   MRN: [id number] She has distended abdomen, mildly tender left mid abdomen, films still obstructed. I think she will need surgery.  She would like to discuss with her daughter. We will recheck tomorrow but need to decide certainly next 24-48 hours. If not better and no surgery, treatment would be palliative

## 2013-02-08 DIAGNOSIS — R112 Nausea with vomiting, unspecified: Secondary | ICD-10-CM

## 2013-02-08 DIAGNOSIS — R1032 Left lower quadrant pain: Secondary | ICD-10-CM

## 2013-02-08 DIAGNOSIS — K56609 Unspecified intestinal obstruction, unspecified as to partial versus complete obstruction: Secondary | ICD-10-CM

## 2013-02-08 MED ORDER — WHITE PETROLATUM GEL
Status: AC
  Filled 2013-02-08: qty 5

## 2013-02-08 MED ORDER — BIOTENE DRY MOUTH MT LIQD
15.0000 mL | Freq: Two times a day (BID) | OROMUCOSAL | Status: DC
  Administered 2013-02-08 – 2013-02-09 (×3): 15 mL via OROMUCOSAL

## 2013-02-08 MED ORDER — KCL IN DEXTROSE-NACL 40-5-0.45 MEQ/L-%-% IV SOLN
INTRAVENOUS | Status: DC
  Administered 2013-02-08 – 2013-02-09 (×2): via INTRAVENOUS
  Filled 2013-02-08 (×2): qty 1000

## 2013-02-08 NOTE — Progress Notes (Signed)
Patient ID: Ashley Burnett, female   DOB: 1918/07/26, 79 y.o.   MRN: [id number]    Subjective: Pt is clear right now.  Had a long d/w her about options.  She initially did not want an operation, then began to consider it.  Given her mental status though, she can't consent.  I have called the patient's daughter who wants to d/w her her daughter.  Then they would like to discuss this in person with the patient.  This can't be done til after 5 pm today.  No flatus  Objective: Vital signs in last 24 hours: Temp:  [94.3 F (34.6 C)-99.1 F (37.3 C)] 98 F (36.7 C) (05/20 0602) Pulse Rate:  [81-96] 87 (05/20 0602) Resp:  [16-18] 16 (05/20 0602) BP: (152-171)/(69-80) 171/78 mmHg (05/20 0602) SpO2:  [95 %-98 %] 98 % (05/20 0602) Last BM Date: 02/01/13  Intake/Output from previous day: 05/19 0701 - 05/20 0700 In: 2955 [P.O.:240; I.V.:2715] Out: 4175 [Urine:1900; Emesis/NG output:2275] Intake/Output this shift:    PE: Abd: soft, tender, still with palpable bowel distention, abdominal distention, few BS  Lab Results:  No results found for this basename: WBC, HGB, HCT, PLT,  in the last 72 hours BMET No results found for this basename: NA, K, CL, CO2, GLUCOSE, BUN, CREATININE, CALCIUM,  in the last 72 hours PT/INR No results found for this basename: LABPROT, INR,  in the last 72 hours CMP     Component Value Date/Time   NA 136 02/05/2013 0555   K 3.4* 02/05/2013 0555   CL 99 02/05/2013 0555   CO2 20 02/05/2013 0555   GLUCOSE 140* 02/05/2013 0555   GLUCOSE 104* 08/06/2006 1001   BUN 10 02/05/2013 0555   CREATININE 0.43* 02/05/2013 0555   CALCIUM 8.9 02/05/2013 0555   PROT 7.6 02/04/2013 1321   ALBUMIN 4.2 02/04/2013 1321   AST 22 02/04/2013 1321   ALT 14 02/04/2013 1321   ALKPHOS 66 02/04/2013 1321   BILITOT 0.6 02/04/2013 1321   GFRNONAA 86* 02/05/2013 0555   GFRAA >90 02/05/2013 0555   Lipase     Component Value Date/Time   LIPASE 17 02/04/2013 1321       Studies/Results: Dg Abd 2  Views  02/07/2013   *RADIOLOGY REPORT*  Clinical Data: Small bowel obstruction  ABDOMEN - 2 VIEW  Comparison: 02/04/2013  Findings: NG tube in place noted.  Distended small bowel loops are noted in the left abdomen with air-fluid levels consistent with small bowel obstruction.  IMPRESSION: Distended small bowel loops in left hand with air-fluid levels consistent with small bowel obstruction.  NG tube in place.   Original Report Authenticated By: Natasha Mead, M.D.    Anti-infectives: Anti-infectives   None       Assessment/Plan  1. SBO  Plan: 1. See above. Will wait for the family's decision.  Cont current treatment.   LOS: 4 days    Zade Falkner E 02/08/2013, 9:50 AM Pager: [phone]

## 2013-02-08 NOTE — Progress Notes (Addendum)
[facility] PROGRESS NOTE  Ashley Burnett:[id number] DOB: Mar 21, 1918 DOA: 02/04/2013 PCP: Loreen Freud, DO  Assessment/Plan: 1. SBO- NGT placed to LIMS, IVFs, and IV Protonix ordered. General Surgery consulted. Overall unchanged. General surgery recommendations noted. Plans are for possible surgery if there remains no improvement. However, pt is considered a surgical risk patient. Discussed case with family post family meeting with his surgeons. Family has decided to proceed with surgery tomorrow. Understand that the risk would be very high. 2. Nausea and Vomiting due to #1- PRN IV Zofran. And NGTube until obstruction resolves.  3. ABD pain on LLQ- stopped narcotics in hopes of promoting increased bowel motility. As such, we'll consider Toradol for pain control. 4.  Hypokalemia: Will  replace   Code Status: Full Family Communication: Family in room (indicate person spoken with, relationship, and if by phone, the number) Disposition Plan: Pending   Consultants: Surgery      HPI/Subjective: Patient is without complaint  Objective: Filed Vitals:   02/07/13 2129 02/08/13 0210 02/08/13 0602 02/08/13 1034  BP: 165/69 152/72 171/78   Pulse: 94 81 87 99  Temp: 97.7 F (36.5 C) 98.5 F (36.9 C) 98 F (36.7 C) 97.7 F (36.5 C)  TempSrc: Axillary Axillary Axillary Oral  Resp: 16 16 16 17   Height:      Weight:      SpO2: 97% 97% 98% 98%    Intake/Output Summary (Last 24 hours) at 02/08/13 1503 Last data filed at 02/08/13 1219  Gross per 24 hour  Intake   2775 ml  Output   3025 ml  Net   -250 ml   Filed Weights   02/05/13 0007  Weight: 46.085 kg (101 lb 9.6 oz)    Exam:   General:  Awake in no apparent distress, NG tube in place  Cardiovascular: Regular S1-S2  Respiratory: Normal respiratory effort, no crackles no wheezing  Abdomen: decreased bowel sounds, distended  Musculoskeletal: Perfused, no clubbing or cyanosis   Data Reviewed: Basic Metabolic  Panel:  Recent Labs Lab 02/04/13 1321 02/05/13 0555  NA 138 136  K 4.6 3.4*  CL 101 99  CO2 25 20  GLUCOSE 151* 140*  BUN 11 10  CREATININE 0.46* 0.43*  CALCIUM 9.7 8.9   Liver Function Tests:  Recent Labs Lab 02/04/13 1321  AST 22  ALT 14  ALKPHOS 66  BILITOT 0.6  PROT 7.6  ALBUMIN 4.2    Recent Labs Lab 02/04/13 1321  LIPASE 17   No results found for this basename: AMMONIA,  in the last 168 hours CBC:  Recent Labs Lab 02/04/13 1321 02/05/13 0555  WBC 10.8* 10.5  NEUTROABS 9.7*  --   HGB 14.0 13.8  HCT 39.8 39.4  MCV 85.8 85.3  PLT 266 248   Cardiac Enzymes:  Recent Labs Lab 02/04/13 1700  TROPONINI <0.30   BNP (last 3 results) No results found for this basename: PROBNP,  in the last 8760 hours CBG: No results found for this basename: GLUCAP,  in the last 168 hours  Recent Results (from the past 240 hour(s))  URINE CULTURE     Status: None   Collection Time    02/04/13  4:28 PM      Result Value Range Status   Specimen Description URINE, CATHETERIZED   Final   Special Requests NONE   Final   Culture  Setup Time 02/04/2013 17:05   Final   Colony Count NO GROWTH   Final   Culture NO  GROWTH   Final   Report Status 02/05/2013 FINAL   Final     Studies: Dg Abd 2 Views  02/07/2013   *RADIOLOGY REPORT*  Clinical Data: Small bowel obstruction  ABDOMEN - 2 VIEW  Comparison: 02/04/2013  Findings: NG tube in place noted.  Distended small bowel loops are noted in the left abdomen with air-fluid levels consistent with small bowel obstruction.  IMPRESSION: Distended small bowel loops in left hand with air-fluid levels consistent with small bowel obstruction.  NG tube in place.   Original Report Authenticated By: Natasha Mead, M.D.    Scheduled Meds: . antiseptic oral rinse  15 mL Mouth Rinse BID  . pantoprazole (PROTONIX) IV  40 mg Intravenous Q12H   Continuous Infusions: . sodium chloride 125 mL/hr at 02/08/13 2130    Principal Problem:   SBO  (small bowel obstruction) Active Problems:   Nausea & vomiting   Abdominal pain, acute, left lower quadrant    Time spent: 25 min    CHIU, STEPHEN K  [facility] Pager [phone]. If 7PM-7AM, please contact night-coverage at [email or web address], password [facility] 02/08/2013, 3:03 PM  LOS: 4 days

## 2013-02-08 NOTE — Progress Notes (Signed)
I think she needs elap with loa now.  She is no better since Friday.  Her exam is still distended with palpable bowel oop on left side We discussed this given her age and risks esp with quality of life postoperatively.  I don't know well she understands this.  Her daughter and granddaughter will be here later today and will discuss with all of them

## 2013-02-08 NOTE — Progress Notes (Signed)
Chaplain Note: Chaplain visited with pt who was resting in bed, awake, alert, and oriented.  Pt expressed fear that she was "not going to get better."  She was tearful and anxious.  Pt was unclear about which doctor(s) she had spoken with and what she had been told regarding her physical condition.  Chaplain provided spiritual comfort, support, and prayer, helping the pt to give voice to her anxiety, and to get her thinking with a more positive outlook toward recovery.  Anxiety may, of course, return and pt understands that she may call on chaplain for continued support.   Spiritual Assessment:  As stated above, pt is anxious, about her physical condition.  In her view, her health was fine until this hospitalization.  Previous notes have called the pt's mental state, with regard to decision making, into question.  While this chaplain is not qualified to make any clinical assessments in this regard, she was very clear in her conversation today. The pt is confused and troubled about the medical matters that have (or have not) been discussed with her.  From a spiritual standpoint, the pt needs time to internalize and come to grips with her condition/prognosis.  She welcomes prayer as help in her struggle.  Chaplain will follow up as needed.  02/08/13 1200  Clinical Encounter Type  Visited With Patient  Visit Type Spiritual support  Referral From Patient  Spiritual Encounters  Spiritual Needs Prayer;Emotional  Stress Factors  Patient Stress Factors Health changes;Lack of knowledge;Major life changes  Family Stress Factors None identified (No family present at this time.)  Verdie Shire, Chaplain [phone]

## 2013-02-09 ENCOUNTER — Inpatient Hospital Stay (HOSPITAL_COMMUNITY): Payer: Medicare Other | Admitting: Anesthesiology

## 2013-02-09 ENCOUNTER — Encounter (HOSPITAL_COMMUNITY): Admission: EM | Disposition: A | Discharge status: Skilled Nursing Facility | Payer: Self-pay | Source: Home / Self Care

## 2013-02-09 ENCOUNTER — Encounter (HOSPITAL_COMMUNITY): Payer: Self-pay | Admitting: Anesthesiology

## 2013-02-09 DIAGNOSIS — K565 Intestinal adhesions [bands], unspecified as to partial versus complete obstruction: Secondary | ICD-10-CM

## 2013-02-09 DIAGNOSIS — F411 Generalized anxiety disorder: Secondary | ICD-10-CM

## 2013-02-09 HISTORY — PX: COLON RESECTION: SHX5231

## 2013-02-09 HISTORY — PX: LAPAROTOMY: SHX154

## 2013-02-09 LAB — CBC
HCT: 42 % (ref 36.0–46.0)
Hemoglobin: 15 g/dL (ref 12.0–15.0)
MCV: 84.3 fL (ref 78.0–100.0)
RBC: 4.98 MIL/uL (ref 3.87–5.11)
RDW: 14.1 % (ref 11.5–15.5)
WBC: 9.2 10*3/uL (ref 4.0–10.5)

## 2013-02-09 LAB — BASIC METABOLIC PANEL
BUN: 13 mg/dL (ref 6–23)
CO2: 23 mEq/L (ref 19–32)
Chloride: 109 mEq/L (ref 96–112)
Chloride: 111 mEq/L (ref 96–112)
Creatinine, Ser: 0.36 mg/dL — ABNORMAL LOW (ref 0.50–1.10)
GFR calc Af Amer: >90 mL/min (ref >90)
GFR calc non Af Amer: >90 mL/min (ref >90)
Glucose, Bld: 166 mg/dL — ABNORMAL HIGH (ref 70–99)
Glucose, Bld: 178 mg/dL — ABNORMAL HIGH (ref 70–99)
Potassium: 2.5 mEq/L — CL (ref 3.5–5.1)
Sodium: 148 mEq/L — ABNORMAL HIGH (ref 135–145)

## 2013-02-09 LAB — ABO/RH: ABO/RH(D): A POS

## 2013-02-09 SURGERY — LAPAROTOMY, EXPLORATORY
Anesthesia: General | Site: Abdomen | Wound class: Clean Contaminated

## 2013-02-09 MED ORDER — FENTANYL CITRATE 0.05 MG/ML IJ SOLN
25.0000 ug | INTRAMUSCULAR | Status: DC | PRN
  Administered 2013-02-09 (×2): 25 ug via INTRAVENOUS

## 2013-02-09 MED ORDER — 0.9 % SODIUM CHLORIDE (POUR BTL) OPTIME
TOPICAL | Status: DC | PRN
  Administered 2013-02-09: 2000 mL

## 2013-02-09 MED ORDER — HYDROMORPHONE HCL PF 1 MG/ML IJ SOLN
INTRAMUSCULAR | Status: DC | PRN
  Administered 2013-02-09 (×2): 0.5 mg via INTRAVENOUS

## 2013-02-09 MED ORDER — HYDROMORPHONE HCL PF 1 MG/ML IJ SOLN
0.5000 mg | INTRAMUSCULAR | Status: DC | PRN
  Administered 2013-02-09 – 2013-02-16 (×5): 0.5 mg via INTRAVENOUS
  Filled 2013-02-09 (×5): qty 1

## 2013-02-09 MED ORDER — SUCCINYLCHOLINE CHLORIDE 20 MG/ML IJ SOLN
INTRAMUSCULAR | Status: DC | PRN
  Administered 2013-02-09: 100 mg via INTRAVENOUS

## 2013-02-09 MED ORDER — ALBUMIN HUMAN 5 % IV SOLN
INTRAVENOUS | Status: DC | PRN
  Administered 2013-02-09 (×2): via INTRAVENOUS

## 2013-02-09 MED ORDER — LIDOCAINE HCL (CARDIAC) 20 MG/ML IV SOLN
INTRAVENOUS | Status: DC | PRN
  Administered 2013-02-09: 30 mg via INTRAVENOUS

## 2013-02-09 MED ORDER — DEXTROSE 5 % IV SOLN
2.0000 g | INTRAVENOUS | Status: DC | PRN
  Administered 2013-02-09: 2 g via INTRAVENOUS

## 2013-02-09 MED ORDER — ETOMIDATE 2 MG/ML IV SOLN
INTRAVENOUS | Status: DC | PRN
  Administered 2013-02-09: 16 mg via INTRAVENOUS

## 2013-02-09 MED ORDER — LACTATED RINGERS IV SOLN
INTRAVENOUS | Status: DC
  Administered 2013-02-09: 10:00:00 via INTRAVENOUS

## 2013-02-09 MED ORDER — ACETAMINOPHEN 10 MG/ML IV SOLN
INTRAVENOUS | Status: DC | PRN
  Administered 2013-02-09: 1000 mg via INTRAVENOUS

## 2013-02-09 MED ORDER — LACTATED RINGERS IV SOLN
INTRAVENOUS | Status: DC | PRN
  Administered 2013-02-09: 11:00:00 via INTRAVENOUS

## 2013-02-09 MED ORDER — FENTANYL CITRATE 0.05 MG/ML IJ SOLN
INTRAMUSCULAR | Status: AC
  Filled 2013-02-09: qty 2

## 2013-02-09 MED ORDER — DEXTROSE 5 % IV SOLN
INTRAVENOUS | Status: AC
  Filled 2013-02-09 (×2): qty 1

## 2013-02-09 MED ORDER — FENTANYL CITRATE 0.05 MG/ML IJ SOLN
INTRAMUSCULAR | Status: DC | PRN
  Administered 2013-02-09: 50 ug via INTRAVENOUS
  Administered 2013-02-09: 100 ug via INTRAVENOUS
  Administered 2013-02-09 (×2): 25 ug via INTRAVENOUS
  Administered 2013-02-09: 50 ug via INTRAVENOUS

## 2013-02-09 MED ORDER — ONDANSETRON HCL 4 MG/2ML IJ SOLN
INTRAMUSCULAR | Status: DC | PRN
  Administered 2013-02-09: 4 mg via INTRAVENOUS

## 2013-02-09 MED ORDER — ACETAMINOPHEN 10 MG/ML IV SOLN
INTRAVENOUS | Status: AC
  Filled 2013-02-09: qty 100

## 2013-02-09 MED ORDER — POTASSIUM CHLORIDE 10 MEQ/100ML IV SOLN
10.0000 meq | INTRAVENOUS | Status: AC
  Administered 2013-02-09 (×4): 10 meq via INTRAVENOUS
  Filled 2013-02-09 (×4): qty 100

## 2013-02-09 MED ORDER — SODIUM CHLORIDE 0.9 % IV SOLN
INTRAVENOUS | Status: DC
  Administered 2013-02-09 – 2013-02-10 (×2): via INTRAVENOUS

## 2013-02-09 SURGICAL SUPPLY — 55 items
BLADE SURG ROTATE 9660 (MISCELLANEOUS) IMPLANT
CANISTER SUCTION 2500CC (MISCELLANEOUS) ×2 IMPLANT
CHLORAPREP W/TINT 26ML (MISCELLANEOUS) ×2 IMPLANT
CLOTH BEACON ORANGE TIMEOUT ST (SAFETY) ×2 IMPLANT
COVER SURGICAL LIGHT HANDLE (MISCELLANEOUS) ×2 IMPLANT
DRAPE LAPAROSCOPIC ABDOMINAL (DRAPES) ×2 IMPLANT
DRAPE UTILITY 15X26 W/TAPE STR (DRAPE) IMPLANT
DRAPE WARM FLUID 44X44 (DRAPE) ×2 IMPLANT
ELECT BLADE 6.5 EXT (BLADE) IMPLANT
ELECT CAUTERY BLADE 6.4 (BLADE) ×2 IMPLANT
ELECT REM PT RETURN 9FT ADLT (ELECTROSURGICAL) ×2
ELECTRODE REM PT RTRN 9FT ADLT (ELECTROSURGICAL) ×1 IMPLANT
GLOVE BIO SURGEON STRL SZ7 (GLOVE) ×2 IMPLANT
GLOVE BIO SURGEON STRL SZ8 (GLOVE) ×2 IMPLANT
GLOVE BIOGEL PI IND STRL 7.0 (GLOVE) ×3 IMPLANT
GLOVE BIOGEL PI IND STRL 7.5 (GLOVE) ×1 IMPLANT
GLOVE BIOGEL PI IND STRL 8 (GLOVE) ×1 IMPLANT
GLOVE BIOGEL PI INDICATOR 7.0 (GLOVE) ×3
GLOVE BIOGEL PI INDICATOR 7.5 (GLOVE) ×1
GLOVE BIOGEL PI INDICATOR 8 (GLOVE) ×1
GLOVE SURG SS PI 7.0 STRL IVOR (GLOVE) ×4 IMPLANT
GLOVE SURG SS PI 7.5 STRL IVOR (GLOVE) ×2 IMPLANT
GOWN PREVENTION PLUS XLARGE (GOWN DISPOSABLE) ×2 IMPLANT
GOWN STRL NON-REIN LRG LVL3 (GOWN DISPOSABLE) ×6 IMPLANT
KIT BASIN OR (CUSTOM PROCEDURE TRAY) ×2 IMPLANT
KIT ROOM TURNOVER OR (KITS) ×2 IMPLANT
LIGASURE IMPACT 36 18CM CVD LR (INSTRUMENTS) IMPLANT
NS IRRIG 1000ML POUR BTL (IV SOLUTION) ×4 IMPLANT
PACK GENERAL/GYN (CUSTOM PROCEDURE TRAY) ×2 IMPLANT
PAD ARMBOARD 7.5X6 YLW CONV (MISCELLANEOUS) ×2 IMPLANT
RELOAD PROXIMATE 75MM BLUE (ENDOMECHANICALS) ×4 IMPLANT
SPECIMEN JAR LARGE (MISCELLANEOUS) ×2 IMPLANT
SPONGE GAUZE 4X4 12PLY (GAUZE/BANDAGES/DRESSINGS) ×2 IMPLANT
SPONGE LAP 18X18 X RAY DECT (DISPOSABLE) IMPLANT
STAPLER GUN LINEAR PROX 60 (STAPLE) ×2 IMPLANT
STAPLER PROXIMATE 75MM BLUE (STAPLE) ×2 IMPLANT
STAPLER VISISTAT 35W (STAPLE) ×2 IMPLANT
SUCTION POOLE TIP (SUCTIONS) ×2 IMPLANT
SUT NOVA 1 T20/GS 25DT (SUTURE) ×4 IMPLANT
SUT PDS AB 1 TP1 96 (SUTURE) ×4 IMPLANT
SUT SILK 2 0 (SUTURE) ×1
SUT SILK 2 0 SH CR/8 (SUTURE) ×2 IMPLANT
SUT SILK 2-0 18XBRD TIE 12 (SUTURE) ×1 IMPLANT
SUT SILK 3 0 (SUTURE) ×1
SUT SILK 3 0 SH CR/8 (SUTURE) ×2 IMPLANT
SUT SILK 3-0 18XBRD TIE 12 (SUTURE) ×1 IMPLANT
SUT VIC AB 2-0 CT1 27 (SUTURE) ×1
SUT VIC AB 2-0 CT1 TAPERPNT 27 (SUTURE) ×1 IMPLANT
SUT VIC AB 3-0 SH 27 (SUTURE)
SUT VIC AB 3-0 SH 27X BRD (SUTURE) IMPLANT
TAPE CLOTH SURG 4X10 WHT LF (GAUZE/BANDAGES/DRESSINGS) ×2 IMPLANT
TOWEL OR 17X26 10 PK STRL BLUE (TOWEL DISPOSABLE) ×2 IMPLANT
TRAY FOLEY CATH 14FRSI W/METER (CATHETERS) IMPLANT
WATER STERILE IRR 1000ML POUR (IV SOLUTION) IMPLANT
YANKAUER SUCT BULB TIP NO VENT (SUCTIONS) IMPLANT

## 2013-02-09 NOTE — Progress Notes (Signed)
Rec'd call from Lab, K is 2.5 at this time, Dr.Wakefield notified at this time, new orders rec'd, will adminster IV Potassium when available from Pharmacy

## 2013-02-09 NOTE — Progress Notes (Signed)
NUTRITION FOLLOW UP  Intervention:   1.  Modify diet; as medically appropriate to Regular goal based on GI function.  Consider early enteral trickle feeds via NGT with Vital 1.2 to promote gut motility.  2.  GOC; for appropriate nutrition-related interventions  Nutrition Dx:   Inadequate oral intake, ongoing.  Monitor:   1.  Food/Beverage; resume of PO diet once appropriate.  Not met, ongoing.   Assessment:   Pt admitted with SBO. Currently in OR for surgical management.   Pt at risk for malnutrition given prolonged NPO status with poor body stores and underweight per BMI on admission.  Height: Ht Readings from Last 1 Encounters:  02/05/13 5\' 4"  (1.626 m)    Weight Status:   Wt Readings from Last 1 Encounters:  02/05/13 101 lb 9.6 oz (46.085 kg)    Re-estimated needs:  Kcal: 1150-1260 Protein: 46-55g Fluid: >1.2 L/day  Skin: intact  Diet Order: NPO   Intake/Output Summary (Last 24 hours) at 02/09/13 1157 Last data filed at 02/09/13 1145  Gross per 24 hour  Intake 858.33 ml  Output   2950 ml  Net -2091.67 ml    Last BM: PTA   Labs:   Recent Labs Lab 02/04/13 1321 02/05/13 0555 02/09/13 0800  NA 138 136 147*  K 4.6 3.4* 3.1*  CL 101 99 109  CO2 25 20 23   BUN 11 10 13   CREATININE 0.46* 0.43* 0.36*  CALCIUM 9.7 8.9 8.6  GLUCOSE 151* 140* 166*    CBG (last 3)  No results found for this basename: GLUCAP,  in the last 72 hours  Scheduled Meds: . acetaminophen      . [facility] HOLD] antiseptic oral rinse  15 mL Mouth Rinse BID  . cefOXitin (MEFOXIN) IVPB 1 gram/50 mL D5W (Pyxis)      . [MAR HOLD] pantoprazole (PROTONIX) IV  40 mg Intravenous Q12H    Continuous Infusions: . sodium chloride 125 mL/hr at 02/08/13 1623  . dextrose 5 % and 0.45 % NaCl with KCl 40 mEq/L 50 mL/hr at 02/09/13 0329  . lactated ringers 50 mL/hr at 02/09/13 0950   Loyce Dys, MS RD LDN Clinical Inpatient Dietitian Pager: [phone] Weekend/After hours pager: [phone]

## 2013-02-09 NOTE — Progress Notes (Signed)
K returned 2.5 called to Dr. Dwain Sarna

## 2013-02-09 NOTE — Progress Notes (Signed)
  Subjective: Unchanged, feels miserable, no flatus/bm  Objective: Vital signs in last 24 hours: Temp:  [97.7 F (36.5 C)-98.5 F (36.9 C)] 98.5 F (36.9 C) (05/21 1610) Pulse Rate:  [91-110] 91 (05/21 0613) Resp:  [16-18] 16 (05/21 0613) BP: (158-179)/(71-81) 158/71 mmHg (05/21 0613) SpO2:  [96 %-98 %] 98 % (05/21 9604) Last BM Date: 02/01/13  Intake/Output from previous day: 05/20 0701 - 05/21 0700 In: 608.3 [I.V.:608.3] Out: 2600 [Urine:1450; Emesis/NG output:1150] Intake/Output this shift:    General appearance: no distress Resp: clear to auscultation bilaterally Cardio: regular rate and rhythm GI: distended, mildly tender left side, palpable bowel loops, no bs, ng with bilious drainage   Studies/Results: Dg Abd 2 Views  02-25-2013   *RADIOLOGY REPORT*  Clinical Data: Small bowel obstruction  ABDOMEN - 2 VIEW  Comparison: 02/04/2013  Findings: NG tube in place noted.  Distended small bowel loops are noted in the left abdomen with air-fluid levels consistent with small bowel obstruction.  IMPRESSION: Distended small bowel loops in left hand with air-fluid levels consistent with small bowel obstruction.  NG tube in place.   Original Report Authenticated By: Natasha Mead, M.D.    Assessment/Plan: SBO  I think we need to proceed either with palliative care or with surgery today.  Surgery is only chance to relieve problem.  I have spoken to her daughter again today and they would like to proceed with elap and loa.  We have discussed at lengths risks of surgery but there is no other choice except hospice right now which they would like not to do.  We will proceed today.  [facility] 02/09/2013

## 2013-02-09 NOTE — Anesthesia Postprocedure Evaluation (Signed)
  Anesthesia Post-op Note  Patient: Ashley Burnett  Procedure(s) Performed: Procedure(s): EXPLORATORY LAPAROTOMY (N/A) COLON RESECTION (N/A)  Patient Location: PACU  Anesthesia Type:General  Level of Consciousness: awake  Airway and Oxygen Therapy: Patient Spontanous Breathing  Post-op Pain: mild  Post-op Assessment: Post-op Vital signs reviewed  Post-op Vital Signs: Reviewed  Complications: No apparent anesthesia complications

## 2013-02-09 NOTE — Transfer of Care (Signed)
Immediate Anesthesia Transfer of Care Note  Patient: Ashley Burnett  Procedure(s) Performed: Procedure(s): EXPLORATORY LAPAROTOMY (N/A) COLON RESECTION (N/A)  Patient Location: PACU  Anesthesia Type:General  Level of Consciousness: awake, patient cooperative and confused  Airway & Oxygen Therapy: Patient Spontanous Breathing and Patient connected to nasal cannula oxygen  Post-op Assessment: Report given to PACU RN, Post -op Vital signs reviewed and stable and Patient moving all extremities  Post vital signs: Reviewed and stable  Complications: No apparent anesthesia complications

## 2013-02-09 NOTE — Progress Notes (Signed)
Patient ID: Ashley Burnett  female  WGN:[id number]    DOB: November 05, 1917    DOA: 02/04/2013  PCP: Loreen Freud, DO  Assessment/Plan: Principal Problem:   SBO (small bowel obstruction) - Patient seen earlier this morning, on NG T., [facility] had seen the patient and recommended surgery today  Active Problems:   Nausea & vomiting: Surgery today planned, NGT    Abdominal pain, acute, left lower quadrant - Per [facility], surgery today  DVT Prophylaxis:  Code Status:  Disposition:    Subjective: Patient seen earlier this morning, decision about surgery made, patient wondering when to go home  Objective: Weight change:   Intake/Output Summary (Last 24 hours) at 02/09/13 1440 Last data filed at 02/09/13 1330  Gross per 24 hour  Intake 1608.33 ml  Output   3050 ml  Net -1441.67 ml   Blood pressure 128/57, pulse 79, temperature 98.4 F (36.9 C), temperature source Oral, resp. rate 13, height 5\' 4"  (1.626 m), weight 46.085 kg (101 lb 9.6 oz), SpO2 99.00%.  Physical Exam: General: Alert and awake, not in any acute distress, NGT. CVS: S1-S2 clear, no murmur rubs or gallops Chest: clear to auscultation bilaterally, no wheezing, rales or rhonchi Abdomen: Decreased bowel sounds, distended Extremities: no cyanosis, clubbing or edema noted bilaterally   Lab Results: Basic Metabolic Panel:  Recent Labs Lab 02/09/13 0800 02/09/13 1254  NA 147* 148*  K 3.1* 2.5*  CL 109 111  CO2 23 22  GLUCOSE 166* 178*  BUN 13 12  CREATININE 0.36* 0.35*  CALCIUM 8.6 8.0*   Liver Function Tests:  Recent Labs Lab 02/04/13 1321  AST 22  ALT 14  ALKPHOS 66  BILITOT 0.6  PROT 7.6  ALBUMIN 4.2    Recent Labs Lab 02/04/13 1321  LIPASE 17   No results found for this basename: AMMONIA,  in the last 168 hours CBC:  Recent Labs Lab 02/04/13 1321 02/05/13 0555 02/09/13 0800  WBC 10.8* 10.5 9.2  NEUTROABS 9.7*  --   --   HGB 14.0 13.8 15.0  HCT 39.8 39.4 42.0  MCV 85.8 85.3 84.3  PLT 266  248 193   Cardiac Enzymes:  Recent Labs Lab 02/04/13 1700  TROPONINI <0.30   BNP: No components found with this basename: POCBNP,  CBG: No results found for this basename: GLUCAP,  in the last 168 hours   Micro Results: Recent Results (from the past 240 hour(s))  URINE CULTURE     Status: None   Collection Time    02/04/13  4:28 PM      Result Value Range Status   Specimen Description URINE, CATHETERIZED   Final   Special Requests NONE   Final   Culture  Setup Time 02/04/2013 17:05   Final   Colony Count NO GROWTH   Final   Culture NO GROWTH   Final   Report Status 02/05/2013 FINAL   Final    Studies/Results: Ct Abdomen Pelvis W Contrast  02/04/2013   *RADIOLOGY REPORT*  Clinical Data: Abdominal pain and emesis  CT ABDOMEN AND PELVIS WITH CONTRAST  Technique:  Multidetector CT imaging of the abdomen and pelvis was performed following the standard protocol during bolus administration of intravenous contrast.  Contrast: 80mL OMNIPAQUE IOHEXOL 300 MG/ML  SOLN  Comparison: 06/18/2005  Findings: Scar versus atelectasis is noted in the right lung base. No pleural or pericardial effusion.  There is no focal liver abnormalities identified.  The gallbladder appears normal.  No biliary dilatation.  The  pancreas is unremarkable.  Normal appearance of the spleen.  The adrenal glands are both normal.  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  This is unchanged from previous exam.  The left kidney is negative.  There is gas within the lumen of the urinary bladder.  There is a Foley catheter present.  Calcified atherosclerotic disease affects the abdominal aorta. There is no aneurysm.  There is no upper abdominal adenopathy.  No pelvic or inguinal adenopathy noted.  No free fluid or fluid collections identified within the abdomen or pelvis.  Normal appearance of the stomach.  The proximal small  bowel loops are abnormally dilated measuring up to 4 cm.  Small bowel fluid levels are also present.  The mid and distal small bowel loops are collapsed.  Transition point is identified within the left iliac fossa, image number 45/series 2.  Gas and stool noted throughout the colon.  There is extensive colonic diverticulosis without evidence for diverticulitis.  There is no free intraperitoneal air identified within the abdomen or pelvis.  No evidence for bowel perforation or abscess formation.  Review of the visualized osseous structures is significant for multilevel degenerative disc disease.  IMPRESSION:  1.  Examination is positive for proximal small bowel obstruction. Transition point is at the level of the mid small bowel in the left iliac fossa.  2.  No evidence for perforation or abscess formation. 3.  Lumbar degenerative disc disease.   Original Report Authenticated By: Signa Kell, M.D.   Dg Abd 2 Views  02/07/2013   *RADIOLOGY REPORT*  Clinical Data: Small bowel obstruction  ABDOMEN - 2 VIEW  Comparison: 02/04/2013  Findings: NG tube in place noted.  Distended small bowel loops are noted in the left abdomen with air-fluid levels consistent with small bowel obstruction.  IMPRESSION: Distended small bowel loops in left hand with air-fluid levels consistent with small bowel obstruction.  NG tube in place.   Original Report Authenticated By: Natasha Mead, M.D.   Dg Abd Acute W/chest  02/04/2013   *RADIOLOGY REPORT*  Clinical Data: Abdominal pain  ACUTE ABDOMEN SERIES (ABDOMEN 2 VIEW & CHEST 1 VIEW)  Comparison: 08/14/2009  Findings: Heart size appears normal.  There is no pleural effusion or edema.  The lungs are hyperinflated and there is coarsened interstitial markings noted bilaterally.  Nasogastric tube is noted with side port below GE junction.  There is a moderate stool burden identified throughout the colon.  Dilated loop of small bowel is noted in the left lower quadrant measuring up to 4.3 cm.   On the lateral decubitus radiograph there are multiple small bowel fluid levels.  No free intraperitoneal air is identified.  IMPRESSION:  1.  Dilated small bowel loops and fluid levels.  Cannot rule out to small bowel obstruction. 2.  No acute cardiopulmonary abnormalities.   Original Report Authenticated By: Signa Kell, M.D.    Medications: Scheduled Meds: . acetaminophen      . cefOXitin (MEFOXIN) IVPB 1 gram/50 mL D5W (Pyxis)      . fentaNYL      . pantoprazole (PROTONIX) IV  40 mg Intravenous Q12H  . potassium chloride  10 mEq Intravenous Q1 Hr x 4      LOS: 5 days   RAI,RIPUDEEP M.D. [facility] 02/09/2013, 2:40 PM Pager: [phone]  If 7PM-7AM, please contact night-coverage [email or web address] Password [email or web address]

## 2013-02-09 NOTE — Anesthesia Procedure Notes (Signed)
Procedure Name: Intubation Date/Time: 02/09/2013 11:06 AM Performed by: Orlinda Blalock, Naketa Daddario L Pre-anesthesia Checklist: Patient identified, Emergency Drugs available, Suction available, Patient being monitored and Timeout performed Patient Re-evaluated:Patient Re-evaluated prior to inductionOxygen Delivery Method: Circle system utilized Preoxygenation: Pre-oxygenation with 100% oxygen Intubation Type: IV induction Grade View: Grade II Tube size: 7.5 mm Number of attempts: 1 Airway Equipment and Method: Stylet and Video-laryngoscopy Placement Confirmation: ETT inserted through vocal cords under direct vision,  positive ETCO2 and breath sounds checked- equal and bilateral Secured at: 20 cm Tube secured with: Tape Dental Injury: Teeth and Oropharynx as per pre-operative assessment  Difficulty Due To: Difficult Airway- due to limited oral opening Future Recommendations: Recommend- induction with short-acting agent, and alternative techniques readily available Comments: Atraumatic

## 2013-02-09 NOTE — Op Note (Signed)
Preoperative diagnosis: Small bowel obstruction Postoperative diagnosis: Adhesive small bowel obstruction Procedure: #1 exploratory laparotomy with lysis of adhesions #2 small bowel resection with primary anastomosis Surgeon: Dr. Harden Mo Asst.: Dr. Violeta Gelinas Anesthesia: Gen. Endotracheal Specimens: Small bowel to pathology Estimated blood loss: 50 cc Complications: None Drains: None Disposition to recovery in stable condition Sponge and needle count correct at end of operation  Indications: This is a 79 year old female with dementia who presents with what appeared to be an adhesive small bowel obstruction. We made every attempt to manage her conservatively. She was not agreeable to undergoing a procedure for this. After a long conversation with the patient, her daughter, and her granddaughter we decided to proceed with surgery as we discussed the options at this point either be hospice care at home or surgery. We had a lengthy conversation about the risks of surgery as well as the postoperative recovery her functional status postop.  Procedure: After informed consent was obtained from the patient's daughter she went to the operating room. She was administered cefoxitin. Sequential compression devices were on her legs. She was then placed under general endotracheal anesthesia without complication. She already had a Foley catheter and nasogastric tube in place. Her abdomen was then prepped and draped in the standard sterile surgical fashion. A surgical timeout was then performed.  I made a midline incision. I entered into the peritoneum in the upper midline above where her prior incision was. I removed a fair amount of old stitch material. I opened the incision in its entirety without any injury. I then used the Metzenbaum scissors to perform adhesiolysis to enter the intra-abdominal cavity completely. She was noted to have dilated bowel and there was a single band that was kinked around  the bowel causing a complete obstruction. I lysed this with scissors and this was released. This area had some necrosis even though intestinal contents flowed through it. I did spend more time lysing adhesions and the remainder of the abdomen looked okay. I did not lyse all of the adhesions due to the fact that I found the problem and wanted to proceed out of the operating room as quickly as possible given the patient's status. The area where the obstruction had been needed to be resected. I then used a GIA stapler to divide the intestine on either side of this. I then oversewed the mesentery and remove this. I then approximated the 2 ends with 3-0 silk suture. I made enterotomies in both. I created an anastomosis with the GIA stapler. This was hemostatic. I then closes with a TA stapler. I oversewed some of the staple line with 3-0 silk. I closed the defect with silk suture as well. This bowel was all viable and the anastomosis was patent. Irrigation was performed. I brought the colon and some of the omentum down overlying this. Hemostasis was observed. I then closed the abdominal wall with #1 loop PDS and some interrupted Novafil sutures. I left the wound open and placed gauze and this. Dressings were placed. She tolerated this well and transferred to recovery.

## 2013-02-09 NOTE — Anesthesia Preprocedure Evaluation (Signed)
Anesthesia Evaluation  Patient identified by MRN, date of birth, ID band Patient awake    Reviewed: Allergy & Precautions, H&P , NPO status , Patient's Chart, lab work & pertinent test results  Airway Mallampati: II    Mouth opening: Limited Mouth Opening  Dental   Pulmonary shortness of breath,  breath sounds clear to auscultation        Cardiovascular + DOE Rhythm:Regular Rate:Normal     Neuro/Psych Anxiety Depression    GI/Hepatic Neg liver ROS, GERD-  ,  Endo/Other  negative endocrine ROS  Renal/GU negative Renal ROS     Musculoskeletal   Abdominal   Peds  Hematology   Anesthesia Other Findings   Reproductive/Obstetrics                           Anesthesia Physical Anesthesia Plan  ASA: III  Anesthesia Plan:    Post-op Pain Management:    Induction: Rapid sequence, Cricoid pressure planned and Intravenous  Airway Management Planned: Oral ETT  Additional Equipment:   Intra-op Plan:   Post-operative Plan: Possible Post-op intubation/ventilation  Informed Consent:   Dental advisory given  Plan Discussed with: CRNA, Anesthesiologist and Surgeon  Anesthesia Plan Comments:         Anesthesia Quick Evaluation

## 2013-02-09 NOTE — Progress Notes (Signed)
Family daughter and granddaughter at bedside pt confused complaining of being claustrophobic. Much calmer with family at bedside

## 2013-02-10 ENCOUNTER — Encounter (HOSPITAL_COMMUNITY): Payer: Self-pay | Admitting: General Surgery

## 2013-02-10 DIAGNOSIS — R1013 Epigastric pain: Secondary | ICD-10-CM

## 2013-02-10 LAB — BASIC METABOLIC PANEL
CO2: 22 mEq/L (ref 19–32)
CO2: 24 mEq/L (ref 19–32)
Chloride: 113 mEq/L — ABNORMAL HIGH (ref 96–112)
Chloride: 115 mEq/L — ABNORMAL HIGH (ref 96–112)
GFR calc Af Amer: >90 mL/min (ref >90)
Glucose, Bld: 92 mg/dL (ref 70–99)
Potassium: 2.9 mEq/L — ABNORMAL LOW (ref 3.5–5.1)
Sodium: 150 mEq/L — ABNORMAL HIGH (ref 135–145)
Sodium: 152 mEq/L — ABNORMAL HIGH (ref 135–145)

## 2013-02-10 LAB — CBC
Hemoglobin: 13.6 g/dL (ref 12.0–15.0)
MCV: 86 fL (ref 78.0–100.0)
Platelets: 163 10*3/uL (ref 150–400)
RBC: 4.58 MIL/uL (ref 3.87–5.11)
WBC: 11.4 10*3/uL — ABNORMAL HIGH (ref 4.0–10.5)

## 2013-02-10 MED ORDER — ENOXAPARIN SODIUM 40 MG/0.4ML ~~LOC~~ SOLN
40.0000 mg | SUBCUTANEOUS | Status: DC
  Filled 2013-02-10: qty 0.4

## 2013-02-10 MED ORDER — METOPROLOL TARTRATE 1 MG/ML IV SOLN
2.5000 mg | Freq: Four times a day (QID) | INTRAVENOUS | Status: DC
  Administered 2013-02-10 – 2013-02-17 (×27): 2.5 mg via INTRAVENOUS
  Filled 2013-02-10 (×37): qty 5

## 2013-02-10 MED ORDER — WHITE PETROLATUM GEL
Status: AC
  Administered 2013-02-10: 0.2
  Filled 2013-02-10: qty 5

## 2013-02-10 MED ORDER — POTASSIUM CHLORIDE 10 MEQ/100ML IV SOLN
INTRAVENOUS | Status: AC
  Administered 2013-02-10: 10 meq via INTRAVENOUS
  Filled 2013-02-10: qty 400

## 2013-02-10 MED ORDER — ENOXAPARIN SODIUM 30 MG/0.3ML ~~LOC~~ SOLN
30.0000 mg | SUBCUTANEOUS | Status: DC
  Administered 2013-02-10 – 2013-02-17 (×8): 30 mg via SUBCUTANEOUS
  Filled 2013-02-10 (×9): qty 0.3

## 2013-02-10 MED ORDER — KCL IN DEXTROSE-NACL 20-5-0.45 MEQ/L-%-% IV SOLN
INTRAVENOUS | Status: DC
  Administered 2013-02-10 – 2013-02-16 (×8): via INTRAVENOUS
  Filled 2013-02-10 (×19): qty 1000

## 2013-02-10 MED ORDER — POTASSIUM CHLORIDE 10 MEQ/100ML IV SOLN
10.0000 meq | INTRAVENOUS | Status: AC
  Administered 2013-02-10 (×3): 10 meq via INTRAVENOUS

## 2013-02-10 MED ORDER — POTASSIUM CHLORIDE 10 MEQ/100ML IV SOLN
10.0000 meq | INTRAVENOUS | Status: AC
  Administered 2013-02-10 (×4): 10 meq via INTRAVENOUS
  Filled 2013-02-10: qty 400

## 2013-02-10 NOTE — Progress Notes (Signed)
Patient ID: Ashley Burnett  female  ZOX:[id number]    DOB: [birth year]    DOA: 02/04/2013  PCP: Loreen Freud, DO  Assessment/Plan: Principal Problem:   SBO (small bowel obstruction): POD#1 - s/p exploratory laparotomy with lysis of adhesions, small bowel resection with primary anastomosis - per [facility]  Active Problems:  HTN with tachycardia: likely sec tp pain and post-surgery - patient has not been on any antihypertensives out-patient - pain mgmt per [facility] - can place on BB low dose if needed  DVT Prophylaxis:  Code Status:  Disposition: d/w Dr Dwain Sarna, [facility] to take over    Subjective: Asking if she can gave water, doesnot remember the surgery  Objective: Weight change:   Intake/Output Summary (Last 24 hours) at 02/10/13 0753 Last data filed at 02/10/13 0700  Gross per 24 hour  Intake 3081.67 ml  Output   1350 ml  Net 1731.67 ml   Blood pressure 155/70, pulse 102, temperature 99.5 F (37.5 C), temperature source Axillary, resp. rate 19, height 5\' 4"  (1.626 m), weight 45 kg (99 lb 3.3 oz), SpO2 99.00%.  Physical Exam: General: Alert and awake, not in any acute distress, NGT. CVS: S1-S2 clear, no murmur rubs or gallops Chest: clear to auscultation bilaterally, no wheezing, rales or rhonchi Abdomen: dressing intact, distended Extremities: no cyanosis, clubbing or edema noted bilaterally   Lab Results: Basic Metabolic Panel:  Recent Labs Lab 02/09/13 0800 02/09/13 1254  NA 147* 148*  K 3.1* 2.5*  CL 109 111  CO2 23 22  GLUCOSE 166* 178*  BUN 13 12  CREATININE 0.36* 0.35*  CALCIUM 8.6 8.0*   Liver Function Tests:  Recent Labs Lab 02/04/13 1321  AST 22  ALT 14  ALKPHOS 66  BILITOT 0.6  PROT 7.6  ALBUMIN 4.2    Recent Labs Lab 02/04/13 1321  LIPASE 17   No results found for this basename: AMMONIA,  in the last 168 hours CBC:  Recent Labs Lab 02/04/13 1321  02/09/13 0800 02/10/13 0545  WBC 10.8*  < > 9.2 11.4*  NEUTROABS 9.7*  --   --   --    HGB 14.0  < > 15.0 13.6  HCT 39.8  < > 42.0 39.4  MCV 85.8  < > 84.3 86.0  PLT 266  < > 193 163  < > = values in this interval not displayed. Cardiac Enzymes:  Recent Labs Lab 02/04/13 1700  TROPONINI <0.30   BNP: No components found with this basename: POCBNP,  CBG: No results found for this basename: GLUCAP,  in the last 168 hours   Micro Results: Recent Results (from the past 240 hour(s))  URINE CULTURE     Status: None   Collection Time    02/04/13  4:28 PM      Result Value Range Status   Specimen Description URINE, CATHETERIZED   Final   Special Requests NONE   Final   Culture  Setup Time 02/04/2013 17:05   Final   Colony Count NO GROWTH   Final   Culture NO GROWTH   Final   Report Status 02/05/2013 FINAL   Final  MRSA PCR SCREENING     Status: None   Collection Time    02/09/13  2:52 PM      Result Value Range Status   MRSA by PCR NEGATIVE  NEGATIVE Final   Comment:            The GeneXpert MRSA Assay (FDA  approved for NASAL specimens     only), is one component of a     comprehensive MRSA colonization     surveillance program. It is not     intended to diagnose MRSA     infection nor to guide or     monitor treatment for     MRSA infections.    Studies/Results: Ct Abdomen Pelvis W Contrast  02/04/2013   *RADIOLOGY REPORT*  Clinical Data: Abdominal pain and emesis  CT ABDOMEN AND PELVIS WITH CONTRAST  Technique:  Multidetector CT imaging of the abdomen and pelvis was performed following the standard protocol during bolus administration of intravenous contrast.  Contrast: 80mL OMNIPAQUE IOHEXOL 300 MG/ML  SOLN  Comparison: 06/18/2005  Findings: Scar versus atelectasis is noted in the right lung base. No pleural or pericardial effusion.  There is no focal liver abnormalities identified.  The gallbladder appears normal.  No biliary dilatation.  The pancreas is unremarkable.  Normal appearance of the spleen.  The adrenal glands are both normal.  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  This is unchanged from previous exam.  The left kidney is negative.  There is gas within the lumen of the urinary bladder.  There is a Foley catheter present.  Calcified atherosclerotic disease affects the abdominal aorta. There is no aneurysm.  There is no upper abdominal adenopathy.  No pelvic or inguinal adenopathy noted.  No free fluid or fluid collections identified within the abdomen or pelvis.  Normal appearance of the stomach.  The proximal small bowel loops are abnormally dilated measuring up to 4 cm.  Small bowel fluid levels are also present.  The mid and distal small bowel loops are collapsed.  Transition point is identified within the left iliac fossa, image number 45/series 2.  Gas and stool noted throughout the colon.  There is extensive colonic diverticulosis without evidence for diverticulitis.  There is no free intraperitoneal air identified within the abdomen or pelvis.  No evidence for bowel perforation or abscess formation.  Review of the visualized osseous structures is significant for multilevel degenerative disc disease.  IMPRESSION:  1.  Examination is positive for proximal small bowel obstruction. Transition point is at the level of the mid small bowel in the left iliac fossa.  2.  No evidence for perforation or abscess formation. 3.  Lumbar degenerative disc disease.   Original Report Authenticated By: Signa Kell, M.D.   Dg Abd 2 Views  02/07/2013   *RADIOLOGY REPORT*  Clinical Data: Small bowel obstruction  ABDOMEN - 2 VIEW  Comparison: 02/04/2013  Findings: NG tube in place noted.  Distended small bowel loops are noted in the left abdomen with air-fluid levels consistent with small bowel obstruction.  IMPRESSION: Distended small bowel loops in left hand with air-fluid levels consistent with small bowel obstruction.  NG tube in place.   Original  Report Authenticated By: Natasha Mead, M.D.   Dg Abd Acute W/chest  02/04/2013   *RADIOLOGY REPORT*  Clinical Data: Abdominal pain  ACUTE ABDOMEN SERIES (ABDOMEN 2 VIEW & CHEST 1 VIEW)  Comparison: 08/14/2009  Findings: Heart size appears normal.  There is no pleural effusion or edema.  The lungs are hyperinflated and there is coarsened interstitial markings noted bilaterally.  Nasogastric tube is noted with side port below GE junction.  There is a moderate stool burden identified throughout the colon.  Dilated loop of small bowel  is noted in the left lower quadrant measuring up to 4.3 cm.  On the lateral decubitus radiograph there are multiple small bowel fluid levels.  No free intraperitoneal air is identified.  IMPRESSION:  1.  Dilated small bowel loops and fluid levels.  Cannot rule out to small bowel obstruction. 2.  No acute cardiopulmonary abnormalities.   Original Report Authenticated By: Signa Kell, M.D.    Medications: Scheduled Meds: . pantoprazole (PROTONIX) IV  40 mg Intravenous Q12H      LOS: 6 days   RAI,RIPUDEEP M.D. [facility] 02/10/2013, 7:53 AM Pager: [phone]  If 7PM-7AM, please contact night-coverage [email or web address] Password [email or web address]

## 2013-02-10 NOTE — Progress Notes (Signed)
1 Day Post-Op  Subjective: Feels ok, appears at her baseline  Objective: Vital signs in last 24 hours: Temp:  [97.7 F (36.5 C)-99.5 F (37.5 C)] 99.5 F (37.5 C) (05/22 0730) Pulse Rate:  [74-109] 102 (05/22 0730) Resp:  [9-23] 19 (05/22 0730) BP: (115-196)/(57-87) 155/70 mmHg (05/22 0730) SpO2:  [98 %-100 %] 99 % (05/22 0730) Weight:  [99 lb 3.3 oz (45 kg)] 99 lb 3.3 oz (45 kg) (05/21 1428) Last BM Date: 02/01/13  Intake/Output from previous day: 05/21 0701 - 05/22 0700 In: 3081.7 [I.V.:2121.7; NG/GT:60; IV Piggyback:900] Out: 1350 [Urine:1000; Emesis/NG output:200; Blood:150] Intake/Output this shift:    General appearance: no distress Resp: diminished breath sounds bibasilar Cardio: regular rate and rhythm GI: abnormal findings:  soft, wound dressed, no bs, approp tender  Lab Results:   Recent Labs  02/09/13 0800 02/10/13 0545  WBC 9.2 11.4*  HGB 15.0 13.6  HCT 42.0 39.4  PLT 193 163   BMET  Recent Labs  02/09/13 1254 02/10/13 0545  NA 148* 150*  K 2.5* 2.3*  CL 111 113*  CO2 22 22  GLUCOSE 178* 92  BUN 12 18  CREATININE 0.35* 0.42*  CALCIUM 8.0* 7.7*    Assessment/Plan: POD 1 sbr/elap with loa 1. Continue prn pain meds 2. pulm toilet, pt to see, oob today 3. Await ileus resolve, ng, npo 4. picc line and consider tna tomorrow if does not start improving quickly 5. Lovenox, scds 6. Replace k, recheck later today 7. Continue foley for monitoring  [facility] 02/10/2013

## 2013-02-10 NOTE — Evaluation (Signed)
Physical Therapy Evaluation Patient Details Name: Ashley Burnett MRN: [id number] DOB: 1918-06-18 Today's Date: 02/10/2013 Time: 7829-5621 PT Time Calculation (min): 40 min  PT Assessment / Plan / Recommendation Clinical Impression    Pt admitted with SBO and underwent lysis of adhesions and resection. Pt currently with functional limitations due to the deficits listed below (PT Problem List). Pt will benefit from skilled PT to increase their independence and safety with mobility to allow discharge to ST-SNF with eventual return home.      PT Assessment  Patient needs continued PT services    Follow Up Recommendations  SNF    Does the patient have the potential to tolerate intense rehabilitation      Barriers to Discharge        Equipment Recommendations  Rolling walker with 5" wheels    Recommendations for Other Services     Frequency Min 3X/week    Precautions / Restrictions Precautions Precautions: Fall   Pertinent Vitals/Pain Pain all over including ribs and shoulders. Repositioned.      Mobility  Bed Mobility Bed Mobility: Sit to Supine;Scooting to HOB Sit to Supine: HOB elevated;4: Min assist Scooting to HOB: 1: +2 Total assist Scooting to [facility]: Patient Percentage: 0% Details for Bed Mobility Assistance: Assist to bring legs up Transfers Transfers: Sit to Stand;Stand to Sit Sit to Stand: 4: Min assist;With upper extremity assist;With armrests;From chair/3-in-1 Stand to Sit: 4: Min assist;With upper extremity assist;To bed Details for Transfer Assistance: Verbal cues for hand placement. Ambulation/Gait Ambulation/Gait Assistance: 4: Min assist Ambulation Distance (Feet): 90 Feet Ambulation/Gait Assistance Details: Verbal cues to incr step length. Gait Pattern: Step-through pattern;Decreased step length - right;Decreased step length - left;Shuffle;Narrow base of support Gait velocity: very slow speed.    Exercises     PT Diagnosis: Difficulty  walking;Generalized weakness;Acute pain  PT Problem List: Decreased strength;Decreased activity tolerance;Decreased balance;Decreased knowledge of use of DME;Decreased mobility;Pain PT Treatment Interventions: DME instruction;Gait training;Patient/family education;Functional mobility training;Therapeutic activities;Therapeutic exercise;Balance training   PT Goals Acute Rehab PT Goals PT Goal Formulation: With patient Time For Goal Achievement: 02/17/13 Potential to Achieve Goals: Good Pt will go Supine/Side to Sit: with supervision PT Goal: Supine/Side to Sit - Progress: Goal set today Pt will go Sit to Supine/Side: with supervision PT Goal: Sit to Supine/Side - Progress: Goal set today Pt will go Sit to Stand: with supervision PT Goal: Sit to Stand - Progress: Goal set today Pt will go Stand to Sit: with supervision PT Goal: Stand to Sit - Progress: Goal set today Pt will Ambulate: 51 - 150 feet;with supervision;with least restrictive assistive device PT Goal: Ambulate - Progress: Goal set today  Visit Information  Last PT Received On: 02/10/13 Assistance Needed: +1    Subjective Data  Subjective: "Can I have a Coke?" Patient Stated Goal: Return home   Prior Functioning  Home Living Lives With: Alone Available Help at Discharge: Family;Available PRN/intermittently Type of Home: House Home Access: Stairs to enter [employer] of Steps: 2-3 Entrance Stairs-Rails: Right Home Layout: One level Prior Function Level of Independence: Independent Able to Take Stairs?: Yes Driving: Yes Vocation: Retired Musician: No difficulties    Copywriter, advertising Arousal/Alertness: Awake/alert Behavior During Therapy: WFL for tasks assessed/performed Overall Cognitive Status: Within Functional Limits for tasks assessed    Extremity/Trunk Assessment Right Lower Extremity Assessment RLE ROM/Strength/Tone: Deficits RLE ROM/Strength/Tone Deficits: grossly  4/5 Left Lower Extremity Assessment LLE ROM/Strength/Tone: Deficits LLE ROM/Strength/Tone Deficits: grossly 4/5   Balance Balance Balance  Assessed: Yes Static Standing Balance Static Standing - Balance Support: Bilateral upper extremity supported Static Standing - Level of Assistance: 4: Min assist  End of Session PT - End of Session Activity Tolerance: Patient limited by fatigue Patient left: in bed;with call bell/phone within reach;with family/visitor present Nurse Communication: Mobility status  GP     Jaken Fregia 02/10/2013, 2:15 PM  [facility] PT [phone]

## 2013-02-11 DIAGNOSIS — I10 Essential (primary) hypertension: Secondary | ICD-10-CM

## 2013-02-11 LAB — CBC
HCT: 36.8 % (ref 36.0–46.0)
Hemoglobin: 12.5 g/dL (ref 12.0–15.0)
RBC: 4.24 MIL/uL (ref 3.87–5.11)
RDW: 14.9 % (ref 11.5–15.5)
WBC: 11 10*3/uL — ABNORMAL HIGH (ref 4.0–10.5)

## 2013-02-11 LAB — BASIC METABOLIC PANEL
BUN: 16 mg/dL (ref 6–23)
CO2: 28 mEq/L (ref 19–32)
Chloride: 110 mEq/L (ref 96–112)
GFR calc Af Amer: >90 mL/min (ref >90)
Potassium: 3.3 mEq/L — ABNORMAL LOW (ref 3.5–5.1)

## 2013-02-11 MED ORDER — POTASSIUM CHLORIDE 10 MEQ/100ML IV SOLN
10.0000 meq | INTRAVENOUS | Status: AC
  Administered 2013-02-11 (×3): 10 meq via INTRAVENOUS
  Filled 2013-02-11: qty 300

## 2013-02-11 NOTE — Progress Notes (Signed)
Patient ID: Ashley Burnett  female  VWU:[id number]    DOB: 05-Feb-1918    DOA: 02/04/2013  PCP: Loreen Freud, DO  Assessment/Plan: Principal Problem:   SBO (small bowel obstruction): POD#2 - s/p exploratory laparotomy with lysis of adhesions, small bowel resection with primary anastomosis - per [facility]  Active Problems:  Elevated BP/HTN with tachycardia: likely sec tp pain and post-surgery - Hr has improved, currently NSR in 90's on tele - patient has not been on any antihypertensives out-patient, per her report no prior history of HTN - pain mgmt per [facility] - I placed her on BB low dose for now, can change to atenolol 25mg  daily once she is tolerating oral diet and acute issues are over (she may not need it).     HypoKalemia: replaced IV  DVT Prophylaxis:  Code Status:  Disposition: d/w Dr Dwain Sarna, [facility] to take over. I will sign off.      Subjective: Wants ice, NGT still in.   Objective: Weight change:   Intake/Output Summary (Last 24 hours) at 02/11/13 0737 Last data filed at 02/11/13 0400  Gross per 24 hour  Intake 1363.75 ml  Output   1025 ml  Net 338.75 ml   Blood pressure 169/77, pulse 100, temperature 97.5 F (36.4 C), temperature source Oral, resp. rate 19, height 5\' 4"  (1.626 m), weight 45 kg (99 lb 3.3 oz), SpO2 95.00%.  Physical Exam: General: Alert and awake,NAD, NGT. CVS: S1-S2 clear, no murmur rubs or gallops Chest: CTAB Abdomen: dressing intact, distended, soft Extremities: no c/c/e bilaterally   Lab Results: Basic Metabolic Panel:  Recent Labs Lab 02/10/13 1012 02/11/13 0519  NA 152* 144  K 2.9* 3.3*  CL 115* 110  CO2 24 28  GLUCOSE 93 147*  BUN 17 16  CREATININE 0.48* 0.41*  CALCIUM 7.8* 8.4   Liver Function Tests:  Recent Labs Lab 02/04/13 1321  AST 22  ALT 14  ALKPHOS 66  BILITOT 0.6  PROT 7.6  ALBUMIN 4.2    Recent Labs Lab 02/04/13 1321  LIPASE 17   No results found for this basename: AMMONIA,  in the last 168  hours CBC:  Recent Labs Lab 02/04/13 1321  02/10/13 0545 02/11/13 0519  WBC 10.8*  < > 11.4* 11.0*  NEUTROABS 9.7*  --   --   --   HGB 14.0  < > 13.6 12.5  HCT 39.8  < > 39.4 36.8  MCV 85.8  < > 86.0 86.8  PLT 266  < > 163 160  < > = values in this interval not displayed. Cardiac Enzymes:  Recent Labs Lab 02/04/13 1700  TROPONINI <0.30   BNP: No components found with this basename: POCBNP,  CBG: No results found for this basename: GLUCAP,  in the last 168 hours   Micro Results: Recent Results (from the past 240 hour(s))  URINE CULTURE     Status: None   Collection Time    02/04/13  4:28 PM      Result Value Range Status   Specimen Description URINE, CATHETERIZED   Final   Special Requests NONE   Final   Culture  Setup Time 02/04/2013 17:05   Final   Colony Count NO GROWTH   Final   Culture NO GROWTH   Final   Report Status 02/05/2013 FINAL   Final  MRSA PCR SCREENING     Status: None   Collection Time    02/09/13  2:52 PM      Result  Value Range Status   MRSA by PCR NEGATIVE  NEGATIVE Final   Comment:            The GeneXpert MRSA Assay (FDA     approved for NASAL specimens     only), is one component of a     comprehensive MRSA colonization     surveillance program. It is not     intended to diagnose MRSA     infection nor to guide or     monitor treatment for     MRSA infections.    Studies/Results: Ct Abdomen Pelvis W Contrast  02/04/2013   *RADIOLOGY REPORT*  Clinical Data: Abdominal pain and emesis  CT ABDOMEN AND PELVIS WITH CONTRAST  Technique:  Multidetector CT imaging of the abdomen and pelvis was performed following the standard protocol during bolus administration of intravenous contrast.  Contrast: 80mL OMNIPAQUE IOHEXOL 300 MG/ML  SOLN  Comparison: 06/18/2005  Findings: Scar versus atelectasis is noted in the right lung base. No pleural or pericardial effusion.  There is no focal liver abnormalities identified.  The gallbladder appears normal.   No biliary dilatation.  The pancreas is unremarkable.  Normal appearance of the spleen.  The adrenal glands are both normal.  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  This is unchanged from previous exam.  The left kidney is negative.  There is gas within the lumen of the urinary bladder.  There is a Foley catheter present.  Calcified atherosclerotic disease affects the abdominal aorta. There is no aneurysm.  There is no upper abdominal adenopathy.  No pelvic or inguinal adenopathy noted.  No free fluid or fluid collections identified within the abdomen or pelvis.  Normal appearance of the stomach.  The proximal small bowel loops are abnormally dilated measuring up to 4 cm.  Small bowel fluid levels are also present.  The mid and distal small bowel loops are collapsed.  Transition point is identified within the left iliac fossa, image number 45/series 2.  Gas and stool noted throughout the colon.  There is extensive colonic diverticulosis without evidence for diverticulitis.  There is no free intraperitoneal air identified within the abdomen or pelvis.  No evidence for bowel perforation or abscess formation.  Review of the visualized osseous structures is significant for multilevel degenerative disc disease.  IMPRESSION:  1.  Examination is positive for proximal small bowel obstruction. Transition point is at the level of the mid small bowel in the left iliac fossa.  2.  No evidence for perforation or abscess formation. 3.  Lumbar degenerative disc disease.   Original Report Authenticated By: Signa Kell, M.D.   Dg Abd 2 Views  02/07/2013   *RADIOLOGY REPORT*  Clinical Data: Small bowel obstruction  ABDOMEN - 2 VIEW  Comparison: 02/04/2013  Findings: NG tube in place noted.  Distended small bowel loops are noted in the left abdomen with air-fluid levels consistent with small bowel obstruction.  IMPRESSION:  Distended small bowel loops in left hand with air-fluid levels consistent with small bowel obstruction.  NG tube in place.   Original Report Authenticated By: Natasha Mead, M.D.   Dg Abd Acute W/chest  02/04/2013   *RADIOLOGY REPORT*  Clinical Data: Abdominal pain  ACUTE ABDOMEN SERIES (ABDOMEN 2 VIEW & CHEST 1 VIEW)  Comparison: 08/14/2009  Findings: Heart size appears normal.  There is no pleural effusion or edema.  The lungs are hyperinflated and  there is coarsened interstitial markings noted bilaterally.  Nasogastric tube is noted with side port below GE junction.  There is a moderate stool burden identified throughout the colon.  Dilated loop of small bowel is noted in the left lower quadrant measuring up to 4.3 cm.  On the lateral decubitus radiograph there are multiple small bowel fluid levels.  No free intraperitoneal air is identified.  IMPRESSION:  1.  Dilated small bowel loops and fluid levels.  Cannot rule out to small bowel obstruction. 2.  No acute cardiopulmonary abnormalities.   Original Report Authenticated By: Signa Kell, M.D.    Medications: Scheduled Meds: . enoxaparin (LOVENOX) injection  30 mg Subcutaneous Q24H  . metoprolol  2.5 mg Intravenous Q6H  . pantoprazole (PROTONIX) IV  40 mg Intravenous Q12H      LOS: 7 days   Jameyah Fennewald M.D. [facility] 02/11/2013, 7:37 AM Pager: [phone]  If 7PM-7AM, please contact night-coverage [email or web address] Password [email or web address]

## 2013-02-11 NOTE — Progress Notes (Signed)
Physical Therapy Treatment Patient Details Name: Ashley Burnett MRN: [id number] DOB: 09-30-1917 Today's Date: 02/11/2013 Time: 4098-1191 PT Time Calculation (min): 25 min  PT Assessment / Plan / Recommendation Comments on Treatment Session  Pt s/p bowel resection and lysis of adhesions due to SBO.  Pt making steady progress and very motivated.    Follow Up Recommendations  SNF     Does the patient have the potential to tolerate intense rehabilitation     Barriers to Discharge        Equipment Recommendations  Rolling walker with 5" wheels    Recommendations for Other Services    Frequency Min 3X/week   Plan Discharge plan remains appropriate;Frequency remains appropriate    Precautions / Restrictions Precautions Precautions: Fall   Pertinent Vitals/Pain No c/o's with mobility.    Mobility  Bed Mobility Bed Mobility: Supine to Sit;Sitting - Scoot to Edge of Bed Supine to Sit: 3: Mod assist;HOB elevated Details for Bed Mobility Assistance: Assist to bring trunk up Transfers Sit to Stand: 3: Mod assist;With upper extremity assist;From bed Stand to Sit: 4: Min guard;With upper extremity assist;With armrests;To chair/3-in-1 Details for Transfer Assistance: Verbal cues for hand placement. Ambulation/Gait Ambulation/Gait Assistance: 4: Min assist Ambulation Distance (Feet): 150 Feet Assistive device: Rolling walker Ambulation/Gait Assistance Details: verbal cues to stand more erect and look up Gait Pattern: Step-through pattern;Decreased step length - right;Decreased step length - left;Shuffle;Trunk flexed;Narrow base of support Gait velocity: slow but faster than yesterday.    Exercises     PT Diagnosis:    PT Problem List:   PT Treatment Interventions:     PT Goals Acute Rehab PT Goals PT Goal: Supine/Side to Sit - Progress: Progressing toward goal PT Goal: Sit to Stand - Progress: Progressing toward goal PT Goal: Stand to Sit - Progress: Progressing toward goal PT  Goal: Ambulate - Progress: Progressing toward goal  Visit Information  Last PT Received On: 02/11/13 Assistance Needed: +1    Subjective Data  Subjective: Pt states she wants to get out of here.   Cognition  Cognition Arousal/Alertness: Awake/alert Behavior During Therapy: WFL for tasks assessed/performed Overall Cognitive Status: Impaired/Different from baseline Area of Impairment: Memory Memory: Decreased short-term memory General Comments: Pt reports she doesn't remember walking yesterday.    Balance  Static Standing Balance Static Standing - Balance Support: Bilateral upper extremity supported Static Standing - Level of Assistance: 4: Min assist  End of Session PT - End of Session Activity Tolerance: Patient tolerated treatment well Patient left: in chair;with call bell/phone within reach;with nursing in room Nurse Communication: Mobility status   GP     [facility] 02/11/2013, 9:53 AM  [facility] PT [phone]

## 2013-02-11 NOTE — Progress Notes (Signed)
NUTRITION FOLLOW UP  Pt qualifies for moderate MALNUTRITION of acute illness  Intervention:   1.  Modify diet; as medically appropriate to Regular goal based on GI function.  Consider early enteral trickle feeds via NGT with Vital 1.2 to promote gut motility. Pt has been NPO x7 days. 2.  GOC; for appropriate nutrition-related interventions  Nutrition Dx:   Inadequate oral intake, ongoing.  Monitor:   1.  Food/Beverage; resume of PO diet once appropriate.  Not met, ongoing.  2.  Nutrition support; monitor for need in prolonged NPO status.   Assessment:   Pt admitted with SBO. POD #2 for SBO. Pt remains NPO with NGT.  Passing some flatus, but no BM.  Pt continues with ice chips although NGT remains to suction.  Pt reports somewhat weary of resuming diet- "I just don't want to have any more trouble."  RD discussion slow progression with close monitoring of tolerance.  Pt with 2 lbs of wt loss since admission.  Pt now qualifies for moderate malnutrition of acute illness based on 2% wt loss in <1 week, poor intake meeting <75% of estimated needs.   Height: Ht Readings from Last 1 Encounters:  02/09/13 5\' 4"  (1.626 m)    Weight Status:   Wt Readings from Last 1 Encounters:  02/09/13 99 lb 3.3 oz (45 kg)    Re-estimated needs:  Kcal: 1150-1260 Protein: 46-55g Fluid: >1.2 L/day  Skin: intact  Diet Order: NPO   Intake/Output Summary (Last 24 hours) at 02/11/13 0911 Last data filed at 02/11/13 0807  Gross per 24 hour  Intake 1363.75 ml  Output   1150 ml  Net 213.75 ml    Last BM: PTA   Labs:   Recent Labs Lab 02/10/13 0545 02/10/13 1012 02/11/13 0519  NA 150* 152* 144  K 2.3* 2.9* 3.3*  CL 113* 115* 110  CO2 22 24 28   BUN 18 17 16   CREATININE 0.42* 0.48* 0.41*  CALCIUM 7.7* 7.8* 8.4  GLUCOSE 92 93 147*    CBG (last 3)  No results found for this basename: GLUCAP,  in the last 72 hours  Scheduled Meds: . enoxaparin (LOVENOX) injection  30 mg Subcutaneous  Q24H  . metoprolol  2.5 mg Intravenous Q6H  . pantoprazole (PROTONIX) IV  40 mg Intravenous Q12H  . potassium chloride  10 mEq Intravenous Q1 Hr x 3    Continuous Infusions: . dextrose 5 % and 0.45 % NaCl with KCl 20 mEq/L 75 mL/hr at 02/11/13 0600   Loyce Dys, MS RD LDN Clinical Inpatient Dietitian Pager: [phone] Weekend/After hours pager: [phone]

## 2013-02-11 NOTE — Progress Notes (Signed)
2 Days Post-Op  Subjective: No flatus yet, pain controlled, feels ok but tired  Objective: Vital signs in last 24 hours: Temp:  [97.4 F (36.3 C)-99.3 F (37.4 C)] 99.3 F (37.4 C) (05/23 0807) Pulse Rate:  [95-118] 100 (05/23 0400) Resp:  [12-25] 19 (05/23 0400) BP: (137-169)/(66-77) 169/77 mmHg (05/23 0400) SpO2:  [94 %-95 %] 95 % (05/23 0400) Last BM Date: 02/01/13  Intake/Output from previous day: 05/22 0701 - 05/23 0700 In: 1363.8 [I.V.:563.8; IV Piggyback:800] Out: 1025 [Urine:825; Emesis/NG output:200] Intake/Output this shift: Total I/O In: -  Out: 125 [Urine:125]  General appearance: no distress Resp: clear to auscultation bilaterally Cardio: regular rate and rhythm GI: soft, approp tender, some bs, wound clean  Lab Results:   Recent Labs  02/10/13 0545 02/11/13 0519  WBC 11.4* 11.0*  HGB 13.6 12.5  HCT 39.4 36.8  PLT 163 160   BMET  Recent Labs  02/10/13 1012 02/11/13 0519  NA 152* 144  K 2.9* 3.3*  CL 115* 110  CO2 24 28  GLUCOSE 93 147*  BUN 17 16  CREATININE 0.48* 0.41*  CALCIUM 7.8* 8.4   PT/INR No results found for this basename: LABPROT, INR,  in the last 72 hours ABG No results found for this basename: PHART, PCO2, PO2, HCO3,  in the last 72 hours  Studies/Results: No results found.  Anti-infectives: Anti-infectives   Start     Dose/Rate Route Frequency Ordered Stop   02/09/13 1037  dextrose 5 % with cefOXitin (MEFOXIN) ADS Med    Comments:  MCMILLEN, MIKE: cabinet override      02/09/13 1037 02/09/13 2244      Assessment/Plan: POD 2 elap/sbr 1. Baseline neuro status, cont pain meds 2. pulm toilet, oob 3. Cont ng I think ileus resolving 4. Cont foley today for monitoring and due to inability to get up, will dc tomorrow 5. Replace k, recheck am 6. Lovenox, scds  [facility] 02/11/2013

## 2013-02-12 LAB — BASIC METABOLIC PANEL
CO2: 28 mEq/L (ref 19–32)
Chloride: 106 mEq/L (ref 96–112)
Creatinine, Ser: 0.41 mg/dL — ABNORMAL LOW (ref 0.50–1.10)
Glucose, Bld: 134 mg/dL — ABNORMAL HIGH (ref 70–99)

## 2013-02-12 MED ORDER — POTASSIUM CHLORIDE 10 MEQ/100ML IV SOLN
10.0000 meq | INTRAVENOUS | Status: AC
  Administered 2013-02-12 (×4): 10 meq via INTRAVENOUS
  Filled 2013-02-12: qty 400

## 2013-02-12 NOTE — Progress Notes (Signed)
3 Days Post-Op  Subjective: States she had flatus, no n/v, doesn't remember she had surgery  Objective: Vital signs in last 24 hours: Temp:  [98.2 F (36.8 C)-99.4 F (37.4 C)] 98.3 F (36.8 C) (05/24 0720) Pulse Rate:  [81-100] 86 (05/24 0720) Resp:  [16-24] 20 (05/24 0720) BP: (136-182)/(66-79) 176/75 mmHg (05/24 0720) SpO2:  [94 %-98 %] 96 % (05/24 0720) Last BM Date: 02/01/13  Intake/Output from previous day: 05/23 0701 - 05/24 0700 In: 1500 [I.V.:1200; IV Piggyback:300] Out: 1550 [Urine:1400; Emesis/NG output:150] Intake/Output this shift: Total I/O In: 675 [I.V.:675] Out: -   General appearance: no distress Resp: clear to auscultation bilaterally Cardio: regular rate and rhythm GI: wound clean, bs present, soft, approp tender  Lab Results:   Recent Labs  02/10/13 0545 02/11/13 0519  WBC 11.4* 11.0*  HGB 13.6 12.5  HCT 39.4 36.8  PLT 163 160   BMET  Recent Labs  02/11/13 0519 02/12/13 0555  NA 144 141  K 3.3* 3.3*  CL 110 106  CO2 28 28  GLUCOSE 147* 134*  BUN 16 10  CREATININE 0.41* 0.41*  CALCIUM 8.4 8.1*   PT/INR No results found for this basename: LABPROT, INR,  in the last 72 hours ABG No results found for this basename: PHART, PCO2, PO2, HCO3,  in the last 72 hours  Studies/Results: No results found.  Anti-infectives: Anti-infectives   Start     Dose/Rate Route Frequency Ordered Stop   02/09/13 1037  dextrose 5 % with cefOXitin (MEFOXIN) ADS Med    Comments:  MCMILLEN, MIKE: cabinet override      02/09/13 1037 02/09/13 2244      Assessment/Plan: POD 3 elap/sbr/loa 1. Cont prn pain meds 2. pulm toilet, oob 3. Dc foley, replete k 4. Appears ileus resolving, dc ng, keep npo for today 5. Lovenox, scds 6. tx to floor tomorrow if does ok today  [facility] 02/12/2013

## 2013-02-13 LAB — BASIC METABOLIC PANEL
CO2: 26 mEq/L (ref 19–32)
Calcium: 8 mg/dL — ABNORMAL LOW (ref 8.4–10.5)
Glucose, Bld: 126 mg/dL — ABNORMAL HIGH (ref 70–99)
Potassium: 3.5 mEq/L (ref 3.5–5.1)
Sodium: 137 mEq/L (ref 135–145)

## 2013-02-13 NOTE — Progress Notes (Signed)
Report called pt going to [id number] via w/c with belongings, notified daugther of new room. Beryle Quant

## 2013-02-13 NOTE — Progress Notes (Signed)
I have seen and examined the pt and agree with PA-Osborne's progress note.  

## 2013-02-13 NOTE — Progress Notes (Signed)
Patient ID: Ashley Burnett, female   DOB: August 01, 1918, 79 y.o.   MRN: [id number] 4 Days Post-Op  Subjective: Pt feels well.  Minimal abdominal pain.  Passing some flatus.  Objective: Vital signs in last 24 hours: Temp:  [97.8 F (36.6 C)-98.7 F (37.1 C)] 98.7 F (37.1 C) (05/25 0715) Pulse Rate:  [79-94] 79 (05/25 0715) Resp:  [11-24] 15 (05/25 0715) BP: (156-177)/(68-84) 157/71 mmHg (05/25 0715) SpO2:  [94 %-97 %] 97 % (05/25 0715) Last BM Date: 02/01/13  Intake/Output from previous day: 05/24 0701 - 05/25 0700 In: 2800 [I.V.:2400; IV Piggyback:400] Out: 2025 [Urine:2025] Intake/Output this shift: Total I/O In: 75 [I.V.:75] Out: 450 [Urine:450]  PE: Abd: soft, minimally tender, wound is clean and packed.  +BS, minimal distention  Lab Results:   Recent Labs  02/11/13 0519  WBC 11.0*  HGB 12.5  HCT 36.8  PLT 160   BMET  Recent Labs  02/12/13 0555 02/13/13 0400  NA 141 137  K 3.3* 3.5  CL 106 104  CO2 28 26  GLUCOSE 134* 126*  BUN 10 6  CREATININE 0.41* 0.33*  CALCIUM 8.1* 8.0*   PT/INR No results found for this basename: LABPROT, INR,  in the last 72 hours CMP     Component Value Date/Time   NA 137 02/13/2013 0400   K 3.5 02/13/2013 0400   CL 104 02/13/2013 0400   CO2 26 02/13/2013 0400   GLUCOSE 126* 02/13/2013 0400   GLUCOSE 104* 08/06/2006 1001   BUN 6 02/13/2013 0400   CREATININE 0.33* 02/13/2013 0400   CALCIUM 8.0* 02/13/2013 0400   PROT 7.6 02/04/2013 1321   ALBUMIN 4.2 02/04/2013 1321   AST 22 02/04/2013 1321   ALT 14 02/04/2013 1321   ALKPHOS 66 02/04/2013 1321   BILITOT 0.6 02/04/2013 1321   GFRNONAA >90 02/13/2013 0400   GFRAA >90 02/13/2013 0400   Lipase     Component Value Date/Time   LIPASE 17 02/04/2013 1321       Studies/Results: No results found.  Anti-infectives: Anti-infectives   Start     Dose/Rate Route Frequency Ordered Stop   02/09/13 1037  dextrose 5 % with cefOXitin (MEFOXIN) ADS Med    Comments:  MCMILLEN, MIKE: cabinet  override      02/09/13 1037 02/09/13 2244       Assessment/Plan  1. S/p ex lap with LOA 2. Post op ileus, resolving 3. Dementia 4. Deconditioning  Plan: 1. Advance to clear liquids 2. Follow K, check BMET in am 3. Transfer to floor 4. SW consult for SNF placement   LOS: 9 days    Mechele Kittleson E 02/13/2013, 9:29 AM Pager: [phone]

## 2013-02-14 LAB — BASIC METABOLIC PANEL
BUN: 5 mg/dL — ABNORMAL LOW (ref 6–23)
Chloride: 103 mEq/L (ref 96–112)
Glucose, Bld: 137 mg/dL — ABNORMAL HIGH (ref 70–99)
Potassium: 3 mEq/L — ABNORMAL LOW (ref 3.5–5.1)

## 2013-02-14 MED ORDER — POTASSIUM CHLORIDE 10 MEQ/100ML IV SOLN
10.0000 meq | INTRAVENOUS | Status: AC
  Administered 2013-02-14 (×3): 10 meq via INTRAVENOUS
  Filled 2013-02-14 (×3): qty 100

## 2013-02-14 NOTE — Progress Notes (Signed)
5 Days Post-Op  Subjective: No complaints wants to sit in her yard.  Objective: Vital signs in last 24 hours: Temp:  [97.6 F (36.4 C)-98.3 F (36.8 C)] 98.3 F (36.8 C) (05/26 0527) Pulse Rate:  [77-86] 85 (05/26 0527) Resp:  [16-18] 18 (05/26 0527) BP: (128-159)/(65-72) 130/70 mmHg (05/26 0527) SpO2:  [95 %-98 %] 97 % (05/26 0527) Last BM Date: 02/13/13  Intake/Output from previous day: 05/25 0701 - 05/26 0700 In: 736.3 [P.O.:180; I.V.:556.3] Out: 900 [Urine:900] Intake/Output this shift: Total I/O In: -  Out: 300 [Urine:300]  Incision/Wound:open and clean  Soft non tender  Lab Results:  No results found for this basename: WBC, HGB, HCT, PLT,  in the last 72 hours BMET  Recent Labs  02/13/13 0400 02/14/13 0350  NA 137 137  K 3.5 3.0*  CL 104 103  CO2 26 26  GLUCOSE 126* 137*  BUN 6 5*  CREATININE 0.33* 0.36*  CALCIUM 8.0* 7.9*   PT/INR No results found for this basename: LABPROT, INR,  in the last 72 hours ABG No results found for this basename: PHART, PCO2, PO2, HCO3,  in the last 72 hours  Studies/Results: No results found.  Anti-infectives: Anti-infectives   Start     Dose/Rate Route Frequency Ordered Stop   02/09/13 1037  dextrose 5 % with cefOXitin (MEFOXIN) ADS Med    Comments:  MCMILLEN, MIKE: cabinet override      02/09/13 1037 02/09/13 2244      Assessment/Plan  1. S/p ex lap with LOA  2. Post op ileus, resolving  3. Dementia  4. Deconditioning  Plan:  1. Advance to full  liquids  2. Follow K, check BMET in am  3. Transfer to floor  4. SW consult for SNF placement    LOS: 10 days    Hadden Steig A. 02/14/2013

## 2013-02-14 NOTE — Clinical Social Work Placement (Addendum)
    Clinical Social Work Department CLINICAL SOCIAL WORK PLACEMENT NOTE 02/14/2013  Patient:  Ashley Burnett, Ashley Burnett  Account Number:  [id number] Admit date:  02/04/2013  Clinical Social Worker:  Hulan Fray  Date/time:  02/14/2013 10:44 AM  Clinical Social Work is seeking post-discharge placement for this patient at the following level of care:   SKILLED NURSING   (*CSW will update this form in Epic as items are completed)   02/14/2013  Patient/family provided with [facility] Department of Clinical Social Work's list of facilities offering this level of care within the geographic area requested by the patient (or if unable, by the patient's family).  02/14/2013  Patient/family informed of their freedom to choose among providers that offer the needed level of care, that participate in Medicare, Medicaid or managed care program needed by the patient, have an available bed and are willing to accept the patient.  02/14/2013  Patient/family informed of [facility]' ownership interest in [facility], as well as of the fact that they are under no obligation to receive care at this facility.  PASARR submitted to EDS on 02/14/2013 PASARR number received from EDS on 02/14/2013  FL2 transmitted to all facilities in geographic area requested by pt/family on  02/14/2013 FL2 transmitted to all facilities within larger geographic area on   Patient informed that his/her managed care company has contracts with or will negotiate with  certain facilities, including the following:     Patient/family informed of bed offers received:  02-15-13 Patient chooses bed at [facility] Physician recommends and patient chooses bed at    Patient to be transferred to [facility] on 02-17-13   [facility]  The following physician request were entered in Epic:   Additional Comments:

## 2013-02-14 NOTE — Clinical Social Work Psychosocial (Signed)
     Clinical Social Work Department BRIEF PSYCHOSOCIAL ASSESSMENT 02/14/2013  Patient:  Ashley Burnett, Ashley Burnett     Account Number:  [id number]     Admit date:  02/04/2013  Clinical Social Worker:  Hulan Fray  Date/Time:  02/14/2013 10:20 AM  Referred by:  Physician  Date Referred:  02/13/2013 Referred for  SNF Placement   Other Referral:   Interview type:  Patient Other interview type:    PSYCHOSOCIAL DATA Living Status:  ALONE Admitted from facility:   Level of care:   Primary support name:  Jenniferlynn Saad ([phone], [phone]) Primary support relationship to patient:  CHILD, ADULT Degree of support available:   supportive    CURRENT CONCERNS Current Concerns  Post-Acute Placement   Other Concerns:    SOCIAL WORK ASSESSMENT / PLAN Clinical Social Worker received referral for patient needing short term SNF placement at discharge. CSW introduced self and explained reason for visit. CSW provided SNF list and explained SNF process. Patient is agreeable for CSW to initiate referral within [facility]. Assessment was cut short as patient needed the nurse for assistance to bathroom.    Patient's insurance will require PT/OT notes for prior approval for SNF. CSW will iniitiate process for insurance authorization.    CSW will complete FL2 for MD's signature and will update patient and family when bed offers are made.   Assessment/plan status:  Psychosocial Support/Ongoing Assessment of Needs Other assessment/ plan:   Information/referral to community resources:   SNF packet    PATIENTS/FAMILYS RESPONSE TO PLAN OF CARE: Patient is agreeable for CSW to initiate SNF search in [facility] for short term SNF placement.

## 2013-02-15 LAB — URINALYSIS, ROUTINE W REFLEX MICROSCOPIC
Bilirubin Urine: NEGATIVE
Glucose, UA: NEGATIVE mg/dL
Hgb urine dipstick: NEGATIVE
Ketones, ur: NEGATIVE mg/dL
Protein, ur: NEGATIVE mg/dL

## 2013-02-15 LAB — BASIC METABOLIC PANEL
BUN: 3 mg/dL — ABNORMAL LOW (ref 6–23)
CO2: 25 mEq/L (ref 19–32)
Chloride: 106 mEq/L (ref 96–112)
Creatinine, Ser: 0.36 mg/dL — ABNORMAL LOW (ref 0.50–1.10)

## 2013-02-15 MED ORDER — DOCUSATE SODIUM 100 MG PO CAPS
100.0000 mg | ORAL_CAPSULE | Freq: Two times a day (BID) | ORAL | Status: DC
  Administered 2013-02-15 – 2013-02-17 (×5): 100 mg via ORAL
  Filled 2013-02-15 (×5): qty 1

## 2013-02-15 MED ORDER — POTASSIUM CHLORIDE CRYS ER 20 MEQ PO TBCR
40.0000 meq | EXTENDED_RELEASE_TABLET | Freq: Once | ORAL | Status: AC
  Administered 2013-02-15: 40 meq via ORAL
  Filled 2013-02-15: qty 2

## 2013-02-15 MED ORDER — ENSURE COMPLETE PO LIQD
237.0000 mL | Freq: Two times a day (BID) | ORAL | Status: DC
  Administered 2013-02-15 – 2013-02-17 (×5): 237 mL via ORAL

## 2013-02-15 NOTE — Progress Notes (Signed)
Patient ID: Ashley Burnett, female   DOB: 20-Jan-1918, 79 y.o.   MRN: [id number] P6 Days Post-Op  Subjective: Pt feels good, denies pain, tolerating diet ok, +BM  Objective: Vital signs in last 24 hours: Temp:  [97.9 F (36.6 C)-98.5 F (36.9 C)] 98.5 F (36.9 C) (05/27 0507) Pulse Rate:  [78-93] 89 (05/27 0507) Resp:  [16-18] 16 (05/27 0507) BP: (129-155)/(61-81) 144/81 mmHg (05/27 0507) SpO2:  [96 %-99 %] 99 % (05/27 0507) Last BM Date: 02/13/13  Intake/Output from previous day: 05/26 0701 - 05/27 0700 In: 780 [P.O.:180; I.V.:600] Out: 1756 [Urine:1750; Stool:6] Intake/Output this shift: Total I/O In: -  Out: 450 [Urine:450]  PE: Abd: soft, minimally tender, wound is clean and packed.  +BS, minimal distention  Lab Results:  No results found for this basename: WBC, HGB, HCT, PLT,  in the last 72 hours BMET  Recent Labs  02/14/13 0350 02/15/13 0505  NA 137 139  K 3.0* 3.3*  CL 103 106  CO2 26 25  GLUCOSE 137* 117*  BUN 5* 3*  CREATININE 0.36* 0.36*  CALCIUM 7.9* 8.1*   PT/INR No results found for this basename: LABPROT, INR,  in the last 72 hours CMP     Component Value Date/Time   NA 139 02/15/2013 0505   K 3.3* 02/15/2013 0505   CL 106 02/15/2013 0505   CO2 25 02/15/2013 0505   GLUCOSE 117* 02/15/2013 0505   GLUCOSE 104* 08/06/2006 1001   BUN 3* 02/15/2013 0505   CREATININE 0.36* 02/15/2013 0505   CALCIUM 8.1* 02/15/2013 0505   PROT 7.6 02/04/2013 1321   ALBUMIN 4.2 02/04/2013 1321   AST 22 02/04/2013 1321   ALT 14 02/04/2013 1321   ALKPHOS 66 02/04/2013 1321   BILITOT 0.6 02/04/2013 1321   GFRNONAA >90 02/15/2013 0505   GFRAA >90 02/15/2013 0505   Lipase     Component Value Date/Time   LIPASE 17 02/04/2013 1321       Studies/Results: No results found.  Anti-infectives: Anti-infectives   Start     Dose/Rate Route Frequency Ordered Stop   02/09/13 1037  dextrose 5 % with cefOXitin (MEFOXIN) ADS Med    Comments:  MCMILLEN, MIKE: cabinet override   02/09/13 1037 02/09/13 2244       Assessment/Plan  1. S/p ex lap with LOA 2. Post op ileus, resolving 3. Dementia 4. Deconditioning  Plan: 1. Advance to soft 2. Follow K, 3.3 today 3. SW consult for SNF placement   LOS: 11 days    Ashley Burnett 02/15/2013, 10:04 AM

## 2013-02-15 NOTE — Progress Notes (Signed)
6 Days Post-Op  Subjective: C/o abd soreness. No n/v.   Objective: Vital signs in last 24 hours: Temp:  [97.2 F (36.2 C)-98.5 F (36.9 C)] 97.2 F (36.2 C) (05/27 0950) Pulse Rate:  [78-93] 82 (05/27 0950) Resp:  [16-18] 17 (05/27 0950) BP: (113-155)/(61-81) 113/64 mmHg (05/27 0950) SpO2:  [96 %-99 %] 99 % (05/27 0950) Last BM Date: 02/13/13  Intake/Output from previous day: 05/26 0701 - 05/27 0700 In: 780 [P.O.:180; I.V.:600] Out: 1756 [Urine:1750; Stool:6] Intake/Output this shift: Total I/O In: -  Out: 450 [Urine:450]  Alert, nad Soft, nt, full  Lab Results:  No results found for this basename: WBC, HGB, HCT, PLT,  in the last 72 hours BMET  Recent Labs  02/14/13 0350 02/15/13 0505  NA 137 139  K 3.0* 3.3*  CL 103 106  CO2 26 25  GLUCOSE 137* 117*  BUN 5* 3*  CREATININE 0.36* 0.36*  CALCIUM 7.9* 8.1*   PT/INR No results found for this basename: LABPROT, INR,  in the last 72 hours ABG No results found for this basename: PHART, PCO2, PO2, HCO3,  in the last 72 hours  Studies/Results: No results found.  Anti-infectives: Anti-infectives   Start     Dose/Rate Route Frequency Ordered Stop   02/09/13 1037  dextrose 5 % with cefOXitin (MEFOXIN) ADS Med    Comments:  MCMILLEN, MIKE: cabinet override      02/09/13 1037 02/09/13 2244      Assessment/Plan: s/p Procedure(s): EXPLORATORY LAPAROTOMY (N/A) COLON RESECTION (N/A)  Hypokalemia - replace potassium Adv diet Ensure shakes Pt/ot Placement Stool softner  Mary Sella. Andrey Campanile, MD, FACS General, Bariatric, & Minimally Invasive Surgery [facility], [place]   LOS: 11 days    Atilano Ina 02/15/2013

## 2013-02-15 NOTE — Progress Notes (Signed)
Physical Therapy Treatment Patient Details Name: Ashley Burnett MRN: [id number] DOB: 25-Mar-1918 Today's Date: 02/15/2013 Time: 9629-5284 PT Time Calculation (min): 25 min  PT Assessment / Plan / Recommendation Comments on Treatment Session  Pt s/p bowel resection and lysis of adhesions due to SBO.  Pt making steady progress and very motivated. Patient requiring some rest break this session. Fatiques quickly. Patient expressing concerns about getting back to her baseline of functioning. Discussed how STSNF would work with her doing therapy and the goal of her returning back home    Follow Up Recommendations  SNF     Does the patient have the potential to tolerate intense rehabilitation     Barriers to Discharge        Equipment Recommendations  Rolling walker with 5" wheels    Recommendations for Other Services    Frequency Min 3X/week   Plan Discharge plan remains appropriate;Frequency remains appropriate    Precautions / Restrictions Precautions Precautions: Fall   Pertinent Vitals/Pain Denies pain    Mobility  Bed Mobility Supine to Sit: 4: Min assist Sit to Supine: HOB elevated;4: Min assist Details for Bed Mobility Assistance: A for trunk support and to bring legs back into bed. Heavy reliance on rails Transfers Sit to Stand: 4: Min assist;With upper extremity assist;From bed;From chair/3-in-1 Stand to Sit: To chair/3-in-1;To bed;With upper extremity assist;4: Min assist Details for Transfer Assistance: Verbal cues for hand placement. A for balance Ambulation/Gait Ambulation/Gait Assistance: 4: Min assist Ambulation Distance (Feet): 250 Feet Assistive device: Rolling walker Ambulation/Gait Assistance Details: Cue for positioning and management of RW, especially in tight spaces Gait Pattern: Step-through pattern;Decreased step length - right;Decreased step length - left;Shuffle;Trunk flexed;Narrow base of support    Exercises     PT Diagnosis:    PT Problem List:    PT Treatment Interventions:     PT Goals Acute Rehab PT Goals PT Goal: Supine/Side to Sit - Progress: Progressing toward goal PT Goal: Sit to Supine/Side - Progress: Progressing toward goal PT Goal: Sit to Stand - Progress: Progressing toward goal PT Goal: Stand to Sit - Progress: Progressing toward goal PT Goal: Ambulate - Progress: Progressing toward goal  Visit Information  Last PT Received On: 02/15/13 Assistance Needed: +1    Subjective Data      Cognition  Cognition Arousal/Alertness: Awake/alert Behavior During Therapy: WFL for tasks assessed/performed Overall Cognitive Status: Within Functional Limits for tasks assessed Area of Impairment: Memory Memory: Decreased short-term memory    Balance     End of Session PT - End of Session Activity Tolerance: Patient tolerated treatment well Patient left: Other (comment);with call bell/phone within reach   GP     Robinette, Adline Potter 02/15/2013, 12:04 PM 02/15/2013 Fredrich Birks PTA [phone] pager [phone] office

## 2013-02-16 LAB — BASIC METABOLIC PANEL
BUN: 4 mg/dL — ABNORMAL LOW (ref 6–23)
CO2: 24 mEq/L (ref 19–32)
Calcium: 8.6 mg/dL (ref 8.4–10.5)
Creatinine, Ser: 0.44 mg/dL — ABNORMAL LOW (ref 0.50–1.10)
Glucose, Bld: 105 mg/dL — ABNORMAL HIGH (ref 70–99)

## 2013-02-16 MED ORDER — PANTOPRAZOLE SODIUM 40 MG PO TBEC
40.0000 mg | DELAYED_RELEASE_TABLET | Freq: Every day | ORAL | Status: DC
  Administered 2013-02-16 – 2013-02-17 (×2): 40 mg via ORAL
  Filled 2013-02-16 (×2): qty 1

## 2013-02-16 MED ORDER — PANTOPRAZOLE SODIUM 40 MG PO PACK
40.0000 mg | PACK | Freq: Every day | ORAL | Status: DC
  Filled 2013-02-16: qty 20

## 2013-02-16 NOTE — Care Management Note (Signed)
  Page 2 of 2   02/17/2013     3:22:25 PM   CARE MANAGEMENT NOTE 02/17/2013  Patient:  NAZIYA, HEGWOOD   Account Number:  [id number]  Date Initiated:  02/08/2013  Documentation initiated by:  Ronny Flurry  Subjective/Objective Assessment:   DX:  SBO (small bowel obstruction)  I think she needs elap with loa now- per surgery     Action/Plan:   PTA pt lived at home alone- PT ordered post op- NCM to follow for progression and recommendations   Anticipated DC Date:  02/17/2013   Anticipated DC Plan:  SKILLED NURSING FACILITY  In-house referral  Clinical Social Worker      DC Planning Services  CM consult      Choice offered to / List presented to:             Status of service:  In process, will continue to follow Medicare Important Message given?   (If response is "NO", the following Medicare IM given date fields will be blank) Date Medicare IM given:   Date Additional Medicare IM given:    Discharge Disposition:    Per UR Regulation:  Reviewed for med. necessity/level of care/duration of stay  If discussed at Long Length of Stay Meetings, dates discussed:    Comments:    02-17-13 Spoke with patient's daughter Autumne Kallio who states she is receiving a fax from [place] [facility] [place] at present and she will sign papers and fax back to [place] . FL 2 signed . Left voice mail and text message for covering social worker.  Faxed patient's insurance card to registeration also   Ronny Flurry RN BSN [phone]   02-16-13 Called and faxed copy of insurance to registration phone [phone], fax [phone]  Ronny Flurry RN BSN   02-16-13 Spoke to patient's daughter Bernardina Cacho . Daughter states she is unable to take her mother home and provide 24 hour care .  Daughter wants patinet to go to SNF for rehab before returning home. CSW received notification from SNF that the insurance listed on file is only for prescriptions. Meet with patient's grand daughter Ms Suezanne Jacquet [phone]  in patient's room .  Ms Suezanne Jacquet used my computer to pull up patients AARP Medicare Supplement Plus Plan same printed and faxed to [place] at [employer] .  Ronny Flurry RN BSN [phone]    02/11/13- 1000- Donn Pierini RN, BSN [phone] PT eval recommending SNF- will consult CSW for placement needs. Pt still with NGT and resolving ileus.   02/10/13- 1100- Donn Pierini RN, BSN [phone] pt s/p exp. lap on 02/09/13- PT eval ordered and pending

## 2013-02-16 NOTE — Progress Notes (Signed)
7 Days Post-Op  Subjective: Pt feels good.  Having little pain, +BM's and flatus.  Mobilizing with PT.  Tolerating food.    Objective: Vital signs in last 24 hours: Temp:  [97.2 F (36.2 C)-98.7 F (37.1 C)] 98.5 F (36.9 C) (05/28 0455) Pulse Rate:  [81-101] 87 (05/28 0455) Resp:  [16-20] 20 (05/28 0455) BP: (113-155)/(59-92) 155/64 mmHg (05/28 0455) SpO2:  [95 %-99 %] 98 % (05/28 0455) Last BM Date: 02/14/13  Intake/Output from previous day: 05/27 0701 - 05/28 0700 In: 1830 [P.O.:480; I.V.:1350] Out: 2752 [Urine:2750; Stool:2] Intake/Output this shift: Total I/O In: -  Out: 450 [Urine:450]  PE: Gen:  Alert, NAD, pleasant Abd: Soft, NT/ND, +BS, no HSM, abdominal midline wound appears clean, no significant drainage, good granulation tissue  Lab Results:  No results found for this basename: WBC, HGB, HCT, PLT,  in the last 72 hours BMET  Recent Labs  02/14/13 0350 02/15/13 0505  NA 137 139  K 3.0* 3.3*  CL 103 106  CO2 26 25  GLUCOSE 137* 117*  BUN 5* 3*  CREATININE 0.36* 0.36*  CALCIUM 7.9* 8.1*   PT/INR No results found for this basename: LABPROT, INR,  in the last 72 hours CMP     Component Value Date/Time   NA 139 02/15/2013 0505   K 3.3* 02/15/2013 0505   CL 106 02/15/2013 0505   CO2 25 02/15/2013 0505   GLUCOSE 117* 02/15/2013 0505   GLUCOSE 104* 08/06/2006 1001   BUN 3* 02/15/2013 0505   CREATININE 0.36* 02/15/2013 0505   CALCIUM 8.1* 02/15/2013 0505   PROT 7.6 02/04/2013 1321   ALBUMIN 4.2 02/04/2013 1321   AST 22 02/04/2013 1321   ALT 14 02/04/2013 1321   ALKPHOS 66 02/04/2013 1321   BILITOT 0.6 02/04/2013 1321   GFRNONAA >90 02/15/2013 0505   GFRAA >90 02/15/2013 0505   Lipase     Component Value Date/Time   LIPASE 17 02/04/2013 1321       Studies/Results: No results found.  Anti-infectives: Anti-infectives   Start     Dose/Rate Route Frequency Ordered Stop   02/09/13 1037  dextrose 5 % with cefOXitin (MEFOXIN) ADS Med    Comments:   MCMILLEN, MIKE: cabinet override      02/09/13 1037 02/09/13 2244       Assessment/Plan 1. S/p ex lap with LOA  2. Post op ileus, appears resolved, tolerating reg diet 3. Dementia  4. Deconditioning   Plan:  1. Tolerating soft diet 2. Follow K, 3.3 yesterday, supplemented, recheck today 3. PT recommending SNF placement vs 24 hour supervision 4. Can go today if bed available vs home with 24 hour supervision, SW and CM to check with family to see if 24 hour is possible today.  If so then she could go today.    LOS: 12 days    Ashley Burnett, Ashley Burnett 02/16/2013, 9:15 AM Pager: [phone]

## 2013-02-16 NOTE — Progress Notes (Signed)
Physical Therapy Treatment Patient Details Name: Ashley Burnett MRN: [id number] DOB: 1918/06/25 Today's Date: 02/16/2013 Time: 4098-1191 PT Time Calculation (min): 32 min  PT Assessment / Plan / Recommendation Comments on Treatment Session  Pt s/p bowel resection and lysis of adhesions due to SBO.  Pt making steady progress and very motivated.  Patient became tearful during end of treatment stating she felt as if she will never be home again and she felt as if she is getting depressed. RN was made aware of conversation. Spoke with PA this morning. Patient is not safe to DC home unless she has 24/7 supervision    Follow Up Recommendations  SNF     Does the patient have the potential to tolerate intense rehabilitation     Barriers to Discharge        Equipment Recommendations  Rolling walker with 5" wheels    Recommendations for Other Services    Frequency Min 3X/week   Plan Discharge plan remains appropriate;Frequency remains appropriate    Precautions / Restrictions Precautions Precautions: Fall   Pertinent Vitals/Pain     Mobility  Bed Mobility Bed Mobility: Not assessed Transfers Sit to Stand: 4: Min assist;With upper extremity assist;From bed;From chair/3-in-1 Stand to Sit: To chair/3-in-1;To bed;With upper extremity assist;4: Min assist Details for Transfer Assistance: Verbal cues for hand placement. A for balance Ambulation/Gait Ambulation/Gait Assistance: 4: Min assist Ambulation Distance (Feet): 250 Feet Ambulation/Gait Assistance Details: cues for posture and RW Gait Pattern: Step-through pattern;Decreased step length - right;Decreased step length - left;Shuffle;Trunk flexed;Narrow base of support Gait velocity: decreased    Exercises     PT Diagnosis:    PT Problem List:   PT Treatment Interventions:     PT Goals Acute Rehab PT Goals PT Goal: Sit to Stand - Progress: Progressing toward goal PT Goal: Stand to Sit - Progress: Progressing toward goal PT  Goal: Ambulate - Progress: Progressing toward goal  Visit Information  Last PT Received On: 02/16/13 Assistance Needed: +1    Subjective Data      Cognition  Cognition Arousal/Alertness: Awake/alert Behavior During Therapy: WFL for tasks assessed/performed Area of Impairment: Memory Memory: Decreased short-term memory    Balance     End of Session PT - End of Session Equipment Utilized During Treatment: Gait belt Activity Tolerance: Patient tolerated treatment well Patient left: Other (comment);with call bell/phone within reach Nurse Communication: Mobility status   GP     Fredrich Birks 02/16/2013, 12:45 PM 02/16/2013 Fredrich Birks PTA [phone] pager [phone] office

## 2013-02-16 NOTE — Progress Notes (Signed)
I have seen and examined the pt and agree with PA-Dort's progress note.  Con't to adv diet as tol Dispo pending

## 2013-02-16 NOTE — Clinical Social Work Note (Addendum)
Clinical Social Worker spoke with patient's daughter, and she reported that she would like to proceed with [facility]. CSW received notification from SNF that the insurance listed on file is only for prescriptions. CSW informed the daughter and daughter became very stressed and concerned about discharge plans.CSW inquired about additional insurance plans and if daughter could search  CSW received voice message from daughter later that stated that patient has [employer] for [employer] and will update CSW when she receives policy number. CSW left voice message for administrator at [facility]. CSW will continue to follow.   15:40pm CSW received number for [employer] provided by patient's daughter via phone and CSW provided information to [facility]. [facility] ran number but was not able to pull patient's information. CM spoke with granddaughter and was able to print off the information, which will be faxed to facility to re-attempt insurance check.   16:15pm CSW spoke with [facility] and she received the supplement insurance information, but she is requesting the patients valid Medicare card and number. She suspects the patient is under her husbands insurance. CSW spoke with granddaughter and she will attempt to find the proper medicare card.   Ashley Burnett MSW, [employer] [phone]

## 2013-02-17 MED ORDER — FLUOXETINE HCL 20 MG PO CAPS
20.0000 mg | ORAL_CAPSULE | Freq: Every day | ORAL | Status: DC
  Administered 2013-02-17: 20 mg via ORAL
  Filled 2013-02-17: qty 1

## 2013-02-17 MED ORDER — DSS 100 MG PO CAPS: 100.0000 mg | ORAL_CAPSULE | Freq: Two times a day (BID) | ORAL | Status: DC

## 2013-02-17 MED ORDER — PANTOPRAZOLE SODIUM 40 MG PO TBEC: 40.0000 mg | DELAYED_RELEASE_TABLET | Freq: Every day | ORAL | Status: DC

## 2013-02-17 MED ORDER — HYDROCODONE-ACETAMINOPHEN 5-325 MG PO TABS: 1.0000 | ORAL_TABLET | Freq: Four times a day (QID) | ORAL | Status: DC | PRN

## 2013-02-17 MED ORDER — ACETAMINOPHEN 325 MG PO TABS: 650.0000 mg | ORAL_TABLET | Freq: Four times a day (QID) | ORAL | Status: AC | PRN

## 2013-02-17 MED ORDER — ENSURE COMPLETE PO LIQD: 237.0000 mL | Freq: Two times a day (BID) | ORAL | Status: DC

## 2013-02-17 MED ORDER — FLUOXETINE HCL 20 MG PO CAPS: 20.0000 mg | ORAL_CAPSULE | Freq: Every day | ORAL | Status: DC

## 2013-02-17 NOTE — Progress Notes (Signed)
CSW spoke with pt's dtr via phone to inform her of pt's tx to [facility].  Pt's dtr very angry about events of the day.  CSW provided supportive listening and emotional support.  Dtr and pt both agreeable to tx.  PTAR called.

## 2013-02-17 NOTE — Clinical Social Work Note (Signed)
CSW assisting unit CSW in d/c for pt.  CSW contacted [facility] to make sure pt has received fax.  [facility] confirmed that pt received fax.  [facility] has not received the information faxed back as of yet.  CSW gave report to  covering CSW, Augusto Gamble.  CSW gave [facility] Jody's phone number for contact.   Daughter's cell # - Verta Riedlinger (also daughter's name) [phone]

## 2013-02-17 NOTE — Discharge Instructions (Addendum)
Abdominal Wound: Wet to dry twice daily  Sacral Decubitus: Apply Allevyn Gentle border to area and check at least once daily Avoid pressure to area and turn frequently Notify provider if area worsens Wash area well and keep dry  Dysphagia Diet Walk frequently and continue physical therapy but no lifting more then 10 pounds      CCS      [facility], [place] [phone]  OPEN ABDOMINAL SURGERY: POST OP INSTRUCTIONS  Always review your discharge instruction sheet given to you by the facility where your surgery was performed.  IF YOU HAVE DISABILITY OR FAMILY LEAVE FORMS, YOU MUST BRING THEM TO THE OFFICE FOR PROCESSING.  PLEASE DO NOT GIVE THEM TO YOUR DOCTOR.  1. A prescription for pain medication may be given to you upon discharge.  Take your pain medication as prescribed, if needed.  If narcotic pain medicine is not needed, then you may take acetaminophen (Tylenol) or ibuprofen (Advil) as needed. 2. Take your usually prescribed medications unless otherwise directed. 3. If you need a refill on your pain medication, please contact your pharmacy. They will contact our office to request authorization.  Prescriptions will not be filled after 5pm or on week-ends. 4. You should follow a light diet the first few days after arrival home, such as soup and crackers, pudding, etc.unless your doctor has advised otherwise. A high-fiber, low fat diet can be resumed as tolerated.   Be sure to include lots of fluids daily. Most patients will experience some swelling and bruising on the chest and neck area.  Ice packs will help.  Swelling and bruising can take several days to resolve 5. Most patients will experience some swelling and bruising in the area of the incision. Ice pack will help. Swelling and bruising can take several days to resolve..  6. It is common to experience some constipation if taking pain medication after surgery.  Increasing fluid intake and taking a stool softener will  usually help or prevent this problem from occurring.  A mild laxative (Milk of Magnesia or Miralax) should be taken according to package directions if there are no bowel movements after 48 hours. 7.  You may have steri-strips (small skin tapes) in place directly over the incision.  These strips should be left on the skin for 7-10 days.  If your surgeon used skin glue on the incision, you may shower in 24 hours.  The glue will flake off over the next 2-3 weeks.  Any sutures or staples will be removed at the office during your follow-up visit. You may find that a light gauze bandage over your incision may keep your staples from being rubbed or pulled. You may shower and replace the bandage daily. 8. ACTIVITIES:  You may resume regular (light) daily activities beginning the next day--such as daily self-care, walking, climbing stairs--gradually increasing activities as tolerated.  You may have sexual intercourse when it is comfortable.  Refrain from any heavy lifting or straining until approved by your doctor. a. You may drive when you no longer are taking prescription pain medication, you can comfortably wear a seatbelt, and you can safely maneuver your car and apply brakes b. Return to Work: ___________________________________ 9. You should see your doctor in the office for a follow-up appointment approximately two weeks after your surgery.  Make sure that you call for this appointment within a day or two after you arrive home to insure a convenient appointment time. OTHER INSTRUCTIONS:  _____________________________________________________________ _____________________________________________________________  WHEN TO CALL  YOUR DOCTOR: 1. Fever over 101.0 2. Inability to urinate 3. Nausea and/or vomiting 4. Extreme swelling or bruising 5. Continued bleeding from incision. 6. Increased pain, redness, or drainage from the incision. 7. Difficulty swallowing or breathing 8. Muscle cramping or  spasms. 9. Numbness or tingling in hands or feet or around lips.  The clinic staff is available to answer your questions during regular business hours.  Please don't hesitate to call and ask to speak to one of the nurses if you have concerns.  For further questions, please visit [email or web address]

## 2013-02-17 NOTE — Clinical Social Work Note (Addendum)
Clinical Social Worker received notification from preferred facility, [facility] that they have no availability today. CSW informed granddaughter, who became upset and when CSW was listing the available facilities from the initial referral, granddaughter interrupted CSW and stated that CSW will need to contact her mother. CSW left message for patient's daughter. Daughter returned call and was also upset. CSW listed the two facilities that responded with availability, [facility] and [facility] and daughter stated that she needed more time to research the facilities. CSW attempted to calm daughter down. CSW informed daughter that there is a discharge summary and order and will need to have a second choice today. Daughter did not respond with next best choice and stated that she will call CSW back. CSW is continuing to follow.   14:06pm CSW received another call from daughter and she repeatedly stated that she can't make a decision based on the two facilities that were available. Daughter reported that she was not satisfied with available options. CSW informed daughter that a decision will need to be made and daughter stated that she has not had time to research the facilities.   14:47pm CSW received notification from [facility] that they had a discharge to fall through and would be able to accept patient today. CSW informed daughter. Facility is making arrangements with daughter to fax admission paperwork for daughter to complete. Patient will be transported via ambulance.   Rozetta Nunnery MSW, [employer] [phone]

## 2013-02-17 NOTE — Discharge Summary (Signed)
Physician Discharge Summary  Patient ID: Ashley Burnett MRN: [id number] DOB/AGE: 77/18/1919 77 y.o.  Admit date: 02/04/2013 Discharge date: 02/17/2013  Admitting Diagnosis: Abdominal pain Nausea Small bowel obstruction  Discharge Diagnosis Patient Active Problem List   Diagnosis Date Noted  . SBO (small bowel obstruction) 02/04/2013  . Nausea & vomiting 02/04/2013  . Abdominal pain, acute, left lower quadrant 02/04/2013  . DOE (dyspnea on exertion) 12/20/2010  . MEMORY LOSS 04/12/2010  . FRACTURE, WRIST, RIGHT 04/12/2010  . PALPITATIONS 02/21/2010  . FATIGUE 01/07/2010  . DYSPNEA ON EXERTION 01/07/2010  . BACK PAIN WITH RADICULOPATHY 08/10/2009  . POSTMENOPAUSAL STATUS 03/09/2009  . Abdominal pain, epigastric 11/30/2008  . CONSTIPATION 10/19/2008  . HEAD TRAUMA, BLUNT 01/27/2008  . CLOS FX LUMB VERTEBRA W/O MENTION SP CORD INJURY 12/13/2007  . GASTRITIS 12/06/2007  . LOW BACK PAIN, CHRONIC 12/03/2007  . CONTUSION OF UNSPECIFIED PART OF LOWER LIMB 12/03/2007  . ARTIFICIAL MENOPAUSE 08/02/2007  . DEPRESSION 03/23/2007  . CONTUSION, HEAD 02/23/2007  . DEPRESSION, CHRONIC, HX OF 02/23/2007  . MITRAL VALVE PROLAPSE 02/19/2007  . GERD 02/19/2007  . Diverticulosis of Colon (without Mention of Hemorrhage) 02/19/2007  . OSTEOPOROSIS 02/19/2007  . TRANSIENT ISCHEMIC ATTACK, HX OF 02/19/2007  . ANXIETY STATE NOS 02/02/2007  . DISORDER, TONGUE NOS 02/02/2007    Consultants Internal Medicine  Procedures 02/09/13 Dr. Dwain Sarna #1 exploratory laparotomy with lysis of adhesions  #2 small bowel resection with primary anastomosis  Hospital Course:  79 year old female with dementia who presents with what appeared to be an adhesive small bowel obstruction. We made every attempt to manage her conservatively. She was not agreeable to undergoing a procedure for this. After a long conversation with the patient, her daughter, and her granddaughter we decided to proceed with surgery as we  discussed the options at this point either be hospice care at home or surgery. We had a lengthy conversation about the risks of surgery as well as the postoperative recovery her functional status postop.  She opted for surgery and underwent the procedure above.  Her post-op course was complicated by mild post-op ileus and deconditioning.  Due to this PT was order and it was recommended that she be discharged to a SNF.  On 5/29, her bowel function was good, she was eating a soft diet, she was walking but with PT, pain was controlled.  She did have a stage I sacral decubitus that is being treated with pressure avoidance and a gentle border.  She has an open abdominal wound that will get wet to dry dressings twice daily.  She will continue BID dressing changes.  She will see Dr. Dwain Sarna in 10 days for wound check.  We will also start her on Prozac due to situational depression.       Medication List    STOP taking these medications       amoxicillin 500 MG capsule  Commonly known as:  AMOXIL      TAKE these medications       acetaminophen 325 MG tablet  Commonly known as:  TYLENOL  Take 2 tablets (650 mg total) by mouth every 6 (six) hours as needed.     DSS 100 MG Caps  Take 100 mg by mouth 2 (two) times daily.     feeding supplement Liqd  Take 237 mLs by mouth 2 (two) times daily between meals.     fluocinonide cream 0.05 %  Commonly known as:  LIDEX  Apply 1 application topically daily.  FLUoxetine 20 MG capsule  Commonly known as:  PROZAC  Take 1 capsule (20 mg total) by mouth daily.     HYDROcodone-acetaminophen 5-325 MG per tablet  Commonly known as:  NORCO/VICODIN  Take 1 tablet by mouth every 6 (six) hours as needed for pain.     pantoprazole 40 MG tablet  Commonly known as:  PROTONIX  Take 1 tablet (40 mg total) by mouth daily.             Follow-up Information   Follow up with Emelia Loron, MD On 02/28/2013. (Arrive at 11:15 for 11:45 appt.)    Contact  information:   [address] [place] [address] [phone]       Signed: Denny Levy [facility] [phone]  02/17/2013, 12:07 PM

## 2013-02-28 ENCOUNTER — Ambulatory Visit (INDEPENDENT_AMBULATORY_CARE_PROVIDER_SITE_OTHER): Payer: Medicare Other | Admitting: General Surgery

## 2013-02-28 ENCOUNTER — Encounter (INDEPENDENT_AMBULATORY_CARE_PROVIDER_SITE_OTHER): Payer: Self-pay | Admitting: General Surgery

## 2013-02-28 VITALS — BP 138/82 | HR 82 | Temp 97.7°F | Resp 16 | Ht 67.0 in | Wt 86.2 lb

## 2013-02-28 DIAGNOSIS — Z09 Encounter for follow-up examination after completed treatment for conditions other than malignant neoplasm: Secondary | ICD-10-CM

## 2013-02-28 NOTE — Progress Notes (Signed)
Subjective:     Patient ID: Ashley Burnett, female   DOB: 09-May-1918, 78 y.o.   MRN: [id number]  HPI This is a 79 year old female who had a small bowel obstruction from prior surgery. She underwent a laparotomy with lysis of adhesions and a small bowel resection. Postoperatively she has actually done fairly well. She is now on a skilled facility. She returns today. History is obtained from her. She does have a normal appetite is down 86 pounds right now. She states if she eats about a quarter of her food and I don't know that she is getting supplements in addition to that. She is not any nausea or vomiting. Her wound is doing well.  Review of Systems     Objective:   Physical Exam Wound is healing by secondary intention. There is no evidence of infection her abdomen is soft    Assessment:     Status post laparotomy with small bowel resection for small bowel obstruction     Plan:     She continues to do very well. Mentally she is the best I have seen her. I will have her come back in 4 weeks. She continue daily wet-to-dry dressing changes. This hopefully will heal up over the next several weeks. She can certainly shower with this. I think it is important for her to increase her nutrition as much as she absolutely can't. I would recommend giving her 2 or 3 ensures per day in addition to a regular diet as well.

## 2013-03-11 ENCOUNTER — Ambulatory Visit (INDEPENDENT_AMBULATORY_CARE_PROVIDER_SITE_OTHER): Payer: Medicare Other | Admitting: General Surgery

## 2013-03-11 ENCOUNTER — Encounter (INDEPENDENT_AMBULATORY_CARE_PROVIDER_SITE_OTHER): Payer: Self-pay | Admitting: General Surgery

## 2013-03-11 VITALS — BP 138/76 | HR 88 | Resp 18 | Ht 67.0 in | Wt 89.0 lb

## 2013-03-11 DIAGNOSIS — Z09 Encounter for follow-up examination after completed treatment for conditions other than malignant neoplasm: Secondary | ICD-10-CM

## 2013-03-11 NOTE — Progress Notes (Signed)
Subjective:     Patient ID: Ashley Burnett, female   DOB: 05-Jan-1918, 79 y.o.   MRN: [id number]  HPI  This is a 79 year old female who was in the hospital with a small bowel obstruction. She was given every opportunity to open up and just did not. I then taken to the operating room and performed a laparotomy with lysis of adhesions and a small bowel resection. She did well postoperatively. She is now in a facility and is very slowly improving. She is having bowel movements and is able to eat but her appetite is not normal area she is a fair amount of fatigue. She is frustrated that she is not improving quicker. Review of Systems     Objective:   Physical Exam    Healing midline incision without infection, I applied silver nitrate to this Assessment:     Status post laparotomy for small bowel obstruction     Plan:     I think she should continue to improve for some time. She may not very well get back to her baseline prior to this though. I encourage her to continue her therapies as well as increasing her diet. She does have a dry dressing placed over her wound daily. I will have her come back to see me in 4 weeks to evaluate her wound again.

## 2013-04-05 ENCOUNTER — Encounter (INDEPENDENT_AMBULATORY_CARE_PROVIDER_SITE_OTHER): Payer: Medicare Other | Admitting: General Surgery

## 2013-04-07 ENCOUNTER — Telehealth: Payer: Self-pay | Admitting: Family Medicine

## 2013-04-07 NOTE — Telephone Encounter (Signed)
Spoke with cheryl and she will try to get the orders from the general surgeon.     KP

## 2013-04-07 NOTE — Telephone Encounter (Signed)
Where did she come from ---I haven't seen her in a year and a half.

## 2013-04-07 NOTE — Telephone Encounter (Signed)
Please advise, patient was last seen 10/06/11.    KP

## 2013-04-07 NOTE — Telephone Encounter (Signed)
Cheryl from [facility] called to see if dr Laury Axon would send orders for nursing and therapy for Ashley Burnett?

## 2013-04-07 NOTE — Telephone Encounter (Signed)
Cheryl with [facility] is calling back to check the status on the request for nursing orders. Orders can be faxed to [phone].

## 2013-04-08 ENCOUNTER — Telehealth (INDEPENDENT_AMBULATORY_CARE_PROVIDER_SITE_OTHER): Payer: Self-pay | Admitting: *Deleted

## 2013-04-08 NOTE — Telephone Encounter (Signed)
Received a call from [facility] with Home Health to see if Dr. Dwain Sarna would approve patient for home health and PT.  She states that patient does not qualify for hospice care at this time.  She also states that they tried to get the order from patient's PMD however patient has not been seen by them in over a year and the order has to come from someone who has seen the patient in the last 90 days.  Tried to explain that because we are a surgeons office we typically are only able to write home health orders for patients with needs following their surgery.  Elnita Maxwell then states the daughter is requesting home health due to help with dressing changes that patient is still having to do from her 02/09/13 surgery.  Explained to Williams that Dr. Dwain Sarna is unavailable until next week and this would have to be approved by him.  She states understanding at this time but states she is really hoping for a response on Monday since patient is due for a dressing change on Tuesday.

## 2013-04-11 NOTE — Telephone Encounter (Signed)
Called Ashley Burnett to notify her that Dr Dwain Sarna said you could order any home health with his name for the pt that it would not be a problem. Ashley Burnett took a verbal order from me.

## 2013-04-12 ENCOUNTER — Telehealth (INDEPENDENT_AMBULATORY_CARE_PROVIDER_SITE_OTHER): Payer: Self-pay | Admitting: General Surgery

## 2013-04-12 NOTE — Telephone Encounter (Signed)
Linda with [facility] calling to let us know that she is monitoring patient's wound. It is healed but there is an area of redness where it looks like a suture is trying to come through the skin. She is just cleaning the wound and covering with a dry dressing for now until patient is seen by Dr Dwain Sarna next week. She will call back if redness worsens or any other problems.

## 2013-04-18 ENCOUNTER — Encounter (INDEPENDENT_AMBULATORY_CARE_PROVIDER_SITE_OTHER): Payer: Self-pay | Admitting: General Surgery

## 2013-04-18 ENCOUNTER — Ambulatory Visit (INDEPENDENT_AMBULATORY_CARE_PROVIDER_SITE_OTHER): Payer: Medicare Other | Admitting: General Surgery

## 2013-04-18 VITALS — BP 106/62 | HR 80 | Temp 98.4°F | Resp 14 | Ht 64.0 in | Wt 87.4 lb

## 2013-04-18 DIAGNOSIS — Z09 Encounter for follow-up examination after completed treatment for conditions other than malignant neoplasm: Secondary | ICD-10-CM

## 2013-04-18 NOTE — Progress Notes (Signed)
Subjective:     Patient ID: Ashley Burnett, female   DOB: 04-22-1918, 79 y.o.   MRN: [id number]  HPI This is a 79 year old female who was in the hospital with a small bowel obstruction. She was given every opportunity to open up and just did not. I then taken to the operating room and performed a laparotomy with lysis of adhesions and a small bowel resection. She did well postoperatively. She is doing much better than a month ago.  Her energy is slowly returning. Her appetite is slowly improving.  She is having regular bms.  No real complaints   Review of Systems     Objective:   Physical Exam Midline incision healed but there are two places where some suture is visible that I clipped off today, nontender, soft    Assessment:     S/p elap with sbr for sbo     Plan:     She is doing well. She can increase her activity to whatever she tolerates. I will plan on seeing her back in about 6 weeks just to make sure her wound is healed and she doesn't need anything else.

## 2013-04-21 ENCOUNTER — Telehealth (INDEPENDENT_AMBULATORY_CARE_PROVIDER_SITE_OTHER): Payer: Self-pay | Admitting: *Deleted

## 2013-04-21 NOTE — Telephone Encounter (Signed)
Spoke with Dr. Dwain Sarna at this time who gave verbal order for [facility] to monitor wound and use the [facility] skin barrier on the area.  Dawn with home health care given verbal order at this time.

## 2013-04-21 NOTE — Telephone Encounter (Signed)
Dawn with Home Health Care called to report patient has an opening wound on her sacral area.  She is asking for orders to monitor this wound and also to use [facility] which is a barrier cream on the area.  Explained that a message will be sent to Dr. Dwain Sarna for approval and then we will call her with verbal orders if approved.  Dawn states understanding and agreeable at this time.

## 2013-05-05 ENCOUNTER — Telehealth (INDEPENDENT_AMBULATORY_CARE_PROVIDER_SITE_OTHER): Payer: Self-pay | Admitting: *Deleted

## 2013-05-05 NOTE — Telephone Encounter (Signed)
Cesar with physical therapy called to state they did an eval on patient and are recommending 2 times per week for 4 weeks to work on gait and balance.  They are asking for an order to be able to do this.  Explained that a message will be sent to Dr. Dwain Sarna and we will let them know his decision, states understanding and agreeable at this time.

## 2013-05-06 NOTE — Telephone Encounter (Signed)
Returned call and gave verbal order that Dr Dwain Sarna ok'd the PT 2xs a week for 4wks to work on the gait and balance.

## 2013-05-26 ENCOUNTER — Telehealth (INDEPENDENT_AMBULATORY_CARE_PROVIDER_SITE_OTHER): Payer: Self-pay

## 2013-05-26 NOTE — Telephone Encounter (Signed)
No notes in encounter. 

## 2013-05-31 ENCOUNTER — Encounter (INDEPENDENT_AMBULATORY_CARE_PROVIDER_SITE_OTHER): Payer: Medicare Other | Admitting: General Surgery

## 2013-06-27 ENCOUNTER — Encounter (INDEPENDENT_AMBULATORY_CARE_PROVIDER_SITE_OTHER): Payer: Medicare Other | Admitting: General Surgery

## 2014-01-07 ENCOUNTER — Emergency Department (HOSPITAL_COMMUNITY): Payer: Medicare Other

## 2014-01-07 ENCOUNTER — Inpatient Hospital Stay (HOSPITAL_COMMUNITY)
Admission: EM | Admit: 2014-01-07 | Discharge: 2014-01-10 | DRG: 480 | Disposition: A | Discharge status: Skilled Nursing Facility | Payer: Medicare Other | Attending: Internal Medicine | Admitting: Internal Medicine

## 2014-01-07 ENCOUNTER — Encounter (HOSPITAL_COMMUNITY): Payer: Self-pay | Admitting: Emergency Medicine

## 2014-01-07 DIAGNOSIS — Z681 Body mass index (BMI) 19 or less, adult: Secondary | ICD-10-CM

## 2014-01-07 DIAGNOSIS — M810 Age-related osteoporosis without current pathological fracture: Secondary | ICD-10-CM | POA: Diagnosis present

## 2014-01-07 DIAGNOSIS — K5900 Constipation, unspecified: Secondary | ICD-10-CM

## 2014-01-07 DIAGNOSIS — S72143A Displaced intertrochanteric fracture of unspecified femur, initial encounter for closed fracture: Principal | ICD-10-CM | POA: Diagnosis present

## 2014-01-07 DIAGNOSIS — I10 Essential (primary) hypertension: Secondary | ICD-10-CM | POA: Diagnosis present

## 2014-01-07 DIAGNOSIS — R002 Palpitations: Secondary | ICD-10-CM

## 2014-01-07 DIAGNOSIS — Z885 Allergy status to narcotic agent status: Secondary | ICD-10-CM

## 2014-01-07 DIAGNOSIS — F0390 Unspecified dementia without behavioral disturbance: Secondary | ICD-10-CM | POA: Diagnosis present

## 2014-01-07 DIAGNOSIS — F32A Depression, unspecified: Secondary | ICD-10-CM

## 2014-01-07 DIAGNOSIS — S72009A Fracture of unspecified part of neck of unspecified femur, initial encounter for closed fracture: Secondary | ICD-10-CM

## 2014-01-07 DIAGNOSIS — IMO0002 Reserved for concepts with insufficient information to code with codable children: Secondary | ICD-10-CM

## 2014-01-07 DIAGNOSIS — F411 Generalized anxiety disorder: Secondary | ICD-10-CM | POA: Diagnosis present

## 2014-01-07 DIAGNOSIS — R5383 Other fatigue: Secondary | ICD-10-CM

## 2014-01-07 DIAGNOSIS — Y93H2 Activity, gardening and landscaping: Secondary | ICD-10-CM

## 2014-01-07 DIAGNOSIS — F329 Major depressive disorder, single episode, unspecified: Secondary | ICD-10-CM | POA: Diagnosis present

## 2014-01-07 DIAGNOSIS — F3289 Other specified depressive episodes: Secondary | ICD-10-CM | POA: Diagnosis present

## 2014-01-07 DIAGNOSIS — S0003XA Contusion of scalp, initial encounter: Secondary | ICD-10-CM

## 2014-01-07 DIAGNOSIS — R5381 Other malaise: Secondary | ICD-10-CM

## 2014-01-07 DIAGNOSIS — S0990XA Unspecified injury of head, initial encounter: Secondary | ICD-10-CM

## 2014-01-07 DIAGNOSIS — K2970 Gastritis, unspecified, without bleeding: Secondary | ICD-10-CM

## 2014-01-07 DIAGNOSIS — Y92009 Unspecified place in unspecified non-institutional (private) residence as the place of occurrence of the external cause: Secondary | ICD-10-CM

## 2014-01-07 DIAGNOSIS — S32009A Unspecified fracture of unspecified lumbar vertebra, initial encounter for closed fracture: Secondary | ICD-10-CM

## 2014-01-07 DIAGNOSIS — R000 Tachycardia, unspecified: Secondary | ICD-10-CM | POA: Diagnosis present

## 2014-01-07 DIAGNOSIS — K219 Gastro-esophageal reflux disease without esophagitis: Secondary | ICD-10-CM | POA: Diagnosis present

## 2014-01-07 DIAGNOSIS — E7800 Pure hypercholesterolemia, unspecified: Secondary | ICD-10-CM | POA: Diagnosis present

## 2014-01-07 DIAGNOSIS — R1032 Left lower quadrant pain: Secondary | ICD-10-CM

## 2014-01-07 DIAGNOSIS — S72002A Fracture of unspecified part of neck of left femur, initial encounter for closed fracture: Secondary | ICD-10-CM

## 2014-01-07 DIAGNOSIS — M545 Low back pain, unspecified: Secondary | ICD-10-CM

## 2014-01-07 DIAGNOSIS — R112 Nausea with vomiting, unspecified: Secondary | ICD-10-CM

## 2014-01-07 DIAGNOSIS — Z8679 Personal history of other diseases of the circulatory system: Secondary | ICD-10-CM

## 2014-01-07 DIAGNOSIS — W010XXA Fall on same level from slipping, tripping and stumbling without subsequent striking against object, initial encounter: Secondary | ICD-10-CM | POA: Diagnosis present

## 2014-01-07 DIAGNOSIS — F29 Unspecified psychosis not due to a substance or known physiological condition: Secondary | ICD-10-CM | POA: Diagnosis not present

## 2014-01-07 DIAGNOSIS — K149 Disease of tongue, unspecified: Secondary | ICD-10-CM

## 2014-01-07 DIAGNOSIS — S0083XA Contusion of other part of head, initial encounter: Secondary | ICD-10-CM

## 2014-01-07 DIAGNOSIS — E43 Unspecified severe protein-calorie malnutrition: Secondary | ICD-10-CM | POA: Diagnosis present

## 2014-01-07 DIAGNOSIS — Z87891 Personal history of nicotine dependence: Secondary | ICD-10-CM

## 2014-01-07 DIAGNOSIS — K2990 Gastroduodenitis, unspecified, without bleeding: Secondary | ICD-10-CM

## 2014-01-07 DIAGNOSIS — Z780 Asymptomatic menopausal state: Secondary | ICD-10-CM

## 2014-01-07 DIAGNOSIS — K5730 Diverticulosis of large intestine without perforation or abscess without bleeding: Secondary | ICD-10-CM | POA: Diagnosis present

## 2014-01-07 DIAGNOSIS — S8010XA Contusion of unspecified lower leg, initial encounter: Secondary | ICD-10-CM

## 2014-01-07 DIAGNOSIS — Z8673 Personal history of transient ischemic attack (TIA), and cerebral infarction without residual deficits: Secondary | ICD-10-CM

## 2014-01-07 DIAGNOSIS — Z808 Family history of malignant neoplasm of other organs or systems: Secondary | ICD-10-CM

## 2014-01-07 DIAGNOSIS — S62109A Fracture of unspecified carpal bone, unspecified wrist, initial encounter for closed fracture: Secondary | ICD-10-CM

## 2014-01-07 DIAGNOSIS — E8941 Symptomatic postprocedural ovarian failure: Secondary | ICD-10-CM

## 2014-01-07 DIAGNOSIS — S1093XA Contusion of unspecified part of neck, initial encounter: Secondary | ICD-10-CM

## 2014-01-07 DIAGNOSIS — R0989 Other specified symptoms and signs involving the circulatory and respiratory systems: Secondary | ICD-10-CM

## 2014-01-07 DIAGNOSIS — R0609 Other forms of dyspnea: Secondary | ICD-10-CM

## 2014-01-07 DIAGNOSIS — I059 Rheumatic mitral valve disease, unspecified: Secondary | ICD-10-CM

## 2014-01-07 DIAGNOSIS — Z8659 Personal history of other mental and behavioral disorders: Secondary | ICD-10-CM

## 2014-01-07 DIAGNOSIS — M129 Arthropathy, unspecified: Secondary | ICD-10-CM | POA: Diagnosis present

## 2014-01-07 DIAGNOSIS — R1013 Epigastric pain: Secondary | ICD-10-CM

## 2014-01-07 DIAGNOSIS — R413 Other amnesia: Secondary | ICD-10-CM

## 2014-01-07 HISTORY — DX: Unspecified dementia, unspecified severity, without behavioral disturbance, psychotic disturbance, mood disturbance, and anxiety: F03.90

## 2014-01-07 LAB — COMPREHENSIVE METABOLIC PANEL
ALK PHOS: 79 U/L (ref 39–117)
ALT: 16 U/L (ref 0–35)
AST: 22 U/L (ref 0–37)
Albumin: 3.6 g/dL (ref 3.5–5.2)
BILIRUBIN TOTAL: 0.5 mg/dL (ref 0.3–1.2)
BUN: 14 mg/dL (ref 6–23)
CHLORIDE: 94 meq/L — AB (ref 96–112)
CO2: 22 mEq/L (ref 19–32)
Calcium: 9.3 mg/dL (ref 8.4–10.5)
Creatinine, Ser: 0.43 mg/dL — ABNORMAL LOW (ref 0.50–1.10)
GFR calc non Af Amer: 85 mL/min — ABNORMAL LOW (ref >90)
GLUCOSE: 105 mg/dL — AB (ref 70–99)
POTASSIUM: 4.2 meq/L (ref 3.7–5.3)
SODIUM: 131 meq/L — AB (ref 137–147)
TOTAL PROTEIN: 7.1 g/dL (ref 6.0–8.3)

## 2014-01-07 LAB — CBC WITH DIFFERENTIAL/PLATELET
Basophils Absolute: 0.1 10*3/uL (ref 0.0–0.1)
Basophils Relative: 1 % (ref 0–1)
EOS ABS: 0.2 10*3/uL (ref 0.0–0.7)
Eosinophils Relative: 2 % (ref 0–5)
HCT: 37.7 % (ref 36.0–46.0)
HEMOGLOBIN: 13.2 g/dL (ref 12.0–15.0)
LYMPHS ABS: 1.8 10*3/uL (ref 0.7–4.0)
LYMPHS PCT: 15 % (ref 12–46)
MCH: 30.3 pg (ref 26.0–34.0)
MCHC: 35 g/dL (ref 30.0–36.0)
MCV: 86.5 fL (ref 78.0–100.0)
Monocytes Absolute: 1 10*3/uL (ref 0.1–1.0)
Monocytes Relative: 9 % (ref 3–12)
NEUTROS ABS: 8.7 10*3/uL — AB (ref 1.7–7.7)
NEUTROS PCT: 73 % (ref 43–77)
PLATELETS: 260 10*3/uL (ref 150–400)
RBC: 4.36 MIL/uL (ref 3.87–5.11)
RDW: 14 % (ref 11.5–15.5)
WBC: 11.7 10*3/uL — AB (ref 4.0–10.5)

## 2014-01-07 LAB — URINE MICROSCOPIC-ADD ON

## 2014-01-07 LAB — TYPE AND SCREEN
ABO/RH(D): A POS
Antibody Screen: NEGATIVE

## 2014-01-07 LAB — URINALYSIS, ROUTINE W REFLEX MICROSCOPIC
Bilirubin Urine: NEGATIVE
GLUCOSE, UA: NEGATIVE mg/dL
Hgb urine dipstick: NEGATIVE
KETONES UR: 15 mg/dL — AB
Nitrite: NEGATIVE
PH: 7.5 (ref 5.0–8.0)
PROTEIN: NEGATIVE mg/dL
Specific Gravity, Urine: 1.016 (ref 1.005–1.030)
Urobilinogen, UA: 0.2 mg/dL (ref 0.0–1.0)

## 2014-01-07 LAB — PROTIME-INR
INR: 1.04 (ref 0.00–1.49)
Prothrombin Time: 13.4 seconds (ref 11.6–15.2)

## 2014-01-07 LAB — APTT: APTT: 34 s (ref 24–37)

## 2014-01-07 MED ORDER — MORPHINE SULFATE 2 MG/ML IJ SOLN: 0.5000 mg | INTRAMUSCULAR | Status: DC | PRN

## 2014-01-07 MED ORDER — ENSURE COMPLETE PO LIQD
237.0000 mL | Freq: Two times a day (BID) | ORAL | Status: DC
  Administered 2014-01-08 – 2014-01-10 (×4): 237 mL via ORAL

## 2014-01-07 MED ORDER — CHLORHEXIDINE GLUCONATE 4 % EX LIQD
60.0000 mL | Freq: Once | CUTANEOUS | Status: DC
  Filled 2014-01-07: qty 60

## 2014-01-07 MED ORDER — ONDANSETRON HCL 4 MG/2ML IJ SOLN: 4.0000 mg | Freq: Three times a day (TID) | INTRAMUSCULAR | Status: AC | PRN

## 2014-01-07 MED ORDER — HYDROMORPHONE HCL PF 1 MG/ML IJ SOLN
0.5000 mg | INTRAMUSCULAR | Status: AC | PRN
  Administered 2014-01-07 (×2): 0.5 mg via INTRAVENOUS
  Filled 2014-01-07 (×2): qty 1

## 2014-01-07 MED ORDER — HYDROCODONE-ACETAMINOPHEN 5-325 MG PO TABS
1.0000 | ORAL_TABLET | ORAL | Status: DC | PRN
  Filled 2014-01-07: qty 2

## 2014-01-07 MED ORDER — FLUOXETINE HCL 20 MG PO CAPS
20.0000 mg | ORAL_CAPSULE | Freq: Every day | ORAL | Status: DC
  Administered 2014-01-09 – 2014-01-10 (×2): 20 mg via ORAL
  Filled 2014-01-07 (×3): qty 1

## 2014-01-07 MED ORDER — HYDROMORPHONE HCL PF 1 MG/ML IJ SOLN
0.5000 mg | INTRAMUSCULAR | Status: DC | PRN
  Administered 2014-01-08 – 2014-01-09 (×2): 0.5 mg via INTRAVENOUS
  Filled 2014-01-07 (×2): qty 1

## 2014-01-07 MED ORDER — DOCUSATE SODIUM 100 MG PO CAPS
100.0000 mg | ORAL_CAPSULE | Freq: Two times a day (BID) | ORAL | Status: DC
  Administered 2014-01-07 – 2014-01-10 (×5): 100 mg via ORAL
  Filled 2014-01-07 (×7): qty 1

## 2014-01-07 MED ORDER — CEFAZOLIN SODIUM-DEXTROSE 2-3 GM-% IV SOLR
2.0000 g | INTRAVENOUS | Status: AC
  Administered 2014-01-08: 2 g via INTRAVENOUS
  Filled 2014-01-07: qty 50

## 2014-01-07 MED ORDER — LABETALOL HCL 5 MG/ML IV SOLN
10.0000 mg | Freq: Four times a day (QID) | INTRAVENOUS | Status: DC | PRN
  Administered 2014-01-08: 21:00:00 via INTRAVENOUS
  Filled 2014-01-07: qty 4

## 2014-01-07 MED ORDER — PANTOPRAZOLE SODIUM 40 MG PO TBEC
40.0000 mg | DELAYED_RELEASE_TABLET | Freq: Every day | ORAL | Status: DC
  Administered 2014-01-09 – 2014-01-10 (×2): 40 mg via ORAL
  Filled 2014-01-07 (×3): qty 1

## 2014-01-07 MED ORDER — LORAZEPAM 0.5 MG PO TABS
0.5000 mg | ORAL_TABLET | Freq: Three times a day (TID) | ORAL | Status: DC | PRN
  Filled 2014-01-07: qty 1

## 2014-01-07 MED ORDER — BISACODYL 5 MG PO TBEC: 5.0000 mg | DELAYED_RELEASE_TABLET | Freq: Every day | ORAL | Status: DC | PRN

## 2014-01-07 MED ORDER — HYDROCODONE-ACETAMINOPHEN 5-325 MG PO TABS
1.0000 | ORAL_TABLET | Freq: Four times a day (QID) | ORAL | Status: DC | PRN
  Administered 2014-01-07: 1 via ORAL
  Administered 2014-01-08: 2 via ORAL
  Administered 2014-01-08 – 2014-01-10 (×5): 1 via ORAL
  Administered 2014-01-10: 2 via ORAL
  Filled 2014-01-07 (×2): qty 1
  Filled 2014-01-07 (×2): qty 2
  Filled 2014-01-07: qty 1
  Filled 2014-01-07: qty 2
  Filled 2014-01-07 (×2): qty 1

## 2014-01-07 NOTE — ED Provider Notes (Signed)
CSN: [id number]     Arrival date & time 01/07/14  1841 History   First MD Initiated Contact with Patient 01/07/14 1852     Chief Complaint  Patient presents with  . Fall  . Hip Pain     (Consider location/radiation/quality/duration/timing/severity/associated sxs/prior Treatment) HPI 80 year old female who fell today while doing yard work and has pain in her left hip. She denies any other injuries. She states the pain is severe and points to the left hip area. She denies any numbness or tingling distal to the injury. She denies any other injury. She did not lose consciousness. Past Medical History  Diagnosis Date  . Diverticulosis     universal,severe  . Family history of GERD   . Osteoporosis   . Transient ischemic attack   . Fracture     leg wrist  . High cholesterol   . Depression   . Gastritis   . Arthritis    Past Surgical History  Procedure Laterality Date  . Abdominal hysterectomy    . Laparotomy  1960    SBO  . Ovarian cyst removal    . Tonsillectomy    . Appendectomy    . Abdominal adhesion surgery  1993    SBO   . Laparotomy N/A 02/09/2013    Procedure: EXPLORATORY LAPAROTOMY;  Surgeon: Emelia LoronMatthew Wakefield, MD;  [facility];  Service: General;  Laterality: N/A;  . Colon resection N/A 02/09/2013    Procedure: COLON RESECTION;  Surgeon: Emelia LoronMatthew Wakefield, MD;  [facility];  Service: General;  Laterality: N/A;   Family History  Problem Relation Age of Onset  . Stroke    . Colon cancer    . Heart disease      aunt  . Melanoma Mother   . Cancer Mother     melenoma  . Tuberculosis Father    History  Substance Use Topics  . Smoking status: Former Games developermoker  . Smokeless tobacco: Never Used  . Alcohol Use: No   OB History   Grav Para Term Preterm Abortions TAB SAB Ect Mult Living                 Review of Systems  All other systems reviewed and are negative.     Allergies  Codeine; Indomethacin; and Morphine and related  Home Medications    Prior to Admission medications   Medication Sig Start Date End Date Taking? Authorizing Provider  acetaminophen (TYLENOL) 325 MG tablet Take 2 tablets (650 mg total) by mouth every 6 (six) hours as needed. 02/17/13   Doristine MangoElizabeth White, PA-C  docusate sodium 100 MG CAPS Take 100 mg by mouth 2 (two) times daily. 02/17/13   Doristine MangoElizabeth White, PA-C  feeding supplement (ENSURE COMPLETE) LIQD Take 237 mLs by mouth 2 (two) times daily between meals. 02/17/13   Doristine MangoElizabeth White, PA-C  fluocinonide (LIDEX) 0.05 % cream Apply 1 application topically daily.      Historical Provider, MD  FLUoxetine (PROZAC) 20 MG capsule Take 1 capsule (20 mg total) by mouth daily. 02/17/13   Doristine MangoElizabeth White, PA-C  HYDROcodone-acetaminophen (NORCO/VICODIN) 5-325 MG per tablet Take 1 tablet by mouth every 6 (six) hours as needed for pain. 02/17/13   Doristine MangoElizabeth White, PA-C  pantoprazole (PROTONIX) 40 MG tablet Take 1 tablet (40 mg total) by mouth daily. 02/17/13   Doristine MangoElizabeth White, PA-C   SpO2 95% Physical Exam  Nursing note and vitals reviewed. Constitutional: She is oriented to person, place, and time. She appears well-developed and well-nourished.  HENT:  Head: Normocephalic and atraumatic.  Right Ear: External ear normal.  Left Ear: External ear normal.  Mouth/Throat: Oropharynx is clear and moist.  Eyes: Conjunctivae and EOM are normal. Pupils are equal, round, and reactive to light.  Neck: Normal range of motion. Neck supple.  Cardiovascular: Normal rate, regular rhythm, normal heart sounds and intact distal pulses.   Pulmonary/Chest: Effort normal and breath sounds normal.  Abdominal: Soft. Bowel sounds are normal.  Musculoskeletal:  Left hip ttp,internal rotation, skin intact  Neurological: She is alert and oriented to person, place, and time. She has normal reflexes.  Skin: Skin is warm and dry.  Psychiatric: She has a normal mood and affect. Her behavior is normal.    ED Course  Procedures (including critical care  time) Labs Review Labs Reviewed  URINE CULTURE  APTT  CBC WITH DIFFERENTIAL  COMPREHENSIVE METABOLIC PANEL  PROTIME-INR  URINALYSIS, ROUTINE W REFLEX MICROSCOPIC  TYPE AND SCREEN    Imaging Review No results found.   EKG Interpretation   Date/Time:  Saturday January 07 2014 20:00:52 EDT Ventricular Rate:  101 PR Interval:  197 QRS Duration: 79 QT Interval:  346 QTC Calculation: 448 R Axis:   -37 Text Interpretation:  Sinus tachycardia Probable left atrial enlargement  Left axis deviation Low voltage, extremity leads Borderline repolarization  abnormality Confirmed by Harlyn Rathmann MD, Duwayne HeckANIELLE ([id number]) on 01/07/2014 8:09:26 PM      MDM   Final diagnoses:  None    Discussed with Dr. Lajoyce Cornersuda and plans surgery in a.m.- npo after mn.  Hospitalist paged for admission.   Discussed with Dr. Isidoro Donningai and she will see patient.  Plan admission to ortho bed.  Patient with some hypertension here on initial evaluation sbp 190, bp decreased to 160 after pain medicine.   Mild tachycardia at 100 also consistent with pain response.   1- left hip fracture 2- fall 3- hypertension and tachycardia likely secondary to 1   Hilario Quarryanielle S Gaje Tennyson, MD 01/07/14 2023

## 2014-01-07 NOTE — ED Notes (Signed)
The patient is unable to give urine specimen at this time. The patient has been advised to use call light for assistance to the restroom. The tech has reported to the RN in charge. 

## 2014-01-07 NOTE — ED Notes (Addendum)
Pt reports to the ED via [facility] following a fall. Per pt she pulled on a branch . Pt was found laying on her left side. Complaining of severe pain in the left hip. She received 50 mcgs of Fentanyl she was able to be rolled on her back. Sheet placed around hips to hold pelvis stable. Pt received another 50 mcgs which decreased her pain from 10/10 to a 6/10. Pt has hx of dementia. Pts daughter reports she seems more confused than normal. CMS intact. Denies any syncope, head injury, or LOC. Also denies any neck or back pain. Denies any SOB or N/V. Pt hypertensive and tachycardic en route. Pt alert and moaning in pain. Last PO intake was approx 12:00.

## 2014-01-07 NOTE — H&P (Signed)
History and Physical       Hospital Admission Note Date: 01/07/2014  Patient name: Ashley Burnett Medical record number: [id number] Date of birth: 1918/04/02 Age: 80 y.o. Gender: female  PCP: Loreen Freud, DO    Chief Complaint:  Left hip pain and fracture  HPI: Patient is a 80 year old female who is otherwise in good physical health, ambulates by herself, was doing yard work and holding on to a tree branch and then that branch broke and patient fell. Patient denied any syncopal episode or losing any consciousness. She fell on her left hip and started having pain in the left hip area. She denied any other injury. Hip x-ray showed minimally displaced acute intertrochanteric left femur fracture  Review of Systems:  Constitutional: Denies fever, chills, diaphoresis, poor appetite and fatigue.  HEENT: Denies photophobia, eye pain, redness, hearing loss, ear pain, congestion, sore throat, rhinorrhea, sneezing, mouth sores, trouble swallowing, neck pain, neck stiffness and tinnitus.   Respiratory: Denies SOB, DOE, cough, chest tightness,  and wheezing.   Cardiovascular: Denies chest pain, palpitations and leg swelling.  Gastrointestinal: Denies nausea, vomiting, abdominal pain, diarrhea, constipation, blood in stool and abdominal distention.  Genitourinary: Denies dysuria, urgency, frequency, hematuria, flank pain and difficulty urinating.  Musculoskeletal: Please see history of present illness  Skin: Denies pallor, rash and wound.  Neurological: Denies dizziness, seizures, syncope, weakness, light-headedness, numbness and headaches.  Hematological: Denies adenopathy. Easy bruising, personal or family bleeding history  Psychiatric/Behavioral: Denies suicidal ideation, mood changes, confusion, nervousness, sleep disturbance and agitation  Past Medical History: Past Medical History  Diagnosis Date  . Diverticulosis     universal,severe  .  Family history of GERD   . Osteoporosis   . Transient ischemic attack   . Fracture     leg wrist  . High cholesterol   . Depression   . Gastritis   . Arthritis    Past Surgical History  Procedure Laterality Date  . Abdominal hysterectomy    . Laparotomy  1960    [facility]  . Ovarian cyst removal    . Tonsillectomy    . Appendectomy    . Abdominal adhesion surgery  1993    [facility]   . Laparotomy N/A 02/09/2013    Procedure: EXPLORATORY LAPAROTOMY;  Surgeon: Emelia Loron, MD;  Location: [facility] OR;  Service: General;  Laterality: N/A;  . Colon resection N/A 02/09/2013    Procedure: COLON RESECTION;  Surgeon: Emelia Loron, MD;  Location: [facility] OR;  Service: General;  Laterality: N/A;    Medications: Prior to Admission medications   Medication Sig Start Date End Date Taking? Authorizing Provider  acetaminophen (TYLENOL) 325 MG tablet Take 2 tablets (650 mg total) by mouth every 6 (six) hours as needed. 02/17/13  Yes Doristine Mango, PA-C  docusate sodium 100 MG CAPS Take 100 mg by mouth 2 (two) times daily. 02/17/13  Yes Doristine Mango, PA-C  feeding supplement (ENSURE COMPLETE) LIQD Take 237 mLs by mouth 2 (two) times daily between meals. 02/17/13  Yes Elizabeth White, PA-C  fluocinonide (LIDEX) 0.05 % cream Apply 1 application topically daily.     Yes Historical Provider, MD  FLUoxetine (PROZAC) 20 MG capsule Take 1 capsule (20 mg total) by mouth daily. 02/17/13  Yes Doristine Mango, PA-C  pantoprazole (PROTONIX) 40 MG tablet Take 1 tablet (40 mg total) by mouth daily. 02/17/13  Yes Doristine Mango, PA-C    Allergies:   Allergies  Allergen Reactions  . Codeine     unknown  .  Indomethacin     Unknown. Spoke to granddaughter on 02/05/13 who stated she was not aware of this allergy and that her grandmother (Ms. Sampath) does not remember this allergy either.   . Morphine And Related     unknown    Social History:  reports that she has quit smoking. She has never used smokeless tobacco.  She reports that she does not drink alcohol or use illicit drugs.  Family History: Family History  Problem Relation Age of Onset  . Stroke    . Colon cancer    . Heart disease      aunt  . Melanoma Mother   . Cancer Mother     melenoma  . Tuberculosis Father     Physical Exam: Blood pressure 167/87, pulse 108, temperature 98.7 F (37.1 C), resp. rate 16, SpO2 93.00%. General: Alert, awake, oriented x3, in no acute distress. HEENT: normocephalic, atraumatic, anicteric sclera, pink conjunctiva, pupils equal and reactive to light and accomodation, oropharynx clear Neck: supple, no masses or lymphadenopathy, no goiter, no bruits  Heart: Regular rate and rhythm, without murmurs, rubs or gallops. Lungs: Clear to auscultation bilaterally, no wheezing, rales or rhonchi. Abdomen: Soft, nontender, nondistended, positive bowel sounds, no masses. Extremities: No clubbing, cyanosis or edema with positive pedal pulses. Left hip tenderness to palpation, internal rotation no abrasions or skin breakdown Neuro: Grossly intact, no focal neurological deficits, Psych: alert and oriented x 3, normal mood and affect Skin: no rashes or lesions, warm and dry   LABS on Admission:  Basic Metabolic Panel:  Recent Labs Lab 01/07/14 1943  NA 131*  K 4.2  CL 94*  CO2 22  GLUCOSE 105*  BUN 14  CREATININE 0.43*  CALCIUM 9.3   Liver Function Tests:  Recent Labs Lab 01/07/14 1943  AST 22  ALT 16  ALKPHOS 79  BILITOT 0.5  PROT 7.1  ALBUMIN 3.6   No results found for this basename: LIPASE, AMYLASE,  in the last 168 hours No results found for this basename: AMMONIA,  in the last 168 hours CBC:  Recent Labs Lab 01/07/14 1943  WBC 11.7*  NEUTROABS 8.7*  HGB 13.2  HCT 37.7  MCV 86.5  PLT 260   Cardiac Enzymes: No results found for this basename: CKTOTAL, CKMB, CKMBINDEX, TROPONINI,  in the last 168 hours BNP: No components found with this basename: POCBNP,  CBG: No results found  for this basename: GLUCAP,  in the last 168 hours   Radiological Exams on Admission: Dg Chest 1 View  01/07/2014   CLINICAL DATA:  Pre-op respiratory exam for hip fracture.  EXAM: CHEST - 1 VIEW  COMPARISON:  02/04/2013  FINDINGS: Pulmonary hyperinflation again seen, consistent with COPD. Right lower lung scarring noted. No evidence of pulmonary infiltrate or pleural effusion. Heart size is within normal limits. No mass or lymphadenopathy identified.  IMPRESSION: COPD.  No active disease.   Electronically Signed   By: Myles RosenthalJohn  Stahl M.D.   On: 01/07/2014 19:49   Dg Hip Complete Left  01/07/2014   CLINICAL DATA:  80 year old female status post fall with pain. Initial encounter.  EXAM: LEFT HIP - COMPLETE 2+ VIEW  COMPARISON:  None.  FINDINGS: Lucency through the intertrochanteric region of the proximal left femur consistent with fracture. Left femoral head remains normally located.  Osteopenia. Aortoiliac calcified atherosclerosis noted. No acute fracture of the pelvis identified. Grossly intact proximal right femur.  IMPRESSION: Acute intertrochanteric left femur fracture, minimally displaced.   Electronically Signed  By: [name]   On: 01/07/2014 19:49    Assessment/Plan Principal Problem:   Hip fracture, left - Secondary to mechanical fall, admit to MedSurg - Dr. Lajoyce Cornersuda has been consulted, surgery in a.m. - Will keep n.p.o., SCDs, pain control   - Patient is otherwise medically cleared for the surgery  Active Problems:   ANXIETY STATE NOS -Placed on Ativan as needed, continue Prozac     GERD - Continue PPI     OSTEOPOROSIS - Obtain vitamin D level, continue nutritional supplements     HypertensionAnd tachycardia  - Likely due to pain in the anxiety, placed on labetalol as needed. She otherwise is not on any antihypertensives.   DVT prophylaxis:  SCDs for now, start on Lovenox after surgery   CODE STATUS: Full code   Family Communication: Admission, patients condition and plan  of care including tests being ordered have been discussed with the patient, daughter and grand daughter  who indicates understanding and agree with the plan and Code Status   Further plan will depend as patient's clinical course evolves and further radiologic and laboratory data become available.   Time Spent on Admission: 1 hour   Reonna Finlayson Jenna LuoK Sarahanne Novakowski M.D. [facility] 01/07/2014, 9:19 PM Pager: [phone]  If 7PM-7AM, please contact night-coverage [email or web address] Password [email or web address]  **Disclaimer: This note was dictated with voice recognition software. Similar sounding words can inadvertently be transcribed and this note may contain transcription errors which may not have been corrected upon publication of note.**

## 2014-01-07 NOTE — Consult Note (Signed)
Reason for Consult: Left hip intertrochanteric hip fracture Referring Physician: Dr. Jeanell Burnett  Ashley Burnett is an 80 y.o. female.  HPI: Patient patient is a 80 year old woman who was out trying to trim some branches on a tray when she fell mechanical fall landing on her left hip. Patient denies any other injuries. Patient does have some memory issues.  Past Medical History  Diagnosis Date  . Diverticulosis     universal,severe  . Family history of GERD   . Osteoporosis   . Transient ischemic attack   . Fracture     leg wrist  . High cholesterol   . Depression   . Gastritis   . Arthritis     Past Surgical History  Procedure Laterality Date  . Abdominal hysterectomy    . Laparotomy  1960    [facility]  . Ovarian cyst removal    . Tonsillectomy    . Appendectomy    . Abdominal adhesion surgery  1993    [facility]   . Laparotomy N/A 02/09/2013    Procedure: EXPLORATORY LAPAROTOMY;  Surgeon: Rolm Bookbinder, MD;  Location: [facility];  Service: General;  Laterality: N/A;  . Colon resection N/A 02/09/2013    Procedure: COLON RESECTION;  Surgeon: Rolm Bookbinder, MD;  Location: [facility] OR;  Service: General;  Laterality: N/A;    Family History  Problem Relation Age of Onset  . Stroke    . Colon cancer    . Heart disease      aunt  . Melanoma Mother   . Cancer Mother     melenoma  . Tuberculosis Father     Social History:  reports that she has quit smoking. She has never used smokeless tobacco. She reports that she does not drink alcohol or use illicit drugs.  Allergies:  Allergies  Allergen Reactions  . Codeine     unknown  . Indomethacin     Unknown. Spoke to granddaughter on 02/05/13 who stated she was not aware of this allergy and that her grandmother (Ashley Burnett) does not remember this allergy either.   . Morphine And Related     unknown    Medications: I have reviewed the patient's current medications.  Results for orders placed during the hospital encounter of 01/07/14 (from the past  48 hour(s))  APTT     Status: None   Collection Time    01/07/14  7:43 PM      Result Value Ref Range   aPTT 34  24 - 37 seconds  CBC WITH DIFFERENTIAL     Status: Abnormal   Collection Time    01/07/14  7:43 PM      Result Value Ref Range   WBC 11.7 (*) 4.0 - 10.5 K/uL   RBC 4.36  3.87 - 5.11 MIL/uL   Hemoglobin 13.2  12.0 - 15.0 g/dL   HCT 37.7  36.0 - 46.0 %   MCV 86.5  78.0 - 100.0 fL   MCH 30.3  26.0 - 34.0 pg   MCHC 35.0  30.0 - 36.0 g/dL   RDW 14.0  11.5 - 15.5 %   Platelets 260  150 - 400 K/uL   Neutrophils Relative % 73  43 - 77 %   Neutro Abs 8.7 (*) 1.7 - 7.7 K/uL   Lymphocytes Relative 15  12 - 46 %   Lymphs Abs 1.8  0.7 - 4.0 K/uL   Monocytes Relative 9  3 - 12 %   Monocytes Absolute 1.0  0.1 -  1.0 K/uL   Eosinophils Relative 2  0 - 5 %   Eosinophils Absolute 0.2  0.0 - 0.7 K/uL   Basophils Relative 1  0 - 1 %   Basophils Absolute 0.1  0.0 - 0.1 K/uL  COMPREHENSIVE METABOLIC PANEL     Status: Abnormal   Collection Time    01/07/14  7:43 PM      Result Value Ref Range   Sodium 131 (*) 137 - 147 mEq/L   Potassium 4.2  3.7 - 5.3 mEq/L   Chloride 94 (*) 96 - 112 mEq/L   CO2 22  19 - 32 mEq/L   Glucose, Bld 105 (*) 70 - 99 mg/dL   BUN 14  6 - 23 mg/dL   Creatinine, Ser 0.43 (*) 0.50 - 1.10 mg/dL   Calcium 9.3  8.4 - 10.5 mg/dL   Total Protein 7.1  6.0 - 8.3 g/dL   Albumin 3.6  3.5 - 5.2 g/dL   AST 22  0 - 37 U/L   ALT 16  0 - 35 U/L   Alkaline Phosphatase 79  39 - 117 U/L   Total Bilirubin 0.5  0.3 - 1.2 mg/dL   GFR calc non Af Amer 85 (*) >90 mL/min   GFR calc Af Amer >90  >90 mL/min   Comment: (NOTE)     The eGFR has been calculated using the CKD EPI equation.     This calculation has not been validated in all clinical situations.     eGFR's persistently <90 mL/min signify possible Chronic Kidney     Disease.  PROTIME-INR     Status: None   Collection Time    01/07/14  7:43 PM      Result Value Ref Range   Prothrombin Time 13.4  11.6 - 15.2 seconds    INR 1.04  0.00 - 1.49  TYPE AND SCREEN     Status: None   Collection Time    01/07/14  8:20 PM      Result Value Ref Range   ABO/RH(D) A POS     Antibody Screen NEG     Sample Expiration 01/10/2014      Dg Chest 1 View  01/07/2014   CLINICAL DATA:  Pre-op respiratory exam for hip fracture.  EXAM: CHEST - 1 VIEW  COMPARISON:  02/04/2013  FINDINGS: Pulmonary hyperinflation again seen, consistent with COPD. Right lower lung scarring noted. No evidence of pulmonary infiltrate or pleural effusion. Heart size is within normal limits. No mass or lymphadenopathy identified.  IMPRESSION: COPD.  No active disease.   Electronically Signed   By: Earle Gell M.D.   On: 01/07/2014 19:49   Dg Hip Complete Left  01/07/2014   CLINICAL DATA:  80 year old female status post fall with pain. Initial encounter.  EXAM: LEFT HIP - COMPLETE 2+ VIEW  COMPARISON:  None.  FINDINGS: Lucency through the intertrochanteric region of the proximal left femur consistent with fracture. Left femoral head remains normally located.  Osteopenia. Aortoiliac calcified atherosclerosis noted. No acute fracture of the pelvis identified. Grossly intact proximal right femur.  IMPRESSION: Acute intertrochanteric left femur fracture, minimally displaced.   Electronically Signed   By: Lars Pinks M.D.   On: 01/07/2014 19:49    Review of Systems  All other systems reviewed and are negative.  Blood pressure 167/87, pulse 108, temperature 98.7 F (37.1 C), resp. rate 16, SpO2 93.00%. Physical Exam On examination patient has a strong dorsalis pedis pulse. Her left leg is  shortened and externally rotated. 2 view radiographs of the left hip shows an intertrochanteric hip fracture. Assessment/Plan: Assessment: Left intertrochanteric hip fracture.  Plan: Will plan for intramedullary nail fixation of the left hip. Risks and benefits were discussed with the patient's daughter who is the power of attorney including infection neurovascular injury  pain failure of internal fixation DVT need for additional surgery. Patient power of attorney  states she understands and wished to proceed at this time.  Ashley Burnett 01/07/2014, 9:26 PM

## 2014-01-08 ENCOUNTER — Encounter (HOSPITAL_COMMUNITY): Payer: Self-pay | Admitting: Certified Registered"

## 2014-01-08 ENCOUNTER — Inpatient Hospital Stay (HOSPITAL_COMMUNITY): Payer: Medicare Other

## 2014-01-08 ENCOUNTER — Encounter (HOSPITAL_COMMUNITY): Payer: Medicare Other | Admitting: Certified Registered Nurse Anesthetist

## 2014-01-08 ENCOUNTER — Inpatient Hospital Stay (HOSPITAL_COMMUNITY): Payer: Medicare Other | Admitting: Certified Registered Nurse Anesthetist

## 2014-01-08 ENCOUNTER — Encounter (HOSPITAL_COMMUNITY)
Admission: EM | Disposition: A | Discharge status: Skilled Nursing Facility | Payer: Self-pay | Source: Home / Self Care | Attending: Internal Medicine

## 2014-01-08 HISTORY — PX: INTRAMEDULLARY (IM) NAIL INTERTROCHANTERIC: SHX5875

## 2014-01-08 LAB — SURGICAL PCR SCREEN
MRSA, PCR: NEGATIVE
STAPHYLOCOCCUS AUREUS: POSITIVE — AB

## 2014-01-08 LAB — BASIC METABOLIC PANEL
BUN: 11 mg/dL (ref 6–23)
CHLORIDE: 97 meq/L (ref 96–112)
CO2: 21 mEq/L (ref 19–32)
Calcium: 8.9 mg/dL (ref 8.4–10.5)
Creatinine, Ser: 0.39 mg/dL — ABNORMAL LOW (ref 0.50–1.10)
GFR calc Af Amer: >90 mL/min (ref >90)
GFR calc non Af Amer: 88 mL/min — ABNORMAL LOW (ref >90)
Glucose, Bld: 123 mg/dL — ABNORMAL HIGH (ref 70–99)
Potassium: 4.2 mEq/L (ref 3.7–5.3)
Sodium: 133 mEq/L — ABNORMAL LOW (ref 137–147)

## 2014-01-08 LAB — CBC
HCT: 37.9 % (ref 36.0–46.0)
Hemoglobin: 12.9 g/dL (ref 12.0–15.0)
MCH: 29.7 pg (ref 26.0–34.0)
MCHC: 34 g/dL (ref 30.0–36.0)
MCV: 87.3 fL (ref 78.0–100.0)
PLATELETS: 241 10*3/uL (ref 150–400)
RBC: 4.34 MIL/uL (ref 3.87–5.11)
RDW: 14.1 % (ref 11.5–15.5)
WBC: 10 10*3/uL (ref 4.0–10.5)

## 2014-01-08 SURGERY — FIXATION, FRACTURE, INTERTROCHANTERIC, WITH INTRAMEDULLARY ROD
Anesthesia: General | Site: Hip | Laterality: Left

## 2014-01-08 MED ORDER — METOCLOPRAMIDE HCL 5 MG/ML IJ SOLN: 5.0000 mg | Freq: Three times a day (TID) | INTRAMUSCULAR | Status: DC | PRN

## 2014-01-08 MED ORDER — SUCCINYLCHOLINE CHLORIDE 20 MG/ML IJ SOLN
INTRAMUSCULAR | Status: AC
  Filled 2014-01-08: qty 1

## 2014-01-08 MED ORDER — HYDROMORPHONE HCL PF 1 MG/ML IJ SOLN
0.2500 mg | INTRAMUSCULAR | Status: DC | PRN
  Administered 2014-01-08: 0.5 mg via INTRAVENOUS
  Administered 2014-01-08: 0.25 mg via INTRAVENOUS

## 2014-01-08 MED ORDER — SUCCINYLCHOLINE CHLORIDE 20 MG/ML IJ SOLN
INTRAMUSCULAR | Status: DC | PRN
  Administered 2014-01-08: 140 mg via INTRAVENOUS

## 2014-01-08 MED ORDER — LACTATED RINGERS IV SOLN
INTRAVENOUS | Status: DC | PRN
  Administered 2014-01-08: 09:00:00 via INTRAVENOUS

## 2014-01-08 MED ORDER — ONDANSETRON HCL 4 MG PO TABS: 4.0000 mg | ORAL_TABLET | Freq: Four times a day (QID) | ORAL | Status: DC | PRN

## 2014-01-08 MED ORDER — 0.9 % SODIUM CHLORIDE (POUR BTL) OPTIME
TOPICAL | Status: DC | PRN
  Administered 2014-01-08: 1000 mL

## 2014-01-08 MED ORDER — ACETAMINOPHEN 325 MG PO TABS: 650.0000 mg | ORAL_TABLET | Freq: Four times a day (QID) | ORAL | Status: DC | PRN

## 2014-01-08 MED ORDER — EPHEDRINE SULFATE 50 MG/ML IJ SOLN
INTRAMUSCULAR | Status: AC
  Filled 2014-01-08: qty 1

## 2014-01-08 MED ORDER — PROPOFOL 10 MG/ML IV BOLUS
INTRAVENOUS | Status: DC | PRN
  Administered 2014-01-08: 80 mg via INTRAVENOUS

## 2014-01-08 MED ORDER — LIDOCAINE HCL (CARDIAC) 20 MG/ML IV SOLN
INTRAVENOUS | Status: DC | PRN
Start: 2014-01-08 — End: 2014-01-08
  Administered 2014-01-08: 100 mg via INTRAVENOUS

## 2014-01-08 MED ORDER — ONDANSETRON HCL 4 MG/2ML IJ SOLN: 4.0000 mg | Freq: Once | INTRAMUSCULAR | Status: DC | PRN

## 2014-01-08 MED ORDER — SODIUM CHLORIDE 0.9 % IJ SOLN
INTRAMUSCULAR | Status: AC
  Filled 2014-01-08: qty 10

## 2014-01-08 MED ORDER — SODIUM CHLORIDE 0.9 % IV SOLN
INTRAVENOUS | Status: DC
  Administered 2014-01-08: 12:00:00 via INTRAVENOUS

## 2014-01-08 MED ORDER — ACETAMINOPHEN 650 MG RE SUPP: 650.0000 mg | Freq: Four times a day (QID) | RECTAL | Status: DC | PRN

## 2014-01-08 MED ORDER — PHENOL 1.4 % MT LIQD: 1.0000 | OROMUCOSAL | Status: DC | PRN

## 2014-01-08 MED ORDER — MORPHINE SULFATE 2 MG/ML IJ SOLN: 0.5000 mg | INTRAMUSCULAR | Status: DC | PRN

## 2014-01-08 MED ORDER — FENTANYL CITRATE 0.05 MG/ML IJ SOLN
INTRAMUSCULAR | Status: AC
  Filled 2014-01-08: qty 5

## 2014-01-08 MED ORDER — ONDANSETRON HCL 4 MG/2ML IJ SOLN: 4.0000 mg | Freq: Four times a day (QID) | INTRAMUSCULAR | Status: DC | PRN

## 2014-01-08 MED ORDER — FENTANYL CITRATE 0.05 MG/ML IJ SOLN
INTRAMUSCULAR | Status: DC | PRN
  Administered 2014-01-08: 50 ug via INTRAVENOUS
  Administered 2014-01-08: 100 ug via INTRAVENOUS

## 2014-01-08 MED ORDER — PHENYLEPHRINE HCL 10 MG/ML IJ SOLN
INTRAMUSCULAR | Status: DC | PRN
  Administered 2014-01-08: 80 ug via INTRAVENOUS
  Administered 2014-01-08: 160 ug via INTRAVENOUS
  Administered 2014-01-08: 80 ug via INTRAVENOUS

## 2014-01-08 MED ORDER — PHENYLEPHRINE 40 MCG/ML (10ML) SYRINGE FOR IV PUSH (FOR BLOOD PRESSURE SUPPORT)
PREFILLED_SYRINGE | INTRAVENOUS | Status: AC
  Filled 2014-01-08: qty 10

## 2014-01-08 MED ORDER — ASPIRIN EC 325 MG PO TBEC: 325.0000 mg | DELAYED_RELEASE_TABLET | Freq: Every day | ORAL | Status: DC

## 2014-01-08 MED ORDER — PROPOFOL 10 MG/ML IV BOLUS
INTRAVENOUS | Status: AC
  Filled 2014-01-08: qty 20

## 2014-01-08 MED ORDER — EPHEDRINE SULFATE 50 MG/ML IJ SOLN
INTRAMUSCULAR | Status: DC | PRN
  Administered 2014-01-08: 10 mg via INTRAVENOUS

## 2014-01-08 MED ORDER — LIDOCAINE HCL (CARDIAC) 20 MG/ML IV SOLN
INTRAVENOUS | Status: AC
  Filled 2014-01-08: qty 5

## 2014-01-08 MED ORDER — ACETAMINOPHEN 500 MG PO TABS: 500.0000 mg | ORAL_TABLET | Freq: Four times a day (QID) | ORAL | Status: DC | PRN

## 2014-01-08 MED ORDER — HYDROCODONE-ACETAMINOPHEN 5-325 MG PO TABS: 1.0000 | ORAL_TABLET | Freq: Four times a day (QID) | ORAL | Status: DC | PRN

## 2014-01-08 MED ORDER — OXYCODONE HCL 5 MG PO TABS: 5.0000 mg | ORAL_TABLET | Freq: Once | ORAL | Status: DC | PRN

## 2014-01-08 MED ORDER — CEFAZOLIN SODIUM-DEXTROSE 2-3 GM-% IV SOLR
2.0000 g | Freq: Four times a day (QID) | INTRAVENOUS | Status: AC
  Administered 2014-01-08 (×2): 2 g via INTRAVENOUS
  Filled 2014-01-08 (×2): qty 50

## 2014-01-08 MED ORDER — MEPERIDINE HCL 25 MG/ML IJ SOLN: 6.2500 mg | INTRAMUSCULAR | Status: DC | PRN

## 2014-01-08 MED ORDER — MENTHOL 3 MG MT LOZG: 1.0000 | LOZENGE | OROMUCOSAL | Status: DC | PRN

## 2014-01-08 MED ORDER — ASPIRIN EC 325 MG PO TBEC
325.0000 mg | DELAYED_RELEASE_TABLET | Freq: Every day | ORAL | Status: DC
  Administered 2014-01-09 – 2014-01-10 (×2): 325 mg via ORAL
  Filled 2014-01-08 (×3): qty 1

## 2014-01-08 MED ORDER — HYDROMORPHONE HCL PF 1 MG/ML IJ SOLN
INTRAMUSCULAR | Status: AC
  Filled 2014-01-08: qty 1

## 2014-01-08 MED ORDER — MUPIROCIN 2 % EX OINT
1.0000 "application " | TOPICAL_OINTMENT | Freq: Two times a day (BID) | CUTANEOUS | Status: DC
  Administered 2014-01-08 – 2014-01-10 (×5): 1 via NASAL
  Filled 2014-01-08: qty 22

## 2014-01-08 MED ORDER — OXYCODONE HCL 5 MG/5ML PO SOLN: 5.0000 mg | Freq: Once | ORAL | Status: DC | PRN

## 2014-01-08 MED ORDER — METOCLOPRAMIDE HCL 10 MG PO TABS: 5.0000 mg | ORAL_TABLET | Freq: Three times a day (TID) | ORAL | Status: DC | PRN

## 2014-01-08 MED ORDER — MUPIROCIN 2 % EX OINT: TOPICAL_OINTMENT | Freq: Two times a day (BID) | CUTANEOUS | Status: DC

## 2014-01-08 SURGICAL SUPPLY — 33 items
BIT DRILL CANN LG 4.3MM (BIT) ×1 IMPLANT
BLADE SURG 15 STRL LF DISP TIS (BLADE) ×1 IMPLANT
BLADE SURG 15 STRL SS (BLADE) ×2
COVER SURGICAL LIGHT HANDLE (MISCELLANEOUS) ×3 IMPLANT
COVER TABLE BACK 60X90 (DRAPES) ×3 IMPLANT
DRAPE C-ARM 42X72 X-RAY (DRAPES) ×3 IMPLANT
DRAPE STERI IOBAN 125X83 (DRAPES) ×3 IMPLANT
DRILL BIT CANN LG 4.3MM (BIT) ×3
DRSG ADAPTIC 3X8 NADH LF (GAUZE/BANDAGES/DRESSINGS) ×3 IMPLANT
DRSG MEPILEX BORDER 4X4 (GAUZE/BANDAGES/DRESSINGS) ×3 IMPLANT
DRSG MEPILEX BORDER 4X8 (GAUZE/BANDAGES/DRESSINGS) ×3 IMPLANT
ELECT REM PT RETURN 9FT ADLT (ELECTROSURGICAL) ×3
ELECTRODE REM PT RTRN 9FT ADLT (ELECTROSURGICAL) ×1 IMPLANT
EVACUATOR 1/8 PVC DRAIN (DRAIN) IMPLANT
GLOVE BIOGEL PI IND STRL 9 (GLOVE) ×1 IMPLANT
GLOVE BIOGEL PI INDICATOR 9 (GLOVE) ×2
GLOVE SURG ORTHO 9.0 STRL STRW (GLOVE) ×3 IMPLANT
GOWN STRL REUS W/ TWL XL LVL3 (GOWN DISPOSABLE) ×3 IMPLANT
GOWN STRL REUS W/TWL XL LVL3 (GOWN DISPOSABLE) ×6
HIP FRA NAIL LAG SCREW 10.5X90 (Orthopedic Implant) ×3 IMPLANT
KIT BASIN OR (CUSTOM PROCEDURE TRAY) ×3 IMPLANT
KIT ROOM TURNOVER OR (KITS) ×3 IMPLANT
MANIFOLD NEPTUNE II (INSTRUMENTS) ×3 IMPLANT
NAIL HIP FRACT 130D 9X180 (Orthopedic Implant) ×3 IMPLANT
NS IRRIG 1000ML POUR BTL (IV SOLUTION) ×3 IMPLANT
PACK GENERAL/GYN (CUSTOM PROCEDURE TRAY) ×3 IMPLANT
PAD ARMBOARD 7.5X6 YLW CONV (MISCELLANEOUS) ×6 IMPLANT
PIN GUIDE 3.2 903003004 (MISCELLANEOUS) ×3 IMPLANT
SCREW BONE CORTICAL 5.0X3 (Screw) ×3 IMPLANT
SCREW LAG HIP FRA NAIL 10.5X90 (Orthopedic Implant) ×1 IMPLANT
STAPLER VISISTAT 35W (STAPLE) IMPLANT
SUT VIC AB 2-0 CTB1 (SUTURE) IMPLANT
WATER STERILE IRR 1000ML POUR (IV SOLUTION) ×6 IMPLANT

## 2014-01-08 NOTE — Transfer of Care (Signed)
Immediate Anesthesia Transfer of Care Note  Patient: Ashley Burnett  Procedure(s) Performed: Procedure(s): INTRAMEDULLARY (IM) NAIL INTERTROCHANTRIC (Left)  Patient Location: PACU  Anesthesia Type:General  Level of Consciousness: awake, alert  and patient cooperative  Airway & Oxygen Therapy: Patient Spontanous Breathing and Patient connected to nasal cannula oxygen  Post-op Assessment: Report given to PACU RN and Post -op Vital signs reviewed and stable  Post vital signs: Reviewed and stable  Complications: No apparent anesthesia complications

## 2014-01-08 NOTE — Anesthesia Preprocedure Evaluation (Signed)
Anesthesia Evaluation  Patient identified by MRN, date of birth, ID band Patient awake    Reviewed: Allergy & Precautions, H&P , NPO status , Patient's Chart, lab work & pertinent test results  Airway Mallampati: I TM Distance: >3 FB Neck ROM: Full    Dental   Pulmonary former smoker,          Cardiovascular hypertension, Pt. on medications     Neuro/Psych    GI/Hepatic GERD-  Medicated and Controlled,  Endo/Other    Renal/GU      Musculoskeletal   Abdominal   Peds  Hematology   Anesthesia Other Findings   Reproductive/Obstetrics                           Anesthesia Physical Anesthesia Plan  ASA: III  Anesthesia Plan: General   Post-op Pain Management:    Induction: Intravenous, Rapid sequence and Cricoid pressure planned  Airway Management Planned: Oral ETT  Additional Equipment:   Intra-op Plan:   Post-operative Plan: Extubation in OR  Informed Consent: I have reviewed the patients History and Physical, chart, labs and discussed the procedure including the risks, benefits and alternatives for the proposed anesthesia with the patient or authorized representative who has indicated his/her understanding and acceptance.     Plan Discussed with: CRNA and Surgeon  Anesthesia Plan Comments:         Anesthesia Quick Evaluation

## 2014-01-08 NOTE — Progress Notes (Addendum)
[facility] PROGRESS NOTE  Ashley ReeJoy Burnett GEX:[id number] DOB: 14-Oct-1917 DOA: 01/07/2014 PCP: [name], DO  Assessment/Plan:  Principal Problem:   Hip fracture, left, s/p IM nail Active Problems:   ANXIETY STATE NOS   GERD   OSTEOPOROSIS   Hypertension depression  Stable postop. Lives alone. Will likely need snf  HPI/Subjective: Feels depressed about events.  Objective: Filed Vitals:   01/08/14 1709  BP: 150/96  Pulse: 125  Temp: 99.1 F (37.3 C)  Resp: 18    Intake/Output Summary (Last 24 hours) at 01/08/14 1734 Last data filed at 01/08/14 1100  Gross per 24 hour  Intake    940 ml  Output    101 ml  Net    839 ml   There were no vitals filed for this visit.  Exam:   General:  Tearful. confused  Cardiovascular: RRR without MGR  Respiratory: CTA without WRR  Abdomen: s, nt, nd  Ext: no CCE  Basic Metabolic Panel:  Recent Labs Lab 01/07/14 1943 01/08/14 0500  NA 131* 133*  K 4.2 4.2  CL 94* 97  CO2 22 21  GLUCOSE 105* 123*  BUN 14 11  CREATININE 0.43* 0.39*  CALCIUM 9.3 8.9   Liver Function Tests:  Recent Labs Lab 01/07/14 1943  AST 22  ALT 16  ALKPHOS 79  BILITOT 0.5  PROT 7.1  ALBUMIN 3.6   No results found for this basename: LIPASE, AMYLASE,  in the last 168 hours No results found for this basename: AMMONIA,  in the last 168 hours CBC:  Recent Labs Lab 01/07/14 1943 01/08/14 0500  WBC 11.7* 10.0  NEUTROABS 8.7*  --   HGB 13.2 12.9  HCT 37.7 37.9  MCV 86.5 87.3  PLT 260 241   Cardiac Enzymes: No results found for this basename: CKTOTAL, CKMB, CKMBINDEX, TROPONINI,  in the last 168 hours BNP (last 3 results) No results found for this basename: PROBNP,  in the last 8760 hours CBG: No results found for this basename: GLUCAP,  in the last 168 hours  Recent Results (from the past 240 hour(s))  SURGICAL PCR SCREEN     Status: Abnormal   Collection Time    01/07/14 10:42 PM      Result Value Ref Range Status   MRSA, PCR NEGATIVE  NEGATIVE Final   Staphylococcus aureus POSITIVE (*) NEGATIVE Final   Comment:            The Xpert SA Assay (FDA     approved for NASAL specimens     in patients over 78 years of age),     is one component of     a comprehensive surveillance     program.  Test performance has     been validated by [facility] for patients greater     than or equal to 78 year old.     It is not intended     to diagnose infection nor to     guide or monitor treatment.     RESULT CALLED TO, READ BACK BY AND VERIFIED WITHCheral Bay:     C. BEVERLY RN [id number] GREEN R     Studies: Dg Chest 1 View  01/07/2014   CLINICAL DATA:  Pre-op respiratory exam for hip fracture.  EXAM: CHEST - 1 VIEW  COMPARISON:  02/04/2013  FINDINGS: Pulmonary hyperinflation again seen, consistent with COPD. Right lower lung scarring noted. No evidence of pulmonary infiltrate or pleural effusion. Heart size  is within normal limits. No mass or lymphadenopathy identified.  IMPRESSION: COPD.  No active disease.   Electronically Signed   By: Myles RosenthalJohn  Stahl M.D.   On: 01/07/2014 19:49   Dg Hip Complete Left  01/07/2014   CLINICAL DATA:  80 year old female status post fall with pain. Initial encounter.  EXAM: LEFT HIP - COMPLETE 2+ VIEW  COMPARISON:  None.  FINDINGS: Lucency through the intertrochanteric region of the proximal left femur consistent with fracture. Left femoral head remains normally located.  Osteopenia. Aortoiliac calcified atherosclerosis noted. No acute fracture of the pelvis identified. Grossly intact proximal right femur.  IMPRESSION: Acute intertrochanteric left femur fracture, minimally displaced.   Electronically Signed   By: [name]   On: 01/07/2014 19:49   Dg Hip Operative Left  01/08/2014   CLINICAL DATA:  Left intertrochanteric hip fracture.  EXAM: OPERATIVE LEFT HIP  COMPARISON:  01/07/2014.  FINDINGS: 2 intraoperative fluoroscopic spot views of the left hip demonstrate interval placement of a  gamma nail fixation device, with restoration of anatomic alignment. The device appears properly located, without obvious complicating features. Femoral head appears located on the single view provided.  IMPRESSION: 1. Status post ORIF for intertrochanteric hip fracture, as above.   Electronically Signed   By: Trudie Reedaniel  Entrikin M.D.   On: 01/08/2014 14:10    Scheduled Meds: . [START ON 01/09/2014] aspirin EC  325 mg Oral Q breakfast  .  ceFAZolin (ANCEF) IV  2 g Intravenous Q6H  . docusate sodium  100 mg Oral BID  . feeding supplement (ENSURE COMPLETE)  237 mL Oral BID BM  . FLUoxetine  20 mg Oral Daily  . HYDROmorphone      . mupirocin ointment  1 application Nasal BID  . pantoprazole  40 mg Oral Daily   Continuous Infusions: . sodium chloride      Time spent: 25 minutes  Christiane Haorinna L Teaghan Formica, M.D. [facility] Pager [phone]. If 7PM-7AM, please contact night-coverage at [email or web address], password [email or web address] 01/08/2014, 5:34 PM  LOS: 1 day

## 2014-01-08 NOTE — Op Note (Signed)
OPERATIVE REPORT  DATE OF SURGERY: 01/08/2014  PATIENT:  Ashley Burnett,  80 y.o. female  PRE-OPERATIVE DIAGNOSIS:  LEFT INTERTROCHANTERIC FRACTURE  POST-OPERATIVE DIAGNOSIS:  LEFT INTERTROCHANTERIC FRACTURE  PROCEDURE:  Procedure(s): INTRAMEDULLARY (IM) NAIL INTERTROCHANTRIC Biomet nail 9 x 170 mm with 90 mm barrel 34 mm locking screw  SURGEON:  Surgeon(s): Nadara MustardMarcus V Kaydn Kumpf, MD  ANESTHESIA:   general  EBL:  min ML  SPECIMEN:  No Specimen  TOURNIQUET:  * No tourniquets in log *  PROCEDURE DETAILS: Patient is a 80 year old woman who had a mechanical fall sustaining an intertrochanteric hip fracture the left. Patient presents at this time for internal fixation. Risks and benefits were discussed with her family including infection neurovascular injury pain failure of hardware potential for mortality. Patient's family state they understand and wish to proceed at this time. Description of procedure patient was brought to the operating room and underwent a general anesthetic. After adequate levels and anesthesia obtained patient's left lower extremity was prepped using DuraPrep draped into a sterile field. A timeout was called. Incision was made just proximal to the greater trochanter. A guidewire was inserted from the greater trochanter down the femoral canal this was checked with C-arm fluoroscopy AP and lateral planes. This was overdrilled the nail was inserted the nail was locked proximally with a 90 mm barrel just inferior to the center center of the head. Distally the nail was locked with a 34 mm screw. The wounds were irrigated with normal saline. Subcutaneous is closed in 2-0 Vicryl skin was closed using staples. The wounds were covered with Mepilex dressing. Patient was extubated taken to the PACU in stable condition.  PLAN OF CARE: Admit to inpatient   PATIENT DISPOSITION:  PACU - hemodynamically stable.   Nadara MustardMarcus V Tamas Suen, MD 01/08/2014 9:59 AM

## 2014-01-08 NOTE — Anesthesia Procedure Notes (Signed)
Procedure Name: Intubation Date/Time: 01/08/2014 9:18 AM Performed by: Arlice ColtMANESS, Benn Tarver B Pre-anesthesia Checklist: Patient identified, Emergency Drugs available, Suction available, Patient being monitored and Timeout performed Patient Re-evaluated:Patient Re-evaluated prior to inductionOxygen Delivery Method: Circle system utilized Preoxygenation: Pre-oxygenation with 100% oxygen Intubation Type: IV induction and Rapid sequence Laryngoscope Size: Mac and 3 Grade View: Grade III Tube type: Oral Tube size: 7.0 mm Number of attempts: 2 Airway Equipment and Method: Stylet Placement Confirmation: ETT inserted through vocal cords under direct vision,  positive ETCO2 and breath sounds checked- equal and bilateral Secured at: 21 cm Tube secured with: Tape Dental Injury: Teeth and Oropharynx as per pre-operative assessment

## 2014-01-08 NOTE — Anesthesia Postprocedure Evaluation (Signed)
Anesthesia Post Note  Patient: Ashley Burnett  Procedure(s) Performed: Procedure(s) (LRB): INTRAMEDULLARY (IM) NAIL INTERTROCHANTRIC (Left)  Anesthesia type: general  Patient location: PACU  Post pain: Pain level controlled  Post assessment: Patient's Cardiovascular Status Stable  Last Vitals:  Filed Vitals:   01/08/14 1045  BP:   Pulse:   Temp: 36.8 C  Resp:     Post vital signs: Reviewed and stable  Level of consciousness: sedated  Complications: No apparent anesthesia complications

## 2014-01-08 NOTE — Progress Notes (Signed)
Chaplain responded to request for emotional/spiritual support. Patient's RN said that pt had surgery this morning and is confused, tearful. Patient said she feels "depressed" and "lonely." She asked several times who the chaplain was, if she had had surgery yet, and what day/time it was. Chaplain provided empathetic listening, pastoral presence, and emotional/spiritual support. Will refer to unit chaplain. Please page for follow up.   Maurene CapesHillary D Irusta [phone]

## 2014-01-08 NOTE — H&P (View-Only) (Signed)
Reason for Consult: Left hip intertrochanteric hip fracture Referring Physician: Dr. Jeanell Burnett  Ashley Burnett is an 80 y.o. female.  HPI: Patient patient is a 80 year old woman who was out trying to trim some branches on a tray when she fell mechanical fall landing on her left hip. Patient denies any other injuries. Patient does have some memory issues.  Past Medical History  Diagnosis Date  . Diverticulosis     universal,severe  . Family history of GERD   . Osteoporosis   . Transient ischemic attack   . Fracture     leg wrist  . High cholesterol   . Depression   . Gastritis   . Arthritis     Past Surgical History  Procedure Laterality Date  . Abdominal hysterectomy    . Laparotomy  1960    [facility]  . Ovarian cyst removal    . Tonsillectomy    . Appendectomy    . Abdominal adhesion surgery  1993    [facility]   . Laparotomy N/A 02/09/2013    Procedure: EXPLORATORY LAPAROTOMY;  Surgeon: Rolm Bookbinder, MD;  Location: [facility];  Service: General;  Laterality: N/A;  . Colon resection N/A 02/09/2013    Procedure: COLON RESECTION;  Surgeon: Rolm Bookbinder, MD;  Location: [facility];  Service: General;  Laterality: N/A;    Family History  Problem Relation Age of Onset  . Stroke    . Colon cancer    . Heart disease      aunt  . Melanoma Mother   . Cancer Mother     melenoma  . Tuberculosis Father     Social History:  reports that she has quit smoking. She has never used smokeless tobacco. She reports that she does not drink alcohol or use illicit drugs.  Allergies:  Allergies  Allergen Reactions  . Codeine     unknown  . Indomethacin     Unknown. Spoke to granddaughter on 02/05/13 who stated she was not aware of this allergy and that her grandmother (Ashley Burnett) does not remember this allergy either.   . Morphine And Related     unknown    Medications: I have reviewed the patient's current medications.  Results for orders placed during the hospital encounter of 01/07/14 (from the past  48 hour(s))  APTT     Status: None   Collection Time    01/07/14  7:43 PM      Result Value Ref Range   aPTT 34  24 - 37 seconds  CBC WITH DIFFERENTIAL     Status: Abnormal   Collection Time    01/07/14  7:43 PM      Result Value Ref Range   WBC 11.7 (*) 4.0 - 10.5 K/uL   RBC 4.36  3.87 - 5.11 MIL/uL   Hemoglobin 13.2  12.0 - 15.0 g/dL   HCT 37.7  36.0 - 46.0 %   MCV 86.5  78.0 - 100.0 fL   MCH 30.3  26.0 - 34.0 pg   MCHC 35.0  30.0 - 36.0 g/dL   RDW 14.0  11.5 - 15.5 %   Platelets 260  150 - 400 K/uL   Neutrophils Relative % 73  43 - 77 %   Neutro Abs 8.7 (*) 1.7 - 7.7 K/uL   Lymphocytes Relative 15  12 - 46 %   Lymphs Abs 1.8  0.7 - 4.0 K/uL   Monocytes Relative 9  3 - 12 %   Monocytes Absolute 1.0  0.1 -  1.0 K/uL   Eosinophils Relative 2  0 - 5 %   Eosinophils Absolute 0.2  0.0 - 0.7 K/uL   Basophils Relative 1  0 - 1 %   Basophils Absolute 0.1  0.0 - 0.1 K/uL  COMPREHENSIVE METABOLIC PANEL     Status: Abnormal   Collection Time    01/07/14  7:43 PM      Result Value Ref Range   Sodium 131 (*) 137 - 147 mEq/L   Potassium 4.2  3.7 - 5.3 mEq/L   Chloride 94 (*) 96 - 112 mEq/L   CO2 22  19 - 32 mEq/L   Glucose, Bld 105 (*) 70 - 99 mg/dL   BUN 14  6 - 23 mg/dL   Creatinine, Ser 0.43 (*) 0.50 - 1.10 mg/dL   Calcium 9.3  8.4 - 10.5 mg/dL   Total Protein 7.1  6.0 - 8.3 g/dL   Albumin 3.6  3.5 - 5.2 g/dL   AST 22  0 - 37 U/L   ALT 16  0 - 35 U/L   Alkaline Phosphatase 79  39 - 117 U/L   Total Bilirubin 0.5  0.3 - 1.2 mg/dL   GFR calc non Af Amer 85 (*) >90 mL/min   GFR calc Af Amer >90  >90 mL/min   Comment: (NOTE)     The eGFR has been calculated using the CKD EPI equation.     This calculation has not been validated in all clinical situations.     eGFR's persistently <90 mL/min signify possible Chronic Kidney     Disease.  PROTIME-INR     Status: None   Collection Time    01/07/14  7:43 PM      Result Value Ref Range   Prothrombin Time 13.4  11.6 - 15.2 seconds    INR 1.04  0.00 - 1.49  TYPE AND SCREEN     Status: None   Collection Time    01/07/14  8:20 PM      Result Value Ref Range   ABO/RH(D) A POS     Antibody Screen NEG     Sample Expiration 01/10/2014      Dg Chest 1 View  01/07/2014   CLINICAL DATA:  Pre-op respiratory exam for hip fracture.  EXAM: CHEST - 1 VIEW  COMPARISON:  02/04/2013  FINDINGS: Pulmonary hyperinflation again seen, consistent with COPD. Right lower lung scarring noted. No evidence of pulmonary infiltrate or pleural effusion. Heart size is within normal limits. No mass or lymphadenopathy identified.  IMPRESSION: COPD.  No active disease.   Electronically Signed   By: Ashley  Burnett M.D.   On: 01/07/2014 19:49   Dg Hip Complete Left  01/07/2014   CLINICAL DATA:  89-year-old female status post fall with pain. Initial encounter.  EXAM: LEFT HIP - COMPLETE 2+ VIEW  COMPARISON:  None.  FINDINGS: Lucency through the intertrochanteric region of the proximal left femur consistent with fracture. Left femoral head remains normally located.  Osteopenia. Aortoiliac calcified atherosclerosis noted. No acute fracture of the pelvis identified. Grossly intact proximal right femur.  IMPRESSION: Acute intertrochanteric left femur fracture, minimally displaced.   Electronically Signed   By: Ashley  Burnett M.D.   On: 01/07/2014 19:49    Review of Systems  All other systems reviewed and are negative.  Blood pressure 167/87, pulse 108, temperature 98.7 F (37.1 C), resp. rate 16, SpO2 93.00%. Physical Exam On examination patient has a strong dorsalis pedis pulse. Her left leg is   shortened and externally rotated. 2 view radiographs of the left hip shows an intertrochanteric hip fracture. Assessment/Plan: Assessment: Left intertrochanteric hip fracture.  Plan: Will plan for intramedullary nail fixation of the left hip. Risks and benefits were discussed with the patient's daughter who is the power of attorney including infection neurovascular injury  pain failure of internal fixation DVT need for additional surgery. Patient power of attorney  states she understands and wished to proceed at this time.  Ashley Burnett 01/07/2014, 9:26 PM      

## 2014-01-08 NOTE — Interval H&P Note (Signed)
History and Physical Interval Note:  01/08/2014 7:00 AM  Ashley Burnett  has presented today for surgery, with the diagnosis of LEFT INTERTROCHANTERIC FRACTURE  The various methods of treatment have been discussed with the patient and family. After consideration of risks, benefits and other options for treatment, the patient has consented to  Procedure(s): INTRAMEDULLARY (IM) NAIL INTERTROCHANTRIC (Left) as a surgical intervention .  The patient's history has been reviewed, patient examined, no change in status, stable for surgery.  I have reviewed the patient's chart and labs.  Questions were answered to the patient's satisfaction.     Nadara MustardMarcus V Jaysha Lasure

## 2014-01-09 ENCOUNTER — Encounter (HOSPITAL_COMMUNITY): Payer: Self-pay | Admitting: Internal Medicine

## 2014-01-09 DIAGNOSIS — E43 Unspecified severe protein-calorie malnutrition: Secondary | ICD-10-CM | POA: Insufficient documentation

## 2014-01-09 DIAGNOSIS — F0390 Unspecified dementia without behavioral disturbance: Secondary | ICD-10-CM | POA: Diagnosis present

## 2014-01-09 DIAGNOSIS — R413 Other amnesia: Secondary | ICD-10-CM

## 2014-01-09 LAB — CBC
HCT: 29.9 % — ABNORMAL LOW (ref 36.0–46.0)
HEMOGLOBIN: 10.3 g/dL — AB (ref 12.0–15.0)
MCH: 30.1 pg (ref 26.0–34.0)
MCHC: 34.4 g/dL (ref 30.0–36.0)
MCV: 87.4 fL (ref 78.0–100.0)
Platelets: 188 10*3/uL (ref 150–400)
RBC: 3.42 MIL/uL — ABNORMAL LOW (ref 3.87–5.11)
RDW: 14.3 % (ref 11.5–15.5)
WBC: 6 10*3/uL (ref 4.0–10.5)

## 2014-01-09 LAB — BASIC METABOLIC PANEL
BUN: 12 mg/dL (ref 6–23)
CO2: 27 mEq/L (ref 19–32)
CREATININE: 0.5 mg/dL (ref 0.50–1.10)
Calcium: 8.3 mg/dL — ABNORMAL LOW (ref 8.4–10.5)
Chloride: 101 mEq/L (ref 96–112)
GFR calc Af Amer: >90 mL/min (ref >90)
GFR calc non Af Amer: 81 mL/min — ABNORMAL LOW (ref >90)
GLUCOSE: 104 mg/dL — AB (ref 70–99)
POTASSIUM: 3.9 meq/L (ref 3.7–5.3)
Sodium: 137 mEq/L (ref 137–147)

## 2014-01-09 LAB — VITAMIN D 25 HYDROXY (VIT D DEFICIENCY, FRACTURES): VIT D 25 HYDROXY: 17 ng/mL — AB (ref 30–89)

## 2014-01-09 LAB — URINE CULTURE: Colony Count: 40000

## 2014-01-09 NOTE — Progress Notes (Signed)
[facility] PROGRESS NOTE  Ashley Burnett WGN:[id number] DOB: 14-Jan-1918 DOA: 01/07/2014 PCP: [name], DO  Assessment/Plan:  Principal Problem:   Hip fracture, left, s/p IM nail Active Problems:   ANXIETY STATE NOS   GERD   OSTEOPOROSIS   Hypertension Depression Dementia:  Per daughter, has had dementia for several years Protein calorie malnutrition  SNF when stable and bed found.  Daughter requests [facility]  HPI/Subjective: Doesn't remember yesterday. Pain controlled. Eating ok  Objective: Filed Vitals:   01/09/14 1334  BP: 95/46  Pulse: 92  Temp: 98.1 F (36.7 C)  Resp: 18    Intake/Output Summary (Last 24 hours) at 01/09/14 1335 Last data filed at 01/09/14 0657  Gross per 24 hour  Intake    384 ml  Output      0 ml  Net    384 ml   Filed Weights   01/09/14 0207  Weight: 39.463 kg (87 lb)    Exam:   General:  Oriented to person place. Thinks she presented with "obstruction  Cardiovascular: RRR without MGR  Respiratory: CTA without WRR  Abdomen: s, nt, nd  Ext: no CCE  Basic Metabolic Panel:  Recent Labs Lab 01/07/14 1943 01/08/14 0500 01/09/14 0405  NA 131* 133* 137  K 4.2 4.2 3.9  CL 94* 97 101  CO2 22 21 27   GLUCOSE 105* 123* 104*  BUN 14 11 12   CREATININE 0.43* 0.39* 0.50  CALCIUM 9.3 8.9 8.3*   Liver Function Tests:  Recent Labs Lab 01/07/14 1943  AST 22  ALT 16  ALKPHOS 79  BILITOT 0.5  PROT 7.1  ALBUMIN 3.6   No results found for this basename: LIPASE, AMYLASE,  in the last 168 hours No results found for this basename: AMMONIA,  in the last 168 hours CBC:  Recent Labs Lab 01/07/14 1943 01/08/14 0500 01/09/14 0405  WBC 11.7* 10.0 6.0  NEUTROABS 8.7*  --   --   HGB 13.2 12.9 10.3*  HCT 37.7 37.9 29.9*  MCV 86.5 87.3 87.4  PLT 260 241 188   Cardiac Enzymes: No results found for this basename: CKTOTAL, CKMB, CKMBINDEX, TROPONINI,  in the last 168 hours BNP (last 3 results) No results found for  this basename: PROBNP,  in the last 8760 hours CBG: No results found for this basename: GLUCAP,  in the last 168 hours  Recent Results (from the past 240 hour(s))  URINE CULTURE     Status: None   Collection Time    01/07/14 10:09 PM      Result Value Ref Range Status   Specimen Description URINE, CLEAN CATCH   Final   Special Requests NONE   Final   Culture  Setup Time     Final   Value: 01/07/2014 22:26     Performed at [facility]   Colony Count     Final   Value: 40,000 COLONIES/ML     Performed at [facility]   Culture     Final   Value: Multiple bacterial morphotypes present, none predominant. Suggest appropriate recollection if clinically indicated.     Performed at [facility]   Report Status 01/09/2014 FINAL   Final  SURGICAL PCR SCREEN     Status: Abnormal   Collection Time    01/07/14 10:42 PM      Result Value Ref Range Status   MRSA, PCR NEGATIVE  NEGATIVE Final   Staphylococcus aureus POSITIVE (*) NEGATIVE Final   Comment:  The Xpert SA Assay (FDA     approved for NASAL specimens     in patients over 78 years of age),     is one component of     a comprehensive surveillance     program.  Test performance has     been validated by [facility] for patients greater     than or equal to 78 year old.     It is not intended     to diagnose infection nor to     guide or monitor treatment.     RESULT CALLED TO, READ BACK BY AND VERIFIED WITHCheral Bay:     C. BEVERLY RN [phone] GREEN R     Studies: Dg Chest 1 View  01/07/2014   CLINICAL DATA:  Pre-op respiratory exam for hip fracture.  EXAM: CHEST - 1 VIEW  COMPARISON:  02/04/2013  FINDINGS: Pulmonary hyperinflation again seen, consistent with COPD. Right lower lung scarring noted. No evidence of pulmonary infiltrate or pleural effusion. Heart size is within normal limits. No mass or lymphadenopathy identified.  IMPRESSION: COPD.  No active disease.   Electronically Signed   By:  Myles RosenthalJohn  Stahl M.D.   On: 01/07/2014 19:49   Dg Hip Complete Left  01/07/2014   CLINICAL DATA:  80 year old female status post fall with pain. Initial encounter.  EXAM: LEFT HIP - COMPLETE 2+ VIEW  COMPARISON:  None.  FINDINGS: Lucency through the intertrochanteric region of the proximal left femur consistent with fracture. Left femoral head remains normally located.  Osteopenia. Aortoiliac calcified atherosclerosis noted. No acute fracture of the pelvis identified. Grossly intact proximal right femur.  IMPRESSION: Acute intertrochanteric left femur fracture, minimally displaced.   Electronically Signed   By: [name]   On: 01/07/2014 19:49   Dg Hip Operative Left  01/08/2014   CLINICAL DATA:  Left intertrochanteric hip fracture.  EXAM: OPERATIVE LEFT HIP  COMPARISON:  01/07/2014.  FINDINGS: 2 intraoperative fluoroscopic spot views of the left hip demonstrate interval placement of a gamma nail fixation device, with restoration of anatomic alignment. The device appears properly located, without obvious complicating features. Femoral head appears located on the single view provided.  IMPRESSION: 1. Status post ORIF for intertrochanteric hip fracture, as above.   Electronically Signed   By: Trudie Reedaniel  Entrikin M.D.   On: 01/08/2014 14:10    Scheduled Meds: . aspirin EC  325 mg Oral Q breakfast  . docusate sodium  100 mg Oral BID  . feeding supplement (ENSURE COMPLETE)  237 mL Oral BID BM  . FLUoxetine  20 mg Oral Daily  . mupirocin ointment  1 application Nasal BID  . pantoprazole  40 mg Oral Daily   Continuous Infusions: . sodium chloride 20 mL/hr at 01/08/14 1130    Time spent: 25 minutes  Christiane Haorinna L Paizlee Kinder, M.D. [facility] Pager [phone]. If 7PM-7AM, please contact night-coverage at [email or web address], password [email or web address] 01/09/2014, 1:35 PM  LOS: 2 days

## 2014-01-09 NOTE — Clinical Documentation Improvement (Signed)
 - "  Confused" documented in physician progress note and nursing progress note   - s/p surgical procedure this admission   - as prescribed narcotics administered this admission   Please clarify the Possible, Probable or Known cause of the patient's confusion.  Thank you.  Jerral Ralphathy R Sakib Noguez ,RN BSN CCDS Certified Clinical Documentation Specialist:  [phone] [facility]- Health Information Management

## 2014-01-09 NOTE — Evaluation (Signed)
Physical Therapy Evaluation Patient Details Name: Ashley Burnett MRN: [id number] DOB: Apr 28, 1918 Today's Date: 01/09/2014   History of Present Illness  pt rpesents with L hip fx s/p IM nail.    Clinical Impression  Pt very painful with all mobility.  Pt very motivated stating she has to try to walk or she never will, but then crying with attempts to stand.  Discussed with pt talking to RN about different pain meds to help control pain so pt won't hurt and be so tearful with attempts at mobility.  Will continue to follow.      Follow Up Recommendations SNF    Equipment Recommendations   (TBD)    Recommendations for Other Services       Precautions / Restrictions Precautions Precautions: Fall Restrictions Weight Bearing Restrictions: Yes LLE Weight Bearing: Weight bearing as tolerated      Mobility  Bed Mobility Overal bed mobility: Needs Assistance;+2 for physical assistance Bed Mobility: Supine to Sit;Sit to Supine     Supine to sit: Mod assist;+2 for physical assistance;HOB elevated Sit to supine: Max assist;+2 for physical assistance   General bed mobility comments: cues for sequencing and encouragement.  pt very painful and tearful with return to supine.    Transfers Overall transfer level: Needs assistance Equipment used: 2 person hand held assist Transfers: Sit to/from Stand Sit to Stand: Max assist;+2 physical assistance         General transfer comment: Attempted to come to stand, however pt very painful and unable to tolerate.  pt called out and became tearful.  pt crying and wanting to attempt again, however still too painful.    Ambulation/Gait                Stairs            Wheelchair Mobility    Modified Rankin (Stroke Patients Only)       Balance                                             Pertinent Vitals/Pain Crying and calls out with mobility.  Premedicated.  RN made aware of pain.      Home Living  Family/patient expects to be discharged to:: Skilled nursing facility                      Prior Function Level of Independence: Independent               Hand Dominance        Extremity/Trunk Assessment   Upper Extremity Assessment: Defer to OT evaluation           Lower Extremity Assessment: Generalized weakness;LLE deficits/detail   LLE Deficits / Details: Difficult to assess 2/2 pain, but pt does A with movement of LE.       Communication   Communication: No difficulties  Cognition Arousal/Alertness: Awake/alert Behavior During Therapy: WFL for tasks assessed/performed Overall Cognitive Status: History of cognitive impairments - at baseline                      General Comments      Exercises        Assessment/Plan    PT Assessment Patient needs continued PT services  PT Diagnosis Difficulty walking;Acute pain   PT Problem List Decreased strength;Decreased activity tolerance;Decreased balance;Decreased mobility;Decreased cognition;Decreased  knowledge of use of DME;Pain  PT Treatment Interventions DME instruction;Gait training;Functional mobility training;Therapeutic activities;Therapeutic exercise;Balance training;Patient/family education   PT Goals (Current goals can be found in the Care Plan section) Acute Rehab PT Goals Patient Stated Goal: To walk again PT Goal Formulation: With patient Time For Goal Achievement: 01/23/14 Potential to Achieve Goals: Fair    Frequency Min 3X/week   Barriers to discharge        Co-evaluation               End of Session Equipment Utilized During Treatment: Gait belt Activity Tolerance: Patient limited by pain Patient left: in bed;with call bell/phone within reach Nurse Communication: Mobility status;Patient requests pain meds         Time: [id number] PT Time Calculation (min): 33 min   Charges:   PT Evaluation $Initial PT Evaluation Tier I: 1 Procedure PT  Treatments $Therapeutic Activity: 23-37 mins   PT G CodesSunny Schlein:          Marland Reine F Antoniette Peake, PT [phone] 01/09/2014, 11:47 AM

## 2014-01-09 NOTE — Progress Notes (Signed)
OT Cancellation Note  Patient Details Name: Bing ReeJoy Nall MRN: [id number] DOB: 05-28-1918   Cancelled Treatment:    Reason Eval/Treat Not Completed: Pain limiting ability to participate (Will continue to follow.)  Dayton BailiffJulie Lynn Julane Crock 01/09/2014, 11:54 AM

## 2014-01-09 NOTE — Progress Notes (Signed)
Clinical Social Work Department CLINICAL SOCIAL WORK PLACEMENT NOTE 01/09/2014  Patient:  Ashley Burnett,Ashley Burnett  Account Number:  [id number] Admit date:  01/07/2014  Clinical Social Worker:  Sharol HarnessPOONUM Nidya Bouyer, Theresia MajorsLCSWA  Date/time:  01/09/2014 03:00 PM  Clinical Social Work is seeking post-discharge placement for this patient at the following level of care:   SKILLED NURSING   (*CSW will update this form in Epic as items are completed)   01/09/2014  Patient/family provided with [facility] of Clinical [facility] Work's list of facilities offering this level of care within the geographic area requested by the patient (or if unable, by the patient's family).  01/09/2014  Patient/family informed of their freedom to choose among providers that offer the needed level of care, that participate in Medicare, Medicaid or managed care program needed by the patient, have an available bed and are willing to accept the patient.  01/09/2014  Patient/family informed of [facility]' ownership interest in [facility], as well as of the fact that they are under no obligation to receive care at this facility.  PASARR submitted to EDS on Existing PASARR number received from EDS on   FL2 transmitted to all facilities in geographic area requested by pt/family on  01/09/2014 FL2 transmitted to all facilities within larger geographic area on   Patient informed that his/her managed care company has contracts with or will negotiate with  certain facilities, including the following:     Patient/family informed of bed offers received:   Patient chooses bed at  Physician recommends and patient chooses bed at    Patient to be transferred to  on   Patient to be transferred to facility by   The following physician request were entered in Epic:   Additional CommentsSharol Harness:   Alinda Egolf, LCSWA [phone]

## 2014-01-09 NOTE — Progress Notes (Signed)
INITIAL NUTRITION ASSESSMENT  Pt meets criteria for SEVERE MALNUTRITION in the context of suspected chronic illness as evidenced by severe fat and muscle wasting.  DOCUMENTATION CODES Per approved criteria  -Severe malnutrition in the context of chronic illness   INTERVENTION: Ensure Complete po BID, each supplement provides 350 kcal and 13 grams of protein Encourage PO intake at every meal. Encouraged small snacks if pt not eating well at meals.   NUTRITION DIAGNOSIS: Malnutrition related to possible depression vs living alone as evidenced by severe fat and muscle wasting.   Goal: Pt to meet >/= 90% of their estimated nutrition needs   Monitor:  PO intake, weight trend, labs  Reason for Assessment: Report  80 y.o. female  Admitting Dx: Hip fracture, left  ASSESSMENT: Pt admitted from home s/p fall with left hip fx. Pt had repair 4/19, plan for SNF at d/c.  Pt with very flat affect. Per pt she does not eat too much, in her opinion it is because she is not that active.  Pt states that she has weighed 100 lb for a long time and was surprised to hear that she was 87 lb. Per pt she only goes to the doctor when she has too, therefore weight loss would not have been identified. Pt denies any hx of eating disorders.  24 hr recall: Breakfast: juice and coffee Lunch: buttermilk Dinner: chicken and salad  Discussed how her kcal/protein needs are different after fx and surgery. Pt with early satiety. Encouraged pt to eat at meals, add snacks if not eating much at meals, Ensure once daily to get in enough kcal/protein for healing.  Pt seemed agreeable to plan, states that her daughter has been concerned about how little she eats.   Height: Ht Readings from Last 1 Encounters:  01/09/14 5\' 5"  (1.651 m)    Weight: Wt Readings from Last 1 Encounters:  01/09/14 87 lb (39.463 kg)    Ideal Body Weight: 56.8 kg   % Ideal Body Weight: 69%  Wt Readings from Last 10 Encounters:   01/09/14 87 lb (39.463 kg)  01/09/14 87 lb (39.463 kg)  04/18/13 87 lb 6.4 oz (39.644 kg)  03/11/13 89 lb (40.37 kg)  02/28/13 86 lb 3.2 oz (39.1 kg)  02/09/13 99 lb 3.3 oz (45 kg)  02/09/13 99 lb 3.3 oz (45 kg)  09/26/11 112 lb 9.6 oz (51.075 kg)  07/21/11 105 lb 3.2 oz (47.718 kg)  03/04/11 103 lb 12.8 oz (47.083 kg)    Usual Body Weight: 87-89 lb x 10 months  % Usual Body Weight: 100%  BMI:  Body mass index is 14.48 kg/(m^2).  Estimated Nutritional Needs: Kcal: 1200-1350 Protein: 55-65 grams Fluid: > 1.5 L/day  Skin: left hip incision  Diet Order: General  EDUCATION NEEDS: -No education needs identified at this time   Intake/Output Summary (Last 24 hours) at 01/09/14 1113 Last data filed at 01/09/14 0657  Gross per 24 hour  Intake    384 ml  Output      0 ml  Net    384 ml    Last BM: 4/19   Labs:   Recent Labs Lab 01/07/14 1943 01/08/14 0500 01/09/14 0405  NA 131* 133* 137  K 4.2 4.2 3.9  CL 94* 97 101  CO2 22 21 27   BUN 14 11 12   CREATININE 0.43* 0.39* 0.50  CALCIUM 9.3 8.9 8.3*  GLUCOSE 105* 123* 104*    CBG (last 3)  No results found for this  basename: GLUCAP,  in the last 72 hours  Scheduled Meds: . aspirin EC  325 mg Oral Q breakfast  . docusate sodium  100 mg Oral BID  . feeding supplement (ENSURE COMPLETE)  237 mL Oral BID BM  . FLUoxetine  20 mg Oral Daily  . mupirocin ointment  1 application Nasal BID  . pantoprazole  40 mg Oral Daily    Continuous Infusions: . sodium chloride 20 mL/hr at 01/08/14 1130    Past Medical History  Diagnosis Date  . Diverticulosis     universal,severe  . Family history of GERD   . Osteoporosis   . Transient ischemic attack   . Fracture     leg wrist  . High cholesterol   . Depression   . Gastritis   . Arthritis     Past Surgical History  Procedure Laterality Date  . Abdominal hysterectomy    . Laparotomy  1960    [facility]  . Ovarian cyst removal    . Tonsillectomy    . Appendectomy     . Abdominal adhesion surgery  1993    [facility]   . Laparotomy N/A 02/09/2013    Procedure: EXPLORATORY LAPAROTOMY;  Surgeon: Emelia LoronMatthew Wakefield, MD;  [facility];  Service: General;  Laterality: N/A;  . Colon resection N/A 02/09/2013    Procedure: COLON RESECTION;  Surgeon: Emelia LoronMatthew Wakefield, MD;  [facility];  Service: General;  Laterality: N/A;    Kendell BaneHeather Laurette Villescas RD, LDN, CNSC [phone] Pager [phone] After Hours Pager

## 2014-01-09 NOTE — Progress Notes (Signed)
Clinical Social Work Department BRIEF PSYCHOSOCIAL ASSESSMENT 01/09/2014  Patient:  Ashley Burnett,Ashley Burnett     Account Number:  [id number]     Admit date:  01/07/2014  Clinical Social Worker:  Harless NakayamaAMBELAL,Jaquil Todt, LCSWA  Date/Time:  01/09/2014 03:00 PM  Referred by:  Physician  Date Referred:  01/09/2014 Referred for  SNF Placement   Other Referral:   Interview type:  Patient Other interview type:   Spoke with pt at bedside and pt daughter over the phone    PSYCHOSOCIAL DATA Living Status:  ALONE Admitted from facility:   Level of care:   Primary support name:  Ashley Burnett [phone] Primary support relationship to patient:  CHILD, ADULT Degree of support available:   Pt has supportive family    CURRENT CONCERNS Current Concerns  Post-Acute Placement   Other Concerns:    SOCIAL WORK ASSESSMENT / PLAN CSW aware that PT is recommending SNF. CSW spoke with pt daughter over the phone as multiple notes confirmed pt has dementia and is not oriented. CSW spoke in length with pt daughter and she would like for pt to dc to [facility]. Pt has been to this facility before and pt daughter said it was a very positive experience. At this time, pt family not wanting to fax referral to other facilities. CSW called facility and informed of pt family preference.    CSW did also visit pt room and spoke with pt about SNF. Pt reports that she vaguely remembers previous stay [facility] and is agreeable to return as long as this is for short term rehab. CSW confirmed that is the plan. Pt expressed how much she loves her home and enjoys being there and would not want to leave. CSW offered understanding and support.   Assessment/plan status:  Psychosocial Support/Ongoing Assessment of Needs Other assessment/ plan:   Information/referral to community resources:   SNF list denied at this time    PATIENT'S/FAMILY'S RESPONSE TO PLAN OF CARE: Pt and pt family agreeable to SNF at [facility].       Khari Lett, [place] [phone]

## 2014-01-09 NOTE — Progress Notes (Signed)
Patient ID: Ashley Burnett,Ashley Burnett female   DOB: October 31, 1917, 80 y.o.   MRN: [id number] Patient alert this morning. Hemoglobin stable. Plan for discharge to skilled nursing. Weightbearing as tolerated.

## 2014-01-09 NOTE — Progress Notes (Signed)
Chaplain made follow-up visit per referral. Pt stated that she is "very confused" about why she's here and does not remember having surgery. She expressed that she enjoys being active and living at home with her cat and is anxious to be discharged. Pt stated she has a daughter who lives locally but is busy with her job. Chaplain offered compassionate presence and conversation. Pt asked that chaplain visit again. Will follow up as necessary.

## 2014-01-10 DIAGNOSIS — F0390 Unspecified dementia without behavioral disturbance: Secondary | ICD-10-CM

## 2014-01-10 DIAGNOSIS — E43 Unspecified severe protein-calorie malnutrition: Secondary | ICD-10-CM

## 2014-01-10 LAB — BASIC METABOLIC PANEL
BUN: 17 mg/dL (ref 6–23)
CALCIUM: 8.4 mg/dL (ref 8.4–10.5)
CO2: 26 mEq/L (ref 19–32)
Chloride: 99 mEq/L (ref 96–112)
Creatinine, Ser: 0.37 mg/dL — ABNORMAL LOW (ref 0.50–1.10)
GFR calc Af Amer: >90 mL/min (ref >90)
GFR, EST NON AFRICAN AMERICAN: 89 mL/min — AB (ref >90)
GLUCOSE: 122 mg/dL — AB (ref 70–99)
Potassium: 4.1 mEq/L (ref 3.7–5.3)
Sodium: 136 mEq/L — ABNORMAL LOW (ref 137–147)

## 2014-01-10 LAB — CBC
HEMATOCRIT: 31.4 % — AB (ref 36.0–46.0)
HEMOGLOBIN: 10.5 g/dL — AB (ref 12.0–15.0)
MCH: 29.7 pg (ref 26.0–34.0)
MCHC: 33.4 g/dL (ref 30.0–36.0)
MCV: 88.7 fL (ref 78.0–100.0)
Platelets: 209 10*3/uL (ref 150–400)
RBC: 3.54 MIL/uL — ABNORMAL LOW (ref 3.87–5.11)
RDW: 14.3 % (ref 11.5–15.5)
WBC: 9 10*3/uL (ref 4.0–10.5)

## 2014-01-10 MED ORDER — HYDROCODONE-ACETAMINOPHEN 5-325 MG PO TABS: 1.0000 | ORAL_TABLET | Freq: Four times a day (QID) | ORAL | Status: DC | PRN

## 2014-01-10 MED ORDER — ONDANSETRON HCL 4 MG PO TABS: 4.0000 mg | ORAL_TABLET | Freq: Four times a day (QID) | ORAL | Status: DC | PRN

## 2014-01-10 NOTE — Progress Notes (Signed)
CSW (Clinical Child psychotherapistocial Worker) left 2 messages for facility admissions to inform that pt is being discharged today and to confirm they have a bed available.   Previn Jian, LCSWA [phone]

## 2014-01-10 NOTE — Progress Notes (Signed)
Patient being discharged to [facility] for further [facility]. She is going via non-emergent transport. Family has been notified by CSW.

## 2014-01-10 NOTE — Discharge Summary (Signed)
Physician Discharge Summary  [name]:[id number] DOB: 09-24-1917 DOA: 01/07/2014  PCP: [name], DO  Admit date: 01/07/2014 Discharge date: 01/10/2014  Time spent: greater than 30 minutes  Discharge Diagnoses:  Principal Problem:   Hip fracture, left Active Problems:   ANXIETY STATE NOS   GERD   OSTEOPOROSIS   DEPRESSION, CHRONIC, HX OF   Hypertension   Protein-calorie malnutrition, severe   Dementia   Discharge Condition: stable  Code status: full  Filed Weights   01/09/14 0207  Weight: 39.463 kg (87 lb)    History of present illness:  80 year old female who is otherwise in good physical health, ambulates by herself, was doing yard work and holding on to a tree branch and then that branch broke and patient fell. Patient denied any syncopal episode or losing any consciousness. She fell on her left hip and started having pain in the left hip area. She denied any other injury.  Hip x-ray showed minimally displaced acute intertrochanteric left femur fracture  Hospital Course:  Patient was admitted to hospitalist. Orthopedics consulted. Dr. due to performed surgery on the 19th. He recommended aspirin, TED hose and sequential compression devices for DVT prophylaxis. Patient remains pleasantly confused and has a history of chronic dementia. She has been medically stable and has worked with physical therapy. She will go to skilled nursing facility today.  Procedures:  Left hip IM nail by Dr. Lajoyce Cornersuda 01/08/14  Consultations:  Gaylord ShihrthoLajoyce Corners: Duda  Discharge Exam: Filed Vitals:   01/10/14 0528  BP: 105/58  Pulse: 74  Temp: 98.4 F (36.9 C)  Resp: 18    General: Alert. Confused. Cooperative Cardiovascular: RRR without MGR Respiratory: CTA without WRR Ext no CCE  Discharge Orders   Future Orders Complete By Expires   Diet general  As directed    Walk with assistance  As directed    Weight bearing as tolerated  As directed    Questions:     Laterality:     Extremity:          Medication List    STOP taking these medications       fluocinonide cream 0.05 %  Commonly known as:  LIDEX      TAKE these medications       acetaminophen 325 MG tablet  Commonly known as:  TYLENOL  Take 2 tablets (650 mg total) by mouth every 6 (six) hours as needed.     aspirin EC 325 MG tablet  Take 1 tablet (325 mg total) by mouth daily.     DSS 100 MG Caps  Take 100 mg by mouth 2 (two) times daily.     feeding supplement (ENSURE COMPLETE) Liqd  Take 237 mLs by mouth 2 (two) times daily between meals.     FLUoxetine 20 MG capsule  Commonly known as:  PROZAC  Take 1 capsule (20 mg total) by mouth daily.     HYDROcodone-acetaminophen 5-325 MG per tablet  Commonly known as:  NORCO/VICODIN  Take 1 tablet by mouth every 6 (six) hours as needed for moderate pain.     ondansetron 4 MG tablet  Commonly known as:  ZOFRAN  Take 1 tablet (4 mg total) by mouth every 6 (six) hours as needed for nausea.     pantoprazole 40 MG tablet  Commonly known as:  PROTONIX  Take 1 tablet (40 mg total) by mouth daily.       Allergies  Allergen Reactions  . Codeine     unknown  .  Indomethacin     Unknown. Spoke to granddaughter on 02/05/13 who stated she was not aware of this allergy and that her grandmother (Ashley Burnett) does not remember this allergy either.   . Morphine And Related     unknown       Follow-up Information   Follow up with DUDA,MARCUS V, MD In 2 weeks.   Specialty:  Orthopedic Surgery   Contact information:   [address] [place] [address] [phone]        The results of significant diagnostics from this hospitalization (including imaging, microbiology, ancillary and laboratory) are listed below for reference.    Significant Diagnostic Studies: Dg Chest 1 View  01/07/2014   CLINICAL DATA:  Pre-op respiratory exam for hip fracture.  EXAM: CHEST - 1 VIEW  COMPARISON:  02/04/2013  FINDINGS: Pulmonary hyperinflation again seen,  consistent with COPD. Right lower lung scarring noted. No evidence of pulmonary infiltrate or pleural effusion. Heart size is within normal limits. No mass or lymphadenopathy identified.  IMPRESSION: COPD.  No active disease.   Electronically Signed   By: Myles RosenthalJohn  Stahl M.D.   On: 01/07/2014 19:49   Dg Hip Complete Left  01/07/2014   CLINICAL DATA:  80 year old female status post fall with pain. Initial encounter.  EXAM: LEFT HIP - COMPLETE 2+ VIEW  COMPARISON:  None.  FINDINGS: Lucency through the intertrochanteric region of the proximal left femur consistent with fracture. Left femoral head remains normally located.  Osteopenia. Aortoiliac calcified atherosclerosis noted. No acute fracture of the pelvis identified. Grossly intact proximal right femur.  IMPRESSION: Acute intertrochanteric left femur fracture, minimally displaced.   Electronically Signed   By: [name]   On: 01/07/2014 19:49   Dg Hip Operative Left  01/08/2014   CLINICAL DATA:  Left intertrochanteric hip fracture.  EXAM: OPERATIVE LEFT HIP  COMPARISON:  01/07/2014.  FINDINGS: 2 intraoperative fluoroscopic spot views of the left hip demonstrate interval placement of a gamma nail fixation device, with restoration of anatomic alignment. The device appears properly located, without obvious complicating features. Femoral head appears located on the single view provided.  IMPRESSION: 1. Status post ORIF for intertrochanteric hip fracture, as above.   Electronically Signed   By: Trudie Reedaniel  Entrikin M.D.   On: 01/08/2014 14:10    Microbiology: Recent Results (from the past 240 hour(s))  URINE CULTURE     Status: None   Collection Time    01/07/14 10:09 PM      Result Value Ref Range Status   Specimen Description URINE, CLEAN CATCH   Final   Special Requests NONE   Final   Culture  Setup Time     Final   Value: 01/07/2014 22:26     Performed at [facility]   Colony Count     Final   Value: 40,000 COLONIES/ML     Performed at  [facility]   Culture     Final   Value: Multiple bacterial morphotypes present, none predominant. Suggest appropriate recollection if clinically indicated.     Performed at [facility]   Report Status 01/09/2014 FINAL   Final  SURGICAL PCR SCREEN     Status: Abnormal   Collection Time    01/07/14 10:42 PM      Result Value Ref Range Status   MRSA, PCR NEGATIVE  NEGATIVE Final   Staphylococcus aureus POSITIVE (*) NEGATIVE Final   Comment:  The Xpert SA Assay (FDA     approved for NASAL specimens     in patients over 29 years of age),     is one component of     a comprehensive surveillance     program.  Test performance has     been validated by [employer] for patients greater     than or equal to 46 year old.     It is not intended     to diagnose infection nor to     guide or monitor treatment.     RESULT CALLED TO, READ BACK BY AND VERIFIED WITHCheral Bay RN [phone] GREEN R     Labs: Basic Metabolic Panel:  Recent Labs Lab 01/07/14 1943 01/08/14 0500 01/09/14 0405 01/10/14 0530  NA 131* 133* 137 136*  K 4.2 4.2 3.9 4.1  CL 94* 97 101 99  CO2 22 21 27 26   GLUCOSE 105* 123* 104* 122*  BUN 14 11 12 17   CREATININE 0.43* 0.39* 0.50 0.37*  CALCIUM 9.3 8.9 8.3* 8.4   Liver Function Tests:  Recent Labs Lab 01/07/14 1943  AST 22  ALT 16  ALKPHOS 79  BILITOT 0.5  PROT 7.1  ALBUMIN 3.6   No results found for this basename: LIPASE, AMYLASE,  in the last 168 hours No results found for this basename: AMMONIA,  in the last 168 hours CBC:  Recent Labs Lab 01/07/14 1943 01/08/14 0500 01/09/14 0405 01/10/14 0530  WBC 11.7* 10.0 6.0 9.0  NEUTROABS 8.7*  --   --   --   HGB 13.2 12.9 10.3* 10.5*  HCT 37.7 37.9 29.9* 31.4*  MCV 86.5 87.3 87.4 88.7  PLT 260 241 188 209   Cardiac Enzymes: No results found for this basename: CKTOTAL, CKMB, CKMBINDEX, TROPONINI,  in the last 168 hours BNP: BNP (last 3 results) No  results found for this basename: PROBNP,  in the last 8760 hours CBG: No results found for this basename: GLUCAP,  in the last 168 hours  Signed:  Christiane Ha, M.D.  [facility] 01/10/2014, 12:33 PM

## 2014-01-10 NOTE — Evaluation (Signed)
Occupational Therapy Evaluation Patient Details Name: Bing ReeJoy Lanigan MRN: [id number] DOB: [birth year] Today's Date: 01/10/2014    History of Present Illness Pt presents with L hip fx s/p IM nail.     Clinical Impression   Pt is a 80 y/o female s/p L hip fx w/ IM nail. She has a h/o cognitive deficits. She currently demonstrates h/o falls & impairment in her ability to perfrom functional mobility/transfers and ADL's safely. She currently requires increased time for all activity as well as +2 assist. She will benefit from SNF Rehab and 24 assist PRN. Defer further OT to SNF Rehab.    Follow Up Recommendations  SNF;Supervision/Assistance - 24 hour    Equipment Recommendations  Other (comment) (Defer to next venue.)    Recommendations for Other Services       Precautions / Restrictions Precautions Precautions: Fall Restrictions Weight Bearing Restrictions: Yes LLE Weight Bearing: Weight bearing as tolerated      Mobility Bed Mobility Overal bed mobility: Needs Assistance;+2 for physical assistance Bed Mobility: Supine to Sit     Supine to sit: Mod assist;HOB elevated;+2 for physical assistance     General bed mobility comments: cues for sequencing and encouragement.  pt progresses nicely w/ increased time and repetition of vc's, prefers to attempt on her own.  Transfers Overall transfer level: Needs assistance Equipment used: 2 person hand held assist Transfers: Sit to/from Stand Sit to Stand: +2 physical assistance;Max assist         General transfer comment: Pt able to perform sit to stand followed by SPT from EOB to chair. She moves slowly and prefers to attempt on her own, is fearful/anxious. Requires vc's for safety and sequencing during transfers.    Balance Overall balance assessment: History of Falls;Needs assistance Sitting-balance support: Feet supported;Bilateral upper extremity supported Sitting balance-Leahy Scale: Fair     Standing balance support:  Bilateral upper extremity supported Standing balance-Leahy Scale: Poor Standing balance comment: Pt leans and reaching for objects in room, requires verbal and tactile cues for safety and sequencing as well as verbal encouragement to participate. Decreased safety awareness, awareness of deficits "I thought I'd just stand up and slide over to the chair."                            ADL Overall ADL's : Needs assistance/impaired Eating/Feeding: Set up;Sitting   Grooming: Wash/dry hands;Set up;Sitting   Upper Body Bathing: Set up;Sitting (Simulated)   Lower Body Bathing: +2 for physical assistance;Total assistance   Upper Body Dressing : Minimal assistance;Sitting   Lower Body Dressing: Total assistance;+2 for physical assistance;Sit to/from stand   Toilet Transfer: Total assistance;+2 for physical assistance;Stand-pivot;BSC (Simulated) Toilet Transfer Details (indicate cue type and reason): Pt performed SPT from EOB to chair given increased time and vc's as well as physical assistance.  Toileting- Clothing Manipulation and Hygiene: Total assistance;+2 for physical assistance;Sit to/from stand       Functional mobility during ADLs: Total assistance;+2 for physical assistance;Cueing for safety;Cueing for sequencing General ADL Comments:  (Pt performed simple gromming task and functional mobility transfers given increased time and +2 total assist. Pt requires frequent re-direction to tasks and cuing secondary to dementia and STM deficits.)     Vision  Wears glasses all the time, unable to state if she has deficits. Cont to assess in functional context.                   Perception  Praxis      Pertinent Vitals/Pain Pt reports L hip pain w/ activity, however is unable to rate. Pt performed facial grimace w/ activity. Ice applied to left hip in sitting, feet elevated and pt resting at conclusion of session.     Hand Dominance Right   Extremity/Trunk Assessment  Upper Extremity Assessment Upper Extremity Assessment: Generalized weakness   Lower Extremity Assessment Lower Extremity Assessment: Defer to PT evaluation       Communication Communication Communication: No difficulties   Cognition Arousal/Alertness: Awake/alert Behavior During Therapy: WFL for tasks assessed/performed Overall Cognitive Status: History of cognitive impairments - at baseline       Memory: Decreased short-term memory             General Comments       Exercises       Shoulder Instructions      Home Living Family/patient expects to be discharged to:: Skilled nursing facility Living Arrangements: Other (Comment) (Has aide)                                      Prior Functioning/Environment Level of Independence: Independent             OT Diagnosis: Generalized weakness;Acute pain;Cognitive deficits   OT Problem List: Decreased strength;Decreased activity tolerance;Impaired balance (sitting and/or standing);Decreased knowledge of use of DME or AE;Decreased safety awareness;Decreased cognition;Pain   OT Treatment/Interventions:      OT Goals(Current goals can be found in the care plan section) Acute Rehab OT Goals Patient Stated Goal: To go home OT Goal Formulation: Patient unable to participate in goal setting  OT Frequency:     Barriers to D/C:            Co-evaluation              End of Session Equipment Utilized During Treatment: Gait belt;Other (comment) (2 person physical assist) Nurse Communication: Mobility status;Other (comment) (Pt eating lunch)  Activity Tolerance: Patient tolerated treatment well Patient left: in chair;with call bell/phone within reach;Other (comment) (Door open, near RN station, RN staff aware pt is up in chair.)   Time: [phone] OT Time Calculation (min): 27 min Charges:  OT General Charges $OT Visit: 1 Procedure OT Evaluation $Initial OT Evaluation Tier I: 1 Procedure OT  Treatments $Self Care/Home Management : 8-22 mins $Therapeutic Activity: 8-22 mins G-Codes:    Jeshua Ransford B Emunah [place] 01/10/2014, 1:01 PM

## 2014-01-10 NOTE — Progress Notes (Signed)
CSW (Clinical Child psychotherapistocial Worker) prepared pt dc packet and placed with shadow chart. CSW arranged non-emergent ambulance transport at 2:30pm. Pt, pt family, pt nurse, and facility informed. CSW signing off.  Nathanyl Andujo, LCSWA [phone]

## 2014-03-22 DIAGNOSIS — D500 Iron deficiency anemia secondary to blood loss (chronic): Secondary | ICD-10-CM

## 2014-03-22 DIAGNOSIS — K922 Gastrointestinal hemorrhage, unspecified: Secondary | ICD-10-CM

## 2014-03-22 HISTORY — DX: Iron deficiency anemia secondary to blood loss (chronic): D50.0

## 2014-03-22 HISTORY — DX: Gastrointestinal hemorrhage, unspecified: K92.2

## 2014-04-19 ENCOUNTER — Encounter (HOSPITAL_COMMUNITY): Payer: Self-pay | Admitting: Emergency Medicine

## 2014-04-19 ENCOUNTER — Inpatient Hospital Stay (HOSPITAL_COMMUNITY)
Admission: EM | Admit: 2014-04-19 | Discharge: 2014-04-22 | DRG: 377 | Disposition: A | Discharge status: Home / Self Care | Payer: Medicare Other | Attending: Internal Medicine | Admitting: Internal Medicine

## 2014-04-19 ENCOUNTER — Emergency Department (HOSPITAL_COMMUNITY): Payer: Medicare Other

## 2014-04-19 DIAGNOSIS — R630 Anorexia: Secondary | ICD-10-CM | POA: Diagnosis present

## 2014-04-19 DIAGNOSIS — T3995XA Adverse effect of unspecified nonopioid analgesic, antipyretic and antirheumatic, initial encounter: Secondary | ICD-10-CM

## 2014-04-19 DIAGNOSIS — D62 Acute posthemorrhagic anemia: Secondary | ICD-10-CM | POA: Diagnosis present

## 2014-04-19 DIAGNOSIS — M129 Arthropathy, unspecified: Secondary | ICD-10-CM | POA: Diagnosis present

## 2014-04-19 DIAGNOSIS — Z9049 Acquired absence of other specified parts of digestive tract: Secondary | ICD-10-CM | POA: Diagnosis not present

## 2014-04-19 DIAGNOSIS — T39395A Adverse effect of other nonsteroidal anti-inflammatory drugs [NSAID], initial encounter: Secondary | ICD-10-CM

## 2014-04-19 DIAGNOSIS — Z8673 Personal history of transient ischemic attack (TIA), and cerebral infarction without residual deficits: Secondary | ICD-10-CM | POA: Diagnosis not present

## 2014-04-19 DIAGNOSIS — F329 Major depressive disorder, single episode, unspecified: Secondary | ICD-10-CM | POA: Diagnosis present

## 2014-04-19 DIAGNOSIS — R578 Other shock: Secondary | ICD-10-CM | POA: Diagnosis present

## 2014-04-19 DIAGNOSIS — K219 Gastro-esophageal reflux disease without esophagitis: Secondary | ICD-10-CM | POA: Diagnosis present

## 2014-04-19 DIAGNOSIS — R0609 Other forms of dyspnea: Secondary | ICD-10-CM

## 2014-04-19 DIAGNOSIS — Z681 Body mass index (BMI) 19 or less, adult: Secondary | ICD-10-CM

## 2014-04-19 DIAGNOSIS — M810 Age-related osteoporosis without current pathological fracture: Secondary | ICD-10-CM | POA: Diagnosis present

## 2014-04-19 DIAGNOSIS — K922 Gastrointestinal hemorrhage, unspecified: Secondary | ICD-10-CM | POA: Diagnosis present

## 2014-04-19 DIAGNOSIS — F3289 Other specified depressive episodes: Secondary | ICD-10-CM | POA: Diagnosis present

## 2014-04-19 DIAGNOSIS — R1032 Left lower quadrant pain: Secondary | ICD-10-CM

## 2014-04-19 DIAGNOSIS — Z7982 Long term (current) use of aspirin: Secondary | ICD-10-CM | POA: Diagnosis not present

## 2014-04-19 DIAGNOSIS — Z789 Other specified health status: Secondary | ICD-10-CM

## 2014-04-19 DIAGNOSIS — K5731 Diverticulosis of large intestine without perforation or abscess with bleeding: Secondary | ICD-10-CM | POA: Diagnosis present

## 2014-04-19 DIAGNOSIS — F0391 Unspecified dementia with behavioral disturbance: Secondary | ICD-10-CM

## 2014-04-19 DIAGNOSIS — F0390 Unspecified dementia without behavioral disturbance: Secondary | ICD-10-CM | POA: Diagnosis present

## 2014-04-19 DIAGNOSIS — Z66 Do not resuscitate: Secondary | ICD-10-CM | POA: Diagnosis present

## 2014-04-19 DIAGNOSIS — T398X5A Adverse effect of other nonopioid analgesics and antipyretics, not elsewhere classified, initial encounter: Secondary | ICD-10-CM

## 2014-04-19 DIAGNOSIS — R52 Pain, unspecified: Secondary | ICD-10-CM

## 2014-04-19 DIAGNOSIS — E7800 Pure hypercholesterolemia, unspecified: Secondary | ICD-10-CM | POA: Diagnosis present

## 2014-04-19 DIAGNOSIS — K5730 Diverticulosis of large intestine without perforation or abscess without bleeding: Secondary | ICD-10-CM

## 2014-04-19 DIAGNOSIS — E43 Unspecified severe protein-calorie malnutrition: Secondary | ICD-10-CM | POA: Diagnosis present

## 2014-04-19 HISTORY — DX: Gastrointestinal hemorrhage, unspecified: K92.2

## 2014-04-19 HISTORY — DX: Iron deficiency anemia secondary to blood loss (chronic): D50.0

## 2014-04-19 LAB — CBC WITH DIFFERENTIAL/PLATELET
BASOS PCT: 0 % (ref 0–1)
Basophils Absolute: 0 10*3/uL (ref 0.0–0.1)
EOS ABS: 0.3 10*3/uL (ref 0.0–0.7)
Eosinophils Relative: 2 % (ref 0–5)
HCT: 32.7 % — ABNORMAL LOW (ref 36.0–46.0)
Hemoglobin: 10.9 g/dL — ABNORMAL LOW (ref 12.0–15.0)
Lymphocytes Relative: 15 % (ref 12–46)
Lymphs Abs: 2.6 10*3/uL (ref 0.7–4.0)
MCH: 29.3 pg (ref 26.0–34.0)
MCHC: 33.3 g/dL (ref 30.0–36.0)
MCV: 87.9 fL (ref 78.0–100.0)
Monocytes Absolute: 1.6 10*3/uL — ABNORMAL HIGH (ref 0.1–1.0)
Monocytes Relative: 10 % (ref 3–12)
NEUTROS PCT: 73 % (ref 43–77)
Neutro Abs: 12.2 10*3/uL — ABNORMAL HIGH (ref 1.7–7.7)
PLATELETS: 307 10*3/uL (ref 150–400)
RBC: 3.72 MIL/uL — ABNORMAL LOW (ref 3.87–5.11)
RDW: 16.1 % — ABNORMAL HIGH (ref 11.5–15.5)
WBC: 16.7 10*3/uL — ABNORMAL HIGH (ref 4.0–10.5)

## 2014-04-19 LAB — I-STAT CHEM 8, ED
BUN: 26 mg/dL — ABNORMAL HIGH (ref 6–23)
Calcium, Ion: 1.17 mmol/L (ref 1.13–1.30)
Chloride: 98 mEq/L (ref 96–112)
Creatinine, Ser: 0.8 mg/dL (ref 0.50–1.10)
GLUCOSE: 151 mg/dL — AB (ref 70–99)
HEMATOCRIT: 36 % (ref 36.0–46.0)
HEMOGLOBIN: 12.2 g/dL (ref 12.0–15.0)
POTASSIUM: 4.2 meq/L (ref 3.7–5.3)
SODIUM: 130 meq/L — AB (ref 137–147)
TCO2: 21 mmol/L (ref 0–100)

## 2014-04-19 LAB — TROPONIN I: Troponin I: <0.3 ng/mL (ref <0.30)

## 2014-04-19 LAB — MRSA PCR SCREENING: MRSA by PCR: NEGATIVE

## 2014-04-19 LAB — URINALYSIS, ROUTINE W REFLEX MICROSCOPIC
GLUCOSE, UA: 100 mg/dL — AB
Ketones, ur: 15 mg/dL — AB
Nitrite: NEGATIVE
PH: 5 (ref 5.0–8.0)
Protein, ur: 30 mg/dL — AB
Specific Gravity, Urine: 1.021 (ref 1.005–1.030)
Urobilinogen, UA: 0.2 mg/dL (ref 0.0–1.0)

## 2014-04-19 LAB — CBC
HCT: 25.6 % — ABNORMAL LOW (ref 36.0–46.0)
HCT: 27.1 % — ABNORMAL LOW (ref 36.0–46.0)
Hemoglobin: 8.9 g/dL — ABNORMAL LOW (ref 12.0–15.0)
Hemoglobin: 9.5 g/dL — ABNORMAL LOW (ref 12.0–15.0)
MCH: 30.5 pg (ref 26.0–34.0)
MCH: 30.3 pg (ref 26.0–34.0)
MCHC: 34.8 g/dL (ref 30.0–36.0)
MCHC: 35.1 g/dL (ref 30.0–36.0)
MCV: 87.7 fL (ref 78.0–100.0)
MCV: 86.3 fL (ref 78.0–100.0)
Platelets: 283 10*3/uL (ref 150–400)
Platelets: 251 10*3/uL (ref 150–400)
RBC: 2.92 MIL/uL — ABNORMAL LOW (ref 3.87–5.11)
RBC: 3.14 MIL/uL — ABNORMAL LOW (ref 3.87–5.11)
RDW: 15.6 % — AB (ref 11.5–15.5)
RDW: 15.7 % — AB (ref 11.5–15.5)
WBC: 9.5 10*3/uL (ref 4.0–10.5)
WBC: 9.5 10*3/uL (ref 4.0–10.5)

## 2014-04-19 LAB — PROTIME-INR
INR: 1.05 (ref 0.00–1.49)
INR: 1.22 (ref 0.00–1.49)
PROTHROMBIN TIME: 13.7 s (ref 11.6–15.2)
Prothrombin Time: 15.4 seconds — ABNORMAL HIGH (ref 11.6–15.2)

## 2014-04-19 LAB — URINE MICROSCOPIC-ADD ON

## 2014-04-19 LAB — PREPARE RBC (CROSSMATCH)

## 2014-04-19 LAB — LIPASE, BLOOD: Lipase: 34 U/L (ref 11–59)

## 2014-04-19 LAB — POC OCCULT BLOOD, ED: Fecal Occult Bld: POSITIVE — AB

## 2014-04-19 LAB — GLUCOSE, CAPILLARY: GLUCOSE-CAPILLARY: 194 mg/dL — AB (ref 70–99)

## 2014-04-19 MED ORDER — PANTOPRAZOLE SODIUM 40 MG IV SOLR: 40.0000 mg | Freq: Two times a day (BID) | INTRAVENOUS | Status: DC

## 2014-04-19 MED ORDER — SODIUM CHLORIDE 0.9 % IV SOLN
INTRAVENOUS | Status: DC
Start: 2014-04-19 — End: 2014-04-22
  Administered 2014-04-19 – 2014-04-21 (×4): via INTRAVENOUS

## 2014-04-19 MED ORDER — SODIUM CHLORIDE 0.9 % IV BOLUS (SEPSIS)
1000.0000 mL | Freq: Once | INTRAVENOUS | Status: AC
  Administered 2014-04-19: 1000 mL via INTRAVENOUS

## 2014-04-19 MED ORDER — SODIUM CHLORIDE 0.9 % IV SOLN
80.0000 mg | Freq: Once | INTRAVENOUS | Status: AC
  Filled 2014-04-19: qty 80

## 2014-04-19 MED ORDER — SODIUM CHLORIDE 0.9 % IV SOLN: 250.0000 mL | INTRAVENOUS | Status: DC | PRN

## 2014-04-19 MED ORDER — PANTOPRAZOLE SODIUM 40 MG IV SOLR
40.0000 mg | Freq: Two times a day (BID) | INTRAVENOUS | Status: DC
  Administered 2014-04-20: 40 mg via INTRAVENOUS
  Filled 2014-04-19 (×3): qty 40

## 2014-04-19 MED ORDER — CHLORHEXIDINE GLUCONATE 0.12 % MT SOLN
15.0000 mL | Freq: Two times a day (BID) | OROMUCOSAL | Status: DC
  Administered 2014-04-20: 15 mL via OROMUCOSAL
  Filled 2014-04-19: qty 15

## 2014-04-19 MED ORDER — CETYLPYRIDINIUM CHLORIDE 0.05 % MT LIQD
7.0000 mL | Freq: Two times a day (BID) | OROMUCOSAL | Status: DC
  Administered 2014-04-19 – 2014-04-20 (×2): 7 mL via OROMUCOSAL
  Filled 2014-04-19 (×3): qty 7

## 2014-04-19 MED ORDER — SODIUM CHLORIDE 0.9 % IV SOLN
8.0000 mg/h | INTRAVENOUS | Status: DC
  Administered 2014-04-19: 8 mg/h via INTRAVENOUS
  Filled 2014-04-19 (×2): qty 80

## 2014-04-19 MED ORDER — PANTOPRAZOLE SODIUM 40 MG IV SOLR
40.0000 mg | Freq: Once | INTRAVENOUS | Status: AC
Start: 2014-04-19 — End: 2014-04-19
  Administered 2014-04-19: 40 mg via INTRAVENOUS
  Filled 2014-04-19: qty 40

## 2014-04-19 MED ORDER — SODIUM CHLORIDE 0.9 % IV SOLN
80.0000 mg | Freq: Once | INTRAVENOUS | Status: DC
  Filled 2014-04-19 (×2): qty 80

## 2014-04-19 NOTE — ED Notes (Signed)
Gastro PA at bedside talking with family

## 2014-04-19 NOTE — H&P (Signed)
PULMONARY / CRITICAL CARE MEDICINE   Name: Ashley Burnett MRN: [id number] DOB: 07-Jan-1918    ADMISSION DATE:  04/19/2014 CONSULTATION DATE:  04/19/2014  REFERRING MD :  Nicanor AlconPalumbo  CHIEF COMPLAINT:  GI bleed  INITIAL PRESENTATION: 80 y/o female with know diverticular disease on full dose aspirin at home [facility] 7/29 early [facility] with bright red blood per rectum.  STUDIES:    SIGNIFICANT EVENTS:   HISTORY OF PRESENT ILLNESS:  80 y/o female with know diverticular disease on full dose aspirin at home [facility] 7[facility]29 early [facility] with bright red blood per rectum.  Her family provides the history due to dementia.  She went to bed yesterday complaining of epigastric pain and nausea but never vomited.  Around 3 AM she complained of more abdominal pain so her caregiver gave her pepto-bismol.  Then at 0530 she was found in her bathroom in a pool of bright red blood.  Again, no vomiting noted.  She was brought to the ED and found to have low BP and was given crystalloid.  She has had two more maroon colored bowel [facility].  She is still complaining of left lower quadrant pain.  PAST MEDICAL HISTORY :  Past Medical History  Diagnosis Date  . Diverticulosis     universal,severe  . Family history of GERD   . Osteoporosis   . Transient ischemic attack   . Fracture     leg wrist  . High cholesterol   . Depression   . Gastritis   . Arthritis   . Dementia    Past Surgical History  Procedure Laterality Date  . Abdominal hysterectomy    . Laparotomy  1960    SBO  . Ovarian cyst removal    . Tonsillectomy    . Appendectomy    . Abdominal adhesion surgery  1993    SBO   . Laparotomy N/A 02/09/2013    Procedure: EXPLORATORY LAPAROTOMY;  Surgeon: Emelia LoronMatthew Wakefield, MD;  [facility];  Service: General;  Laterality: N/A;  . Colon resection N/A 02/09/2013    Procedure: COLON RESECTION;  Surgeon: Emelia LoronMatthew Wakefield, MD;  [facility];  Service: General;   Laterality: N/A;  . Intramedullary (im) nail intertrochanteric Left 01/08/2014    Procedure: INTRAMEDULLARY (IM) NAIL INTERTROCHANTRIC;  Surgeon: Nadara MustardMarcus V Duda, MD;  Location: [facility] OR;  Service: Orthopedics;  Laterality: Left;   Prior to Admission medications   Medication Sig Start Date End Date Taking? Authorizing Provider  acetaminophen (TYLENOL) 325 MG tablet Take 2 tablets (650 mg total) by mouth every 6 (six) hours as needed. 02/17/13  Yes Doristine MangoElizabeth White, PA-C  diphenhydrAMINE (BENADRYL) 25 mg capsule Take 25 mg by mouth 2 (two) times daily as needed for allergies.   Yes Historical Provider, MD  FLUoxetine (PROZAC) 20 MG capsule Take 1 capsule (20 mg total) by mouth daily. 02/17/13  Yes Doristine MangoElizabeth White, PA-C  hydrOXYzine (VISTARIL) 100 MG capsule Take 100-200 mg by mouth at bedtime as needed for anxiety.   Yes Historical Provider, MD  Multiple Vitamin (MULTIVITAMIN WITH MINERALS) TABS tablet Take 1 tablet by mouth daily. Centrum sliver   Yes Historical Provider, MD  feeding supplement (ENSURE COMPLETE) LIQD Take 237 mLs by mouth 2 (two) times daily between meals. 02/17/13   Doristine MangoElizabeth White, PA-C   Allergies  Allergen Reactions  . Codeine     unknown  . Indomethacin     Unknown. Spoke to  granddaughter on 02/05/13 who stated she was not aware of this allergy and that her grandmother (Ashley Burnett) does not remember this allergy either.   . Morphine And Related     unknown    FAMILY HISTORY: SOCIAL HISTORY:REVIEW OF SYSTEMS:  Cannot obtain due to dementia  SUBJECTIVE:   VITAL SIGNS: Temp:  [97.6 F (36.4 C)] 97.6 F (36.4 C) (07/29 0557) Pulse Rate:  [76-89] 81 (07/29 0730) Resp:  [14-23] 21 (07/29 0730) BP: (89-126)/(49-66) 113/49 mmHg (07/29 0730) SpO2:  [94 %-98 %] 98 % (07/29 0730) Weight:  [39.917 kg (88 lb)] 39.917 kg (88 lb) (07/29 0557) HEMODYNAMICS:   VENTILATOR SETTINGS:   INTAKE / OUTPUT: No intake or output data in the 24 hours ending 04/19/14 0753  PHYSICAL  EXAMINATION: Gen: pale appearing elderly female, no acute distress HEENT: NCAT, PERRL, EOMi, OP clear, neck supple without masses PULM: CTA B CV: RRR, systolic murmur noted LUSB, no JVD AB: BS+, soft, mildly tender, no hsm Ext: warm, no edema, no clubbing, no cyanosis Derm: no rash or skin breakdown Neuro: Awake and alert, moves all four ext, confused, not oriented to place or situation   LABS:  CBC  Recent Labs Lab 04/19/14 0609 04/19/14 0612  WBC  --  16.7*  HGB 12.2 10.9*  HCT 36.0 32.7*  PLT  --  307   Coag's  Recent Labs Lab 04/19/14 0640  INR 1.05   BMET  Recent Labs Lab 04/19/14 0609  NA 130*  K 4.2  CL 98  BUN 26*  CREATININE 0.80  GLUCOSE 151*   Electrolytes No results found for this basename: CALCIUM, MG, PHOS,  in the last 168 hours Sepsis Markers No results found for this basename: LATICACIDVEN, PROCALCITON, O2SATVEN,  in the last 168 hours ABG No results found for this basename: PHART, PCO2ART, PO2ART,  in the last 168 hours Liver Enzymes No results found for this basename: AST, ALT, ALKPHOS, BILITOT, ALBUMIN,  in the last 168 hours Cardiac Enzymes No results found for this basename: TROPONINI, PROBNP,  in the last 168 hours Glucose No results found for this basename: GLUCAP,  in the last 168 hours  Imaging No results found.   ASSESSMENT / PLAN:  GASTROINTESTINAL A:  Acute GI bleeding, suspect lower GI bleed as complaining of LLQ pain and has history of diverticulosis; peptic ulcer disease in ddx considering epigastric pain and ASA use; consider ischemic colitis given pain P:   F/u lactic acid Ashley Burnett GI medicine consulted NPO PPI gtt  HEMATOLOGIC A:  Hemorrhage as described above, receiving PRBC and PLT transfusion in ED On full dose ASA > likely inhibiting platelets P:  q6h cbc Transfusion threshold Hgb < 8 gm/dL after initial transfusion  PULMONARY OETT N/A A: No acute issues P:   monitor O2 sat  CARDIOVASCULAR CVL  N/A A: Hemorrhagic shock P:  transfuse PRBC now NS at 100cc/hr tele  RENAL A:  No acute issues P:   Monitor UOP/BMET  INFECTIOUS A:  No acute issues P:   Monitor for fever  ENDOCRINE A:  No acute issues P:   Monitor glucose  NEUROLOGIC A:  Dementia History of sundowning: no doubt perpetuated by multiple anti-histamines from home P:   Hold home anti-histamines Frequent orientation Allow caregiver in room as much as possible Minimize sedating meds Considering changing home sleep aid to trazodone  TODAY'S SUMMARY: 80 y/o female with likely an acute lower GI bleed from diverticular disease; BP low, transfusion in progress  CODE:  DNR  Family: updated granddaughter at bedside, daughter, Ashley Burnett (also named Ashley Burnett) is on way into hospital  I have personally obtained a history, examined the patient, evaluated laboratory and imaging results, formulated the assessment and plan and placed orders. CRITICAL CARE: The patient is critically ill with multiple organ systems failure and requires high complexity decision making for assessment and support, frequent evaluation and titration of therapies, application of advanced monitoring technologies and extensive interpretation of multiple databases. Critical Care Time devoted to patient care services described in this note is 45 minutes.    Heber Wedgewood, MD [facility] Pager: [phone] Cell: [phone] If no response, call [phone]   04/19/2014, 7:53 AM

## 2014-04-19 NOTE — Progress Notes (Signed)
Deitra MayoSarah Gribbins, PA, at bedside. Orders received to hold protonix bolus, increase drip from 8mg /hr to 16mg /hr, and will then plan to transition to protonix IVP bid regimen. Orders repeated back to provider. 04/19/2014 Eileen Stanfordarter, Terion Hedman 1:41 PM

## 2014-04-19 NOTE — ED Provider Notes (Signed)
Medical screening examination/treatment/procedure(s) were conducted as a shared visit with non-physician practitioner(s) and myself.  I personally evaluated the patient during the encounter.   EKG Interpretation   Date/Time:  Wednesday April 19 2014 05:59:22 EDT Ventricular Rate:  87 PR Interval:  144 QRS Duration: 68 QT Interval:  358 QTC Calculation: 431 R Axis:   -41 Text Interpretation:  Sinus rhythm Left axis deviation Low voltage,  precordial leads Confirmed by [facility]  MD, Saydie Gerdts ([id number]) on  04/19/2014 6:04:03 AM     PAle NCAT PERRL RRR nl s1s2 NABS abdomen is tender No edema   PPI initiated and blood begun Medications  0.9 %  sodium chloride infusion (not administered)  0.9 %  sodium chloride infusion ( Intravenous New Bag/Given 04/19/14 1820)  pantoprazole (PROTONIX) injection 40 mg (not administered)  chlorhexidine (PERIDEX) 0.12 % solution 15 mL (0 mLs Mouth Rinse Hold 04/19/14 2045)  antiseptic oral rinse (CPC / CETYLPYRIDINIUM CHLORIDE 0.05%) solution 7 mL (7 mLs Mouth Rinse Given 04/19/14 2200)  sodium chloride 0.9 % bolus 1,000 mL (0 mLs Intravenous Stopped 04/19/14 0848)  sodium chloride 0.9 % bolus 1,000 mL (0 mLs Intravenous Stopped 04/19/14 0848)  pantoprazole (PROTONIX) 80 mg in sodium chloride 0.9 % 100 mL IVPB (0 mg Intravenous Duplicate 04/19/14 0745)  pantoprazole (PROTONIX) injection 40 mg (40 mg Intravenous Given 04/19/14 1806)   CRITICAL CARE Performed by: Jasmine AwePALUMBO-RASCH,Ryker Sudbury K Total critical care time: 31 minutes Critical care time was exclusive of separately billable procedures and treating other patients. Critical care was necessary to treat or prevent imminent or life-threatening deterioration. Critical care was time spent personally by me on the following activities: development of treatment plan with patient and/or surrogate as well as nursing, discussions with consultants, evaluation of patient's response to treatment, examination of patient,  obtaining history from patient or surrogate, ordering and performing treatments and interventions, ordering and review of laboratory studies, ordering and review of radiographic studies, pulse oximetry and re-evaluation of patient's condition.   Jasmine AweApril K Wardell Pokorski-Rasch, MD 04/19/14 2314

## 2014-04-19 NOTE — ED Notes (Signed)
Pt transported to 18M floor with RBC & Platelets transfusing

## 2014-04-19 NOTE — Progress Notes (Signed)
2nd of 2 PRBCs transfusing. Received the packet of Platelets.  On PPI drip but not bolused with Protonix. No bleeding since transfer to ICU this AM. BP has stableized.   rn to scan urine bladder as no urine output since arrival to ICU.   Plan ? pursue RBC scan ? Asked nurse to increase the current rate of PPI drip so she gets 16mg /hour until infusion completes this afternoon.  Then start BID Protonix later this PM  Jennye MoccasinSarah Faisal Stradling.

## 2014-04-19 NOTE — Progress Notes (Signed)
Craig GuessSarah Gibbons, PA, at bedside. Orders received to hold protonix bolus, increase drip from 8mg /hr to 16mg /hr, and will then plan to transition to protonix IVP bid regimen. Orders repeated back to provider. 04/19/2014 Eileen Stanfordarter, Ramatoulaye Pack 1:37 PM

## 2014-04-19 NOTE — ED Notes (Signed)
Pt brought to the ED from home by [facility] c/o 6/10 ABD pain and having some bloody diarrhea. On EMS arrival pt had some bloody stool that ems describe as pt passing 5-6 blood clots on the stool, pt states she is been having some nausea but denies vomiting. Pt has a mas on her LLQ that is tender to touch.

## 2014-04-19 NOTE — Consult Note (Signed)
[facility] Consult: 8:12 AM 04/19/2014  LOS: 0 days    Referring Provider:  CCM.  Dr Kendrick Fries.   Primary Care Physician:  Loreen Freud, DO Primary Gastroenterologist:  Dr. Sheryn Bison DTR:  Phyillis Dascoli cell [phone], work [phone] daughter: Lynn Ito [phone] cell     Reason for Consultation:  Lower GI bleed.    HPI: Ashley Burnett is a 80 y.o. female.  Has dementia.  S/p multiple abd/pelvic surgeries, surgery for SBO in 1993 and 2014.  S/p 01/08/14 nail fixation of left intertrochanteric hip fracture.  Hx chronic constipation. Seen on SBFT 2001.  Colonoscopy 2011 with dense, large mouthed, pan diverticulosis.  Hx recurrent diverticulitis. S/p 02/09/13 ex lap with LOA and SB resection (10 cm) with primary anastomosis due to adhesive SBO.  Had gastritis on 2008 EGD with empiric dilation of esophagus for pt c/o of dys/odynophagia but no stricture seen.   Admitted yesterday after found at 0530 in bathroom "in pool of blood" in bathroom.     Yesterday evening, several hours before,  she c/o abdominal pain and caregiver gave pt Pepto Bismal.  She has had at least 5 episodes of large, bloody, clotted stools, a least  3 in ED.  BPs low.  C/o pain in lower abdomen.  Pt is c/o nausea and at times seems ready to retch and vomit but no emesis or retching so far. BPs as low as 80s, 90s systolic.  Hgb baseline ~ 13. (though 10.5 in 12/2013 following 10.5).  12.2 yesterday, 10.9 this AM. Transfused one PRBC thus far.  Coags normal.  BUN is 29, creatinine normal.  Previous BUN of 12 - 17.  On no platelet or blood thinner meds. Does take daily full dose ASA. Not on PPI.    No prior lower GI bleeds.  Appetite poor.  Wt 98 pre hip fracture 12/2013, last week 88 #.  Family feels she is going down hill.  Dementia is  variable.  Mostly recognizes family.  Short term memory is very impaired.  Ambulates slowly, carefully with cane.  24/7 care givers.   [facility]charged [facility].   Skin on forearms is itchy but no rash. Takes Benadyl prn and Vistaril prn.  Has not had need of laxatives.  Bowels move nearly daily.     Past Medical History  Diagnosis Date  . Diverticulosis     universal,severe  . Family history of GERD   . Osteoporosis   . Transient ischemic attack   . Fracture     leg wrist  . High cholesterol   . Depression   . Gastritis   . Arthritis   . Dementia     Past Surgical History  Procedure Laterality Date  . Abdominal hysterectomy    . Laparotomy  1960    SBO  . Ovarian cyst removal    . Tonsillectomy    . Appendectomy    . Abdominal adhesion surgery  1993    SBO   . Laparotomy N/A 02/09/2013  Procedure: EXPLORATORY LAPAROTOMY;  Surgeon: Emelia LoronMatthew Wakefield, MD;  [facility];  Service: General;  Laterality: N/A;  . Colon resection N/A 02/09/2013    Procedure: COLON RESECTION;  Surgeon: Emelia LoronMatthew Wakefield, MD;  [facility];  Service: General;  Laterality: N/A;  . Intramedullary (im) nail intertrochanteric Left 01/08/2014    Procedure: INTRAMEDULLARY (IM) NAIL INTERTROCHANTRIC;  Surgeon: Nadara MustardMarcus V Duda, MD;  Location: [facility] OR;  Service: Orthopedics;  Laterality: Left;    Prior to Admission medications   Medication Sig Start Date End Date Taking? Authorizing Provider  acetaminophen (TYLENOL) 325 MG tablet Take 2 tablets (650 mg total) by mouth every 6 (six) hours as needed. 02/17/13  Yes Doristine MangoElizabeth White, PA-C  diphenhydrAMINE (BENADRYL) 25 mg capsule Take 25 mg by mouth 2 (two) times daily as needed for allergies.   Yes Historical Provider, MD  FLUoxetine (PROZAC) 20 MG capsule Take 1 capsule (20 mg total) by mouth daily. 02/17/13  Yes Doristine MangoElizabeth White, PA-C  hydrOXYzine (VISTARIL) 100 MG capsule Take 100-200 mg by mouth at bedtime as needed for anxiety.   Yes  Historical Provider, MD  Multiple Vitamin (MULTIVITAMIN WITH MINERALS) TABS tablet Take 1 tablet by mouth daily. Centrum sliver   Yes Historical Provider, MD  feeding supplement (ENSURE COMPLETE) LIQD Take 237 mLs by mouth 2 (two) times daily between meals. 02/17/13   Doristine MangoElizabeth White, PA-C    Scheduled Meds:   Infusions: . pantoprazole (PROTONIX) IV     PRN Meds:    Allergies as of 04/19/2014 - Review Complete 04/19/2014  Allergen Reaction Noted  . Codeine    . Indomethacin    . Morphine and related  02/04/2013    Family History  Problem Relation Age of Onset  . Stroke    . Colon cancer    . Heart disease      aunt  . Melanoma Mother   . Cancer Mother     melenoma  . Tuberculosis Father     History   Social History  . Marital Status: Widowed    Spouse Name: N/A    Number of Children: N/A  . Years of Education: N/A   Occupational History  . Not on file.   Social History Main Topics  . Smoking status: Former Games developermoker  . Smokeless tobacco: Former NeurosurgeonUser  . Alcohol Use: No  . Drug Use: No    REVIEW OF SYSTEMS: See HPI.  Full  12 system review completed   PHYSICAL EXAM: Vital signs in last 24 hours: Filed Vitals:   04/19/14 0730  BP: 113/49  Pulse: 81  Temp:   Resp: 21   Wt Readings from Last 3 Encounters:  04/19/14 39.917 kg (88 lb)  01/09/14 39.463 kg (87 lb)  01/09/14 39.463 kg (87 lb)   General: frail, pale, looks ill.  Head:  No asymmetry.  No swellling  Eyes:  No icterus, + pallor Ears:  Unable to assess hearing, may be slightly reduced  Nose:  No congestion, no blood from nose. Mouth:  No blood in mouth Neck:  No JVD, no mass Lungs:  Clear bil.  Breathing quietly Heart: RRR.  No MRG.  Not brady or tachycardic Abdomen:  Soft, thin, NT, ND.  No mass or HSM, no bruit.   Rectal: large amount of vermiform, clotted medium to dark red blood.    Musc/Skeltl: no joint swelling. Extremities:  No pedal edema.  Feet warm   Neurologic:  Oriented to  place, self.  No tremor.  Limb  strength not tested Skin:  No rash, no telangectasia Tattoos:  none Nodes:  No cervical adenopathy.    Psych:  Cooperative, relaxed, quiet.  Speaks in full sentances.    LAB RESULTS:  Recent Labs  04/19/14 0609 04/19/14 0612  WBC  --  16.7*  HGB 12.2 10.9*  HCT 36.0 32.7*  PLT  --  307   BMET Lab Results  Component Value Date   NA 130* 04/19/2014   NA 136* 01/10/2014   NA 137 01/09/2014   K 4.2 04/19/2014   K 4.1 01/10/2014   K 3.9 01/09/2014   CL 98 04/19/2014   CL 99 01/10/2014   CL 101 01/09/2014   CO2 26 01/10/2014   CO2 27 01/09/2014   CO2 21 01/08/2014   GLUCOSE 151* 04/19/2014   GLUCOSE 122* 01/10/2014   GLUCOSE 104* 01/09/2014   BUN 26* 04/19/2014   BUN 17 01/10/2014   BUN 12 01/09/2014   CREATININE 0.80 04/19/2014   CREATININE 0.37* 01/10/2014   CREATININE 0.50 01/09/2014   CALCIUM 8.4 01/10/2014   CALCIUM 8.3* 01/09/2014   CALCIUM 8.9 01/08/2014   LFT No results found for this basename: PROT, ALBUMIN, AST, ALT, ALKPHOS, BILITOT, BILIDIR, IBILI,  in the last 72 hours PT/INR Lab Results  Component Value Date   INR 1.05 04/19/2014   INR 1.04 01/07/2014   INR 0.98 02/04/2013   Lipase     Component Value Date/Time   LIPASE 34 04/19/2014 0640     RADIOLOGY STUDIES: Dg Chest Port 1 View  04/19/2014   CLINICAL DATA:  Weakness.  Gastrointestinal bleeding.  EXAM: PORTABLE CHEST - 1 VIEW  COMPARISON:  01/07/2014  FINDINGS: Extensive artifact overlies the chest. Heart size is normal. There is calcification of the thoracic aorta. The lungs are hyperinflated in clear. There is a linear scar at the right lung base. Tiny amount of pleural fluid is not excluded. No active parenchymal pathology. No pulmonary edema. No significant bony finding.  IMPRESSION: No active disease. Pulmonary hyperinflation. Cannot rule out small amount of pleural fluid.   Electronically Signed   By: Paulina Fusi M.D.   On: 04/19/2014 07:11    ENDOSCOPIC STUDIES: 05/2007   EGD   Dr Jarold Motto For dys/odynophagia Gastritis.  No stricture but empiric esophageal dilatation performed.   12/1999   Colonoscopy For chronic recurrent diverticulitis, constipation, screening Severe pan diverticulosis throughout colon, many wide mouthed.  Long, tortuous, redundant colon:  Functional constipation.    IMPRESSION:   *  Lower GI bleed.  Most likely diverticular.  Less likely ischemic or UGI source though pt is having some lower abdominal pain and nausea, but no emesis so far. BUN is also newly, mildly elevated.   *  ABL anemia. S/p 1 unit PRBC as well as platelets.   *  Hx gastritis in 2008.  No PPI at home in pt on daily full dose ASA.    PLAN:     *  Possible  RBC scan but not currently with her hypotension. *  ? EGD to assess for UGI source.  *  Advised family to have code d/w attending MD.  They wish pt to be DNR, as has been case in past.  May be open to pressure support med.  *  Will start empiric PPI.    Jennye Moccasin  04/19/2014, 8:12 AM Pager: [phone]     [facility] Attending  I have also seen and assessed the patient and agree with the above note.  Balance of info suggests recurrent diverticular bleeding. Will reassess tomorrow - would not do an EGd at this time  Iva Boop, MD, [facility] [phone] (pager) 04/19/2014 6:04 PM

## 2014-04-19 NOTE — ED Provider Notes (Signed)
CSN: [id number]     Arrival date & time 04/19/14  0543 History   First MD Initiated Contact with Patient 04/19/14 [phone]     Chief Complaint  Patient presents with  . Melena  . Abdominal Pain     (Consider location/radiation/quality/duration/timing/severity/associated sxs/prior Treatment) The history is provided by the patient and a relative. No language interpreter was used.  Ashley Burnett is a 80 y/o F with PMHx of diverticulosis, TIA, osteoporosis, fracture, high cholesterol, depression, gastritis, dementia, arthritis, appendectomy presenting to the ED, BIBEMS, with GI bleed that started this morning at 5:00AM as per patient's granddaughter. Granddaughter, that currently lives with patient, reported that at approximately 5:00AM this morning, patient called granddaughter into the bathroom, and reported that patient had bright red blood in the toilet. Patient reported that this has never happened before and that she has been feeling week. Stated that she has been having lower abdominal pain described as a discomfort without radiation. Reported that she has been feeling nauseous. Denied emesis, fever, chest pain, shortness of breath, difficulty breathing. PCP Dr. Laury Axon   Past Medical History  Diagnosis Date  . Diverticulosis     universal,severe  . Family history of GERD   . Osteoporosis   . Transient ischemic attack   . Fracture     leg wrist  . High cholesterol   . Depression   . Gastritis   . Arthritis   . Dementia    Past Surgical History  Procedure Laterality Date  . Abdominal hysterectomy    . Laparotomy  1960    SBO  . Ovarian cyst removal    . Tonsillectomy    . Appendectomy    . Abdominal adhesion surgery  1993    SBO   . Laparotomy N/A 02/09/2013    Procedure: EXPLORATORY LAPAROTOMY;  Surgeon: Emelia Loron, MD;  Location: [facility] OR;  Service: General;  Laterality: N/A;  . Colon resection N/A 02/09/2013    Procedure: COLON RESECTION;  Surgeon: Emelia Loron, MD;   Location: [facility] OR;  Service: General;  Laterality: N/A;  . Intramedullary (im) nail intertrochanteric Left 01/08/2014    Procedure: INTRAMEDULLARY (IM) NAIL INTERTROCHANTRIC;  Surgeon: Nadara Mustard, MD;  Location: [facility] OR;  Service: Orthopedics;  Laterality: Left;   Family History  Problem Relation Age of Onset  . Stroke    . Colon cancer    . Heart disease      aunt  . Melanoma Mother   . Cancer Mother     melenoma  . Tuberculosis Father    History  Substance Use Topics  . Smoking status: Former Games developer  . Smokeless tobacco: Former Neurosurgeon  . Alcohol Use: No   OB History   Grav Para Term Preterm Abortions TAB SAB Ect Mult Living                 Review of Systems  Constitutional: Negative for fever and chills.  Respiratory: Negative for chest tightness and shortness of breath.   Cardiovascular: Negative for chest pain.  Gastrointestinal: Positive for nausea, abdominal pain, blood in stool and anal bleeding. Negative for vomiting, diarrhea and constipation.  Neurological: Positive for weakness.      Allergies  Codeine; Indomethacin; and Morphine and related  Home Medications   Prior to Admission medications   Medication Sig Start Date End Date Taking? Authorizing Provider  acetaminophen (TYLENOL) 325 MG tablet Take 2 tablets (650 mg total) by mouth every 6 (six) hours as needed. 02/17/13  Yes Lanora Manis  White, PA-C  diphenhydrAMINE (BENADRYL) 25 mg capsule Take 25 mg by mouth 2 (two) times daily as needed for allergies.   Yes Historical Provider, MD  FLUoxetine (PROZAC) 20 MG capsule Take 1 capsule (20 mg total) by mouth daily. 02/17/13  Yes Doristine MangoElizabeth White, PA-C  hydrOXYzine (VISTARIL) 100 MG capsule Take 100-200 mg by mouth at bedtime as needed for anxiety.   Yes Historical Provider, MD  Multiple Vitamin (MULTIVITAMIN WITH MINERALS) TABS tablet Take 1 tablet by mouth daily. Centrum sliver   Yes Historical Provider, MD  feeding supplement (ENSURE COMPLETE) LIQD Take 237 mLs by  mouth 2 (two) times daily between meals. 02/17/13   Elizabeth White, PA-C   BP 113/49  Pulse 81  Temp(Src) 97.6 F (36.4 C) (Oral)  Resp 21  Ht 5\' 4"  (1.626 m)  Wt 88 lb (39.917 kg)  BMI 15.10 kg/m2  SpO2 98% Physical Exam  Nursing note and vitals reviewed. Constitutional: She is oriented to person, place, and time. No distress.  Frail appearing elderly woman sitting upright in bed in a pool of blood on the sheets from rectum  HENT:  Head: Normocephalic and atraumatic.  Mouth/Throat: Oropharynx is clear and moist. No oropharyngeal exudate.  Eyes: Conjunctivae and EOM are normal. Pupils are equal, round, and reactive to light. Right eye exhibits no discharge. Left eye exhibits no discharge.  Neck: Normal range of motion. Neck supple. No tracheal deviation present.  Cardiovascular: Regular rhythm and normal heart sounds.  Exam reveals no friction rub.   No murmur heard. Mild tachycardia upon auscultation   Pulmonary/Chest: Effort normal and breath sounds normal. No respiratory distress. She has no wheezes. She has no rales.  Abdominal: Soft. Bowel sounds are normal. She exhibits no distension. There is tenderness. There is no rebound and no guarding.  Negative abdominal distension  BS normoactive in all 4 quadrants Small mass measuring approximately 2 cm localized to the LLQ, mild tenderness upon palpation  Abdomen soft upon palpation  Discomfort upon palpation to the suprapubic region  Negative peritoneal signs Negative guarding or rigidity   Genitourinary: Guaiac positive stool.  Patient active bleeding bright red blood per rectum   Musculoskeletal: Normal range of motion. She exhibits no edema and no tenderness.  Lymphadenopathy:    She has no cervical adenopathy.  Neurological: She is alert and oriented to person, place, and time. No cranial nerve deficit. She exhibits normal muscle tone. Coordination normal.  Cranial nerves III-XII grossly intact Equal grip strength   Negative facial drooping Negative slurred speech Negative aphasia Patient is able to follow commands  Skin: Skin is warm and dry. No rash noted. She is not diaphoretic. No erythema.  Psychiatric: She has a normal mood and affect. Her behavior is normal. Thought content normal.    ED Course  Procedures (including critical care time)  6:53 AM This provider spoke with Dr. Sung AmabileSimonds, Critical Care - discussed case, history, labs, and vitals in great detail. Critical Care to come and assess patient.   Results for orders placed during the hospital encounter of 04/19/14  CBC WITH DIFFERENTIAL      Result Value Ref Range   WBC 16.7 (*) 4.0 - 10.5 K/uL   RBC 3.72 (*) 3.87 - 5.11 MIL/uL   Hemoglobin 10.9 (*) 12.0 - 15.0 g/dL   HCT 11.932.7 (*) 14.736.0 - 82.946.0 %   MCV 87.9  78.0 - 100.0 fL   MCH 29.3  26.0 - 34.0 pg   MCHC 33.3  30.0 - 36.0  g/dL   RDW 16.1 (*) 09.6 - 04.5 %   Platelets 307  150 - 400 K/uL   Neutrophils Relative % 73  43 - 77 %   Neutro Abs 12.2 (*) 1.7 - 7.7 K/uL   Lymphocytes Relative 15  12 - 46 %   Lymphs Abs 2.6  0.7 - 4.0 K/uL   Monocytes Relative 10  3 - 12 %   Monocytes Absolute 1.6 (*) 0.1 - 1.0 K/uL   Eosinophils Relative 2  0 - 5 %   Eosinophils Absolute 0.3  0.0 - 0.7 K/uL   Basophils Relative 0  0 - 1 %   Basophils Absolute 0.0  0.0 - 0.1 K/uL  LIPASE, BLOOD      Result Value Ref Range   Lipase 34  11 - 59 U/L  PROTIME-INR      Result Value Ref Range   Prothrombin Time 13.7  11.6 - 15.2 seconds   INR 1.05  0.00 - 1.49  I-STAT CHEM 8, ED      Result Value Ref Range   Sodium 130 (*) 137 - 147 mEq/L   Potassium 4.2  3.7 - 5.3 mEq/L   Chloride 98  96 - 112 mEq/L   BUN 26 (*) 6 - 23 mg/dL   Creatinine, Ser 4.09  0.50 - 1.10 mg/dL   Glucose, Bld 811 (*) 70 - 99 mg/dL   Calcium, Ion 9.14  7.82 - 1.30 mmol/L   TCO2 21  0 - 100 mmol/L   Hemoglobin 12.2  12.0 - 15.0 g/dL   HCT 95.6  21.3 - 08.6 %  POC OCCULT BLOOD, ED      Result Value Ref Range   Fecal Occult  Bld POSITIVE (*) NEGATIVE  PREPARE RBC (CROSSMATCH)      Result Value Ref Range   Order Confirmation ORDER PROCESSED BY BLOOD BANK    TYPE AND SCREEN      Result Value Ref Range   ABO/RH(D) A POS     Antibody Screen NEG     Sample Expiration 04/22/2014     Unit Number [id number]     Blood Component Type RED CELLS,LR     Unit division 00     Status of Unit ISSUED     Transfusion Status OK TO TRANSFUSE     Crossmatch Result Compatible     Unit Number [id number]     Blood Component Type RED CELLS,LR     Unit division 00     Status of Unit ALLOCATED     Transfusion Status OK TO TRANSFUSE     Crossmatch Result Compatible    PREPARE PLATELET PHERESIS      Result Value Ref Range   Unit Number [id number]     Blood Component Type PLTPHER LR1     Unit division 00     Status of Unit ISSUED     Transfusion Status OK TO TRANSFUSE      Labs Review Labs Reviewed  CBC WITH DIFFERENTIAL - Abnormal; Notable for the following:    WBC 16.7 (*)    RBC 3.72 (*)    Hemoglobin 10.9 (*)    HCT 32.7 (*)    RDW 16.1 (*)    Neutro Abs 12.2 (*)    Monocytes Absolute 1.6 (*)    All other components within normal limits  I-STAT CHEM 8, ED - Abnormal; Notable for the following:    Sodium 130 (*)    BUN 26 (*)  Glucose, Bld 151 (*)    All other components within normal limits  POC OCCULT BLOOD, ED - Abnormal; Notable for the following:    Fecal Occult Bld POSITIVE (*)    All other components within normal limits  LIPASE, BLOOD  PROTIME-INR  URINALYSIS, ROUTINE W REFLEX MICROSCOPIC  TROPONIN I  PREPARE RBC (CROSSMATCH)  TYPE AND SCREEN  PREPARE PLATELET PHERESIS    Imaging Review Dg Chest Port 1 View  04/19/2014   CLINICAL DATA:  Weakness.  Gastrointestinal bleeding.  EXAM: PORTABLE CHEST - 1 VIEW  COMPARISON:  01/07/2014  FINDINGS: Extensive artifact overlies the chest. Heart size is normal. There is calcification of the thoracic aorta. The lungs are hyperinflated in clear.  There is a linear scar at the right lung base. Tiny amount of pleural fluid is not excluded. No active parenchymal pathology. No pulmonary edema. No significant bony finding.  IMPRESSION: No active disease. Pulmonary hyperinflation. Cannot rule out small amount of pleural fluid.   Electronically Signed   By: Paulina Fusi M.D.   On: 04/19/2014 07:11     EKG Interpretation   Date/Time:  Wednesday April 19 2014 05:59:22 EDT Ventricular Rate:  87 PR Interval:  144 QRS Duration: 68 QT Interval:  358 QTC Calculation: 431 R Axis:   -41 Text Interpretation:  Sinus rhythm Left axis deviation Low voltage,  precordial leads Confirmed by [facility]  MD, APRIL ([id number]) on  04/19/2014 6:04:03 AM      MDM   Final diagnoses:  Active bleeding  Lower GI bleed    Medications  pantoprazole (PROTONIX) 80 mg in sodium chloride 0.9 % 100 mL IVPB (not administered)  sodium chloride 0.9 % bolus 1,000 mL (1,000 mLs Intravenous New Bag/Given 04/19/14 0640)  sodium chloride 0.9 % bolus 1,000 mL (1,000 mLs Intravenous New Bag/Given 04/19/14 0640)   Filed Vitals:   04/19/14 0630 04/19/14 0645 04/19/14 0715 04/19/14 0730  BP: 89/64 117/56 114/52 113/49  Pulse: 89  76 81  Temp:      TempSrc:      Resp: 15 23 14 21   Height:      Weight:      SpO2: 97%  97% 98%   EKG noted normal sinus with a heart rate of 87 beats minutes. CBC noted elevated white blood cell count 16.7 with elevated neutrophils of 12.2. Hemoglobin 10.9, hematocrit 32.7. Chem 8 noted elevated BUN of 26. Glucose 151. Lipase negative elevation. INR of 1.05. Fecal occult positive. Chest xray negative active disease - pulmonary hyperinflation, cannot rule out small amount of pleural fluid.  Patient presenting to the ED with GI bleed, actively bleeding of bright red blood per rectum. While in ED setting patient became hypotensive with a blood pressure of 90s over 50s with a heart rate in the upper 100s. Patient placed on IV fluids, 2 large IV  bores placed with 2 L administered. Type and screen ordered. Transfusion of packed red blood cells and platelets ordered. Patient has history of diverticulosis - elevated WBC could be due to stress or could be diverticulitis leading to GI bleed. Patient takes ASA daily can be cause as well.  This provider spoke to Critical Care - patient to be admitted to the ICU for active GI bleed and hypotension - hemorrhagic shock. Admitted to the care of Dr. Kendrick Fries. GI to consult.   Raymon Mutton, PA-C 04/19/14 0931  Raymon Mutton, PA-C 04/19/14 [phone]

## 2014-04-20 ENCOUNTER — Encounter (HOSPITAL_COMMUNITY): Payer: Self-pay | Admitting: Physician Assistant

## 2014-04-20 DIAGNOSIS — R0989 Other specified symptoms and signs involving the circulatory and respiratory systems: Secondary | ICD-10-CM

## 2014-04-20 DIAGNOSIS — R0609 Other forms of dyspnea: Secondary | ICD-10-CM

## 2014-04-20 LAB — PREPARE PLATELET PHERESIS: Unit division: 0

## 2014-04-20 LAB — CBC
HCT: 21.5 % — ABNORMAL LOW (ref 36.0–46.0)
HCT: 26.2 % — ABNORMAL LOW (ref 36.0–46.0)
HEMATOCRIT: 22.9 % — AB (ref 36.0–46.0)
HEMOGLOBIN: 8.1 g/dL — AB (ref 12.0–15.0)
HEMOGLOBIN: 9 g/dL — AB (ref 12.0–15.0)
Hemoglobin: 7.6 g/dL — ABNORMAL LOW (ref 12.0–15.0)
MCH: 29.7 pg (ref 26.0–34.0)
MCH: 30 pg (ref 26.0–34.0)
MCH: 29.7 pg (ref 26.0–34.0)
MCHC: 35.3 g/dL (ref 30.0–36.0)
MCHC: 35.4 g/dL (ref 30.0–36.0)
MCHC: 34.4 g/dL (ref 30.0–36.0)
MCV: 84 fL (ref 78.0–100.0)
MCV: 84.8 fL (ref 78.0–100.0)
MCV: 86.5 fL (ref 78.0–100.0)
PLATELETS: 208 10*3/uL (ref 150–400)
Platelets: 216 10*3/uL (ref 150–400)
Platelets: 246 10*3/uL (ref 150–400)
RBC: 2.56 MIL/uL — AB (ref 3.87–5.11)
RBC: 2.7 MIL/uL — ABNORMAL LOW (ref 3.87–5.11)
RBC: 3.03 MIL/uL — ABNORMAL LOW (ref 3.87–5.11)
RDW: 15.8 % — ABNORMAL HIGH (ref 11.5–15.5)
RDW: 15.9 % — ABNORMAL HIGH (ref 11.5–15.5)
RDW: 15.8 % — ABNORMAL HIGH (ref 11.5–15.5)
WBC: 7.2 10*3/uL (ref 4.0–10.5)
WBC: 7.4 10*3/uL (ref 4.0–10.5)
WBC: 6.8 10*3/uL (ref 4.0–10.5)

## 2014-04-20 LAB — BASIC METABOLIC PANEL
ANION GAP: 9 (ref 5–15)
BUN: 21 mg/dL (ref 6–23)
CHLORIDE: 108 meq/L (ref 96–112)
CO2: 19 meq/L (ref 19–32)
Calcium: 7.4 mg/dL — ABNORMAL LOW (ref 8.4–10.5)
Creatinine, Ser: 0.55 mg/dL (ref 0.50–1.10)
GFR calc non Af Amer: 78 mL/min — ABNORMAL LOW (ref >90)
Glucose, Bld: 77 mg/dL (ref 70–99)
Potassium: 4 mEq/L (ref 3.7–5.3)
SODIUM: 136 meq/L — AB (ref 137–147)

## 2014-04-20 LAB — LACTIC ACID, PLASMA: Lactic Acid, Venous: 0.8 mmol/L (ref 0.5–2.2)

## 2014-04-20 MED ORDER — ENSURE COMPLETE PO LIQD: 237.0000 mL | Freq: Two times a day (BID) | ORAL | Status: DC

## 2014-04-20 MED ORDER — HYDROXYZINE HCL 50 MG PO TABS
100.0000 mg | ORAL_TABLET | Freq: Every evening | ORAL | Status: DC | PRN
  Administered 2014-04-20: 100 mg via ORAL
  Filled 2014-04-20: qty 2

## 2014-04-20 MED ORDER — BOOST / RESOURCE BREEZE PO LIQD
1.0000 | Freq: Three times a day (TID) | ORAL | Status: DC
  Administered 2014-04-20 – 2014-04-22 (×6): 1 via ORAL
  Filled 2014-04-20: qty 1

## 2014-04-20 MED ORDER — PANTOPRAZOLE SODIUM 40 MG PO TBEC
40.0000 mg | DELAYED_RELEASE_TABLET | Freq: Every day | ORAL | Status: DC
Start: 2014-04-20 — End: 2014-04-22
  Administered 2014-04-22: 40 mg via ORAL
  Filled 2014-04-20 (×2): qty 1

## 2014-04-20 MED ORDER — FLUOXETINE HCL 20 MG PO CAPS
20.0000 mg | ORAL_CAPSULE | Freq: Every day | ORAL | Status: DC
  Administered 2014-04-20 – 2014-04-22 (×3): 20 mg via ORAL
  Filled 2014-04-20 (×3): qty 1

## 2014-04-20 NOTE — Progress Notes (Signed)
INITIAL NUTRITION ASSESSMENT  DOCUMENTATION CODES Per approved criteria  -Severe malnutrition in the context of chronic illness  Pt meets criteria for severe MALNUTRITION in the context of chronic illness as evidenced by reported intake less than estimated needs for >1 month and severe fat and muscle wasting.  INTERVENTION: - Resource Breeze po TID, each supplement provides 250 kcal and 9 grams of protein. - Once diet upgraded, recommend d/c of Resource Breeze and add Ensure Complete BID. - RD will continue to monitor.  NUTRITION DIAGNOSIS: Inadequate oral intake related to diverticular disease as evidenced by reported intake less than estimated needs.   Goal: Pt to meet >/= 90% of their estimated nutrition needs   Monitor:  Weight trends, po intake, acceptance of supplements, labs  Reason for Assessment: MST  80 y.o. female  Admitting Dx: GI bleed due to NSAIDs  ASSESSMENT: 80 y/o female with know diverticular disease on full dose aspirin at home presented on 7/29 early AM to the [facility] ED with bright red blood per rectum.  - Pt reports that her weight has trended down over the past several years. She says that she has not been eating well because she has not had an appetite. Pt said that she has had Ensure Complete before and she likes it.   Nutrition Focused Physical Exam:  Subcutaneous Fat:  Orbital Region: severe wasting Upper Arm Region: severe wasting Thoracic and Lumbar Region: severe wasting  Muscle:  Temple Region: moderate wasting Clavicle Bone Region: severe wasting Clavicle and Acromion Bone Region: severe wasting Scapular Bone Region: n/a Dorsal Hand: severe wasting Patellar Region: severe wasting Anterior Thigh Region: moderate wasting Posterior Calf Region: moderate wasting  Edema: none  Height: Ht Readings from Last 1 Encounters:  04/19/14 5\' 4"  (1.626 m)    Weight: Wt Readings from Last 1 Encounters:  04/19/14 88 lb 10 oz (40.2 kg)    Ideal  Body Weight: 54.7 kg  % Ideal Body Weight: 73%  Wt Readings from Last 10 Encounters:  04/19/14 88 lb 10 oz (40.2 kg)  01/09/14 87 lb (39.463 kg)  01/09/14 87 lb (39.463 kg)  04/18/13 87 lb 6.4 oz (39.644 kg)  03/11/13 89 lb (40.37 kg)  02/28/13 86 lb 3.2 oz (39.1 kg)  02/09/13 99 lb 3.3 oz (45 kg)  02/09/13 99 lb 3.3 oz (45 kg)  09/26/11 112 lb 9.6 oz (51.075 kg)  07/21/11 105 lb 3.2 oz (47.718 kg)    Usual Body Weight: unknown  % Usual Body Weight: n/a  BMI:  Body mass index is 15.2 kg/(m^2).  Estimated Nutritional Needs: Kcal: 1200-1400 Protein: 6575 g Fluid: >1.4 L/day  Skin: Intact  Diet Order: Clear Liquid  EDUCATION NEEDS: -Education needs addressed   Intake/Output Summary (Last 24 hours) at 04/20/14 1010 Last data filed at 04/20/14 0900  Gross per 24 hour  Intake 4006.75 ml  Output    275 ml  Net 3731.75 ml    Last BM: prior to admission was having bloody stools   Labs:   Recent Labs Lab 04/19/14 0609 04/20/14 0237  NA 130* 136*  K 4.2 4.0  CL 98 108  CO2  --  19  BUN 26* 21  CREATININE 0.80 0.55  CALCIUM  --  7.4*  GLUCOSE 151* 77    CBG (last 3)   Recent Labs  04/19/14 1003  GLUCAP 194*    Scheduled Meds: . antiseptic oral rinse  7 mL Mouth Rinse BID  . chlorhexidine  15 mL Mouth  Rinse BID  . pantoprazole  40 mg Oral Q0600    Continuous Infusions: . sodium chloride 125 mL/hr at 04/19/14 1820    Past Medical History  Diagnosis Date  . Diverticulosis 2001    universal,severe with recurrent diverticulitis.   . Family history of GERD   . Osteoporosis   . Transient ischemic attack   . Fracture     leg wrist  . High cholesterol   . Depression   . Gastritis 2008    seen on EGD  . Arthritis   . Dementia   . Lower GI bleed 03/2014    likely diverticular bleed.   . Anemia due to blood loss 03/2014    Past Surgical History  Procedure Laterality Date  . Abdominal hysterectomy    . Laparotomy  1960    SBO  . Ovarian  cyst removal    . Tonsillectomy    . Appendectomy    . Abdominal adhesion surgery  1993    SBO   . Laparotomy N/A 02/09/2013    Procedure: EXPLORATORY LAPAROTOMY;  Surgeon: Emelia LoronMatthew Wakefield, MD;  [facility];  Service: General;  Laterality: N/A;  . Colon resection N/A 02/09/2013    Procedure: COLON RESECTION;  Surgeon: Emelia LoronMatthew Wakefield, MD;  [facility];  Service: General;  Laterality: N/A;  . Intramedullary (im) nail intertrochanteric Left 01/08/2014    Procedure: INTRAMEDULLARY (IM) NAIL INTERTROCHANTRIC;  Surgeon: Nadara MustardMarcus V Duda, MD;  Location: [facility] OR;  Service: Orthopedics;  Laterality: Left;    Ebbie LatusHaley Hawkins RD, LDN

## 2014-04-20 NOTE — Progress Notes (Signed)
eLink Physician-Brief Progress Note Patient Name: Ashley Burnett DOB: 11-02-1917 MRN: [id number]  Date of Service  04/20/2014   HPI/Events of Note   Pt reports feeling anxious and having trouble with sleep.  She takes vistaril at home prn for this.   eICU Interventions   Will re-order prn vistaril.   Intervention Category Major Interventions: Other:  Deion Swift 04/20/2014, 11:39 PM

## 2014-04-20 NOTE — Progress Notes (Addendum)
Report called to receiving RN, Annabelle Harmanana.  All questions answered.  Patient to be transferred to new room location, 6N room 1 from 2MW room 5.  Patient's chart and medications sent with patient.  Patient transported to new room by safety sitter, [place] and myself.  Safety measures maintained.  Patient's daughter Ander Slade(Daanya) called regarding new room location.  Keitha ButteSMITH, Krysteena Stalker A, RN

## 2014-04-20 NOTE — Progress Notes (Signed)
PULMONARY / CRITICAL CARE MEDICINE   Name: Ashley Burnett MRN: [id number] DOB: 1917/10/30    ADMISSION DATE:  04/19/2014 CONSULTATION DATE:  04/19/2014  REFERRING MD :  Nicanor Alcon  CHIEF COMPLAINT:  GI bleed  INITIAL PRESENTATION: 80 y/o female PMH chronic dementia, known diverticulosis, from SNF, on full dose aspirin at home presented on 7/29 early AM to the [facility] ED with bright red blood per rectum.  SIGNIFICANT EVENTS/STUDIES: 7/29 - Admitted for GIB, likely diverticular lower GIB with hemorrhagic shock, s/p IVF, 2u PRBC / 1u plt, Hgb 12.2 >> 8.9         - GI consulted, on Protonix gtt 7/30 - No more active bleeding, Hgb down 9.5 >> 7.6, switch to Protonix IV BID         - CXR (no acute finding, pulm hyperinflation, small pleural fluid)  SUBJECTIVE:  Per RN, reports doing well overnight without acute events, no active bleeding, no bowel movements. Patient's history limited by patient's chronic dementia. She stated not having any memory of inciting event, but currently reports resolved abdominal pain. Denies CP, SOB, HA, n/v or other complaints.  VITAL SIGNS: Temp:  [97.3 F (36.3 C)-98.7 F (37.1 C)] 98 F (36.7 C) (07/30 0426) Pulse Rate:  [65-91] 72 (07/30 0600) Resp:  [14-24] 15 (07/30 0600) BP: (80-148)/(42-88) 102/50 mmHg (07/30 0600) SpO2:  [92 %-100 %] 92 % (07/30 0600) Weight:  [88 lb 10 oz (40.2 kg)] 88 lb 10 oz (40.2 kg) (07/29 1030) HEMODYNAMICS:   VENTILATOR SETTINGS:   INTAKE / OUTPUT:  Intake/Output Summary (Last 24 hours) at 04/20/14 0720 Last data filed at 04/20/14 0600  Gross per 24 hour  Intake 3644.25 ml  Output    275 ml  Net 3369.25 ml    PHYSICAL EXAMINATION: Gen: frail elderly female, cooperative and NAD HEENT: NCAT, PERRL, EOMI, dry MM PULM: CTAB, nml resp effort CV: RRR, systolic murmur noted LUSB, no JVD AB: soft, non-tender, non-distended, no guarding or rebound, no masses, +active BS Ext: warm, no edema, no clubbing, no cyanosis Neuro: Awake  and alert, +/- confusion with intermittent oriented (to name only, not place or year), non-focal, follows commands  LABS:  CBC  Recent Labs Lab 04/19/14 1324 04/19/14 1855 04/20/14 0237  WBC 9.5 9.5 7.2  HGB 8.9* 9.5* 7.6*  HCT 25.6* 27.1* 21.5*  PLT 283 251 208   Coag's  Recent Labs Lab 04/19/14 0640 04/19/14 1324  INR 1.05 1.22   BMET  Recent Labs Lab 04/19/14 0609 04/20/14 0237  NA 130* 136*  K 4.2 4.0  CL 98 108  CO2  --  19  BUN 26* 21  CREATININE 0.80 0.55  GLUCOSE 151* 77   Electrolytes  Recent Labs Lab 04/20/14 0237  CALCIUM 7.4*   Sepsis Markers No results found for this basename: LATICACIDVEN, PROCALCITON, O2SATVEN,  in the last 168 hours ABG No results found for this basename: PHART, PCO2ART, PO2ART,  in the last 168 hours Liver Enzymes No results found for this basename: AST, ALT, ALKPHOS, BILITOT, ALBUMIN,  in the last 168 hours Cardiac Enzymes  Recent Labs Lab 04/19/14 0645  TROPONINI <0.30   Glucose  Recent Labs Lab 04/19/14 1003  GLUCAP 194*   Imaging Dg Chest Port 1 View  04/19/2014   CLINICAL DATA:  Weakness.  Gastrointestinal bleeding.  EXAM: PORTABLE CHEST - 1 VIEW  COMPARISON:  01/07/2014  FINDINGS: Extensive artifact overlies the chest. Heart size is normal. There is calcification of the thoracic aorta. The lungs  are hyperinflated in clear. There is a linear scar at the right lung base. Tiny amount of pleural fluid is not excluded. No active parenchymal pathology. No pulmonary edema. No significant bony finding.  IMPRESSION: No active disease. Pulmonary hyperinflation. Cannot rule out small amount of pleural fluid.   Electronically Signed   By: Paulina FusiMark  Shogry M.D.   On: 04/19/2014 07:11   ASSESSMENT / PLAN:  GASTROINTESTINAL A: Acute lower GIB, presumed diverticular bleed, on ASA but less likely PUD - Resolved bleeding. Hgb dec  P: Monitor for active bleeding, currently stable Follow CBC q 12 hr F/u lactic acid today  AM GI ([facility]) following - likely diverticular bleed, no plans for EGD, consider RBC scan, cont empiric PPI NPO Protonix IV BID, no longer on gtt  HEMATOLOGIC A: Acute Blood Loss Anemia, secondary to lower GIB above, s/p 2u PRBC / 1u plt - Hgb 9.5 >> 7.6 Previously on full dose ASA, prior to adm P:  Anticipate transfer out of ICU to SDU today for continued monitoring Discontinue ASA Re-check CBC now and q 12 hr Suspect some level of hemo-dilutional component with initial fluid resuscitation Transfusion threshold Hgb < 8 gm/dL after initial transfusion VTE prophylaxis with SCDs  PULMONARY A:  No acute issues P:   Monitor O2 sat, supplemental O2 PRN  CARDIOVASCULAR A: Hemorrhagic shock, secondary to GIB - Resolved s/p 2u PRBC and IVF P:  No pressor requirement Monitor BP / MAP, telemetry Continue IVF 125 cc/hr, remains NPO Transfuse PRN, see Heme  RENAL A: No acute issues P:   Monitor UOP/BMET  INFECTIOUS A: No acute issues P:   Monitor for fever  ENDOCRINE A: No acute issues P:   Monitor glucose  NEUROLOGIC A: Dementia, chronic Previous hx intermittent delirium / sundowning episodes - question home anti-histamines, polypharmacy P:   Hold home anti-histamines Frequent orientation Allow caregiver in room as much as possible Minimize sedating meds Considering changing home sleep aid to trazodone  Saralyn PilarAlexander Karamalegos, DO [facility], PGY-2  TODAY'S SUMMARY: Transfer out of ICU to SDU today with resolved likely lower diverticular GIB, GI following and may pursue further testing, no current plans for EGD. Cont closely monitor Hgb and cont transfuse Hgb < 8.0, BP improved, cont monitoring.   CODE: DNR  Family: updated granddaughter at bedside, daughter, Avon GullyHCPOA (also named Bing ReeJoy Coker) is on way into hospital  Patient seen and examined, agree with above note.  I dictated the care and orders written for this patient under my  direction.  Alyson ReedyWesam G Yacoub, MD [phone]  04/20/2014, 7:20 AM

## 2014-04-20 NOTE — Progress Notes (Signed)
          Daily Rounding Note  04/20/2014, 8:20 AM  LOS: 1 day   SUBJECTIVE:       No hematochezia or stools of any sort.  No abd pain.  No nausea. Feels cold otherwise no complaints  OBJECTIVE:         Vital signs in last 24 hours:    Temp:  [97.3 F (36.3 C)-98.9 F (37.2 C)] 98.9 F (37.2 C) (07/30 0800) Pulse Rate:  [65-91] 66 (07/30 0800) Resp:  [14-24] 19 (07/30 0800) BP: (85-148)/(46-88) 139/55 mmHg (07/30 0800) SpO2:  [92 %-100 %] 96 % (07/30 0800) Weight:  [40.2 kg (88 lb 10 oz)] 40.2 kg (88 lb 10 oz) (07/29 1030) Last BM Date: 04/19/14 General: frail, pale but comfortable   Heart: RRR Chest: clear.  No labored breathing.  Abdomen: soft, NT. ND.  Active BS  Extremities: no CCE.  Feet warm.  Neuro/Psych:  Oriented x 3.  Appropriate. Relaxed.    Lab Results:  Recent Labs  04/19/14 1324 04/19/14 1855 04/20/14 0237  WBC 9.5 9.5 7.2  HGB 8.9* 9.5* 7.6*  HCT 25.6* 27.1* 21.5*  PLT 283 251 208    Scheduled Meds: . antiseptic oral rinse  7 mL Mouth Rinse BID  . chlorhexidine  15 mL Mouth Rinse BID  . pantoprazole (PROTONIX) IV  40 mg Intravenous Q12H   Continuous Infusions: . sodium chloride 125 mL/hr at 04/19/14 1820     ASSESMENT:   * Lower GI bleed. Most likely diverticular. Less likely ischemic or UGI source though pt is having some lower abdominal pain and nausea, but no emesis so far. Mildly elevated BUN is now normal. On BID IV PPI.  * ABL anemia. S/p 2 PRBC 1 of platelets.  Hgb down 2 grams from yesterday's post transfusion high, down 3 grams overall.  * Hx gastritis in 2008. No PPI at home in pt on daily full dose ASA.      PLAN   *  BID CBC.  *  Clears *  Switch to po Protonix. Will not require this for long term use.      Jennye MoccasinSarah Gribbin  04/20/2014, 8:20 AM Pager: [phone]  Agree w/ Ms. Heron NayGribbin's note - bleeding stopping No polans for endoscopic evaluation presently Continue  supportive care and would dc after 48 hrs-72 hrs no bleeding.  Call if ?  Iva Booparl E. Gessner, MD, Antionette FairyFACG [facility] [phone] (pager) 04/20/2014 5:27 PM

## 2014-04-21 DIAGNOSIS — F3289 Other specified depressive episodes: Secondary | ICD-10-CM

## 2014-04-21 DIAGNOSIS — F329 Major depressive disorder, single episode, unspecified: Secondary | ICD-10-CM

## 2014-04-21 DIAGNOSIS — F03918 Unspecified dementia, unspecified severity, with other behavioral disturbance: Secondary | ICD-10-CM

## 2014-04-21 DIAGNOSIS — F0391 Unspecified dementia with behavioral disturbance: Secondary | ICD-10-CM

## 2014-04-21 LAB — CBC
HEMATOCRIT: 22.4 % — AB (ref 36.0–46.0)
HEMATOCRIT: 25.1 % — AB (ref 36.0–46.0)
HEMOGLOBIN: 7.9 g/dL — AB (ref 12.0–15.0)
HEMOGLOBIN: 8.7 g/dL — AB (ref 12.0–15.0)
MCH: 29.8 pg (ref 26.0–34.0)
MCH: 30.3 pg (ref 26.0–34.0)
MCHC: 35.3 g/dL (ref 30.0–36.0)
MCHC: 34.7 g/dL (ref 30.0–36.0)
MCV: 84.5 fL (ref 78.0–100.0)
MCV: 87.5 fL (ref 78.0–100.0)
Platelets: 244 10*3/uL (ref 150–400)
Platelets: 249 10*3/uL (ref 150–400)
RBC: 2.65 MIL/uL — AB (ref 3.87–5.11)
RBC: 2.87 MIL/uL — AB (ref 3.87–5.11)
RDW: 15.6 % — ABNORMAL HIGH (ref 11.5–15.5)
RDW: 15.7 % — ABNORMAL HIGH (ref 11.5–15.5)
WBC: 7.2 10*3/uL (ref 4.0–10.5)
WBC: 6.3 10*3/uL (ref 4.0–10.5)

## 2014-04-21 LAB — BASIC METABOLIC PANEL
Anion gap: 7 (ref 5–15)
BUN: 8 mg/dL (ref 6–23)
CO2: 24 meq/L (ref 19–32)
CREATININE: 0.59 mg/dL (ref 0.50–1.10)
Calcium: 8 mg/dL — ABNORMAL LOW (ref 8.4–10.5)
Chloride: 108 mEq/L (ref 96–112)
GFR calc Af Amer: 89 mL/min — ABNORMAL LOW (ref >90)
GFR calc non Af Amer: 77 mL/min — ABNORMAL LOW (ref >90)
Glucose, Bld: 98 mg/dL (ref 70–99)
POTASSIUM: 3.8 meq/L (ref 3.7–5.3)
Sodium: 139 mEq/L (ref 137–147)

## 2014-04-21 MED ORDER — QUETIAPINE FUMARATE 25 MG PO TABS
25.0000 mg | ORAL_TABLET | Freq: Every day | ORAL | Status: DC
  Administered 2014-04-21: 25 mg via ORAL
  Filled 2014-04-21 (×2): qty 1

## 2014-04-21 NOTE — Progress Notes (Addendum)
[facility] Progress Note   Ashley Burnett ZOX:[id number] DOB: May 16, 1918 DOA: 04/19/2014 PCP: Loreen Freud, DO  Brief narrative: Ashley Burnett is a 80 y.o. female  presents with bright red blood per rectum. She was found in a pool of blood in the bathroom at home. She had 5 bloody stools by the time she was admitted to the hospital. BP was in systolic of 80-90s, admitted to the ICU for hemorrhagic shock.  PMH: diverticulosis, TIA, depression, Dementia, colon resection in 5/14 for SBO (multiple abd surgeries in the past)  Subjective:  Asking if she can see a psychiatrist because she does not know who she is or where she is. Confused to place and time but not person. Asking for sold food. No other complaints.   Assessment/Plan: Principal Problem:   GI bleed (due to ASA?) --  Hemorrhagic shock - received 2 U PRBC and 1 U pheresed platelets - Hgb down to 7.9- following Q12 hrs - GI following- suspecting diverticular bleed but pt on PPI for possible UGI bleed in setting of ASA use and GERD- recommended long term PPI use - started on clears - no plans for endo eval - per GI d/c home in another 48-72hrs  Active Problems:    Dementia - home antihistamines stopped by ICU as they can result in exacerbation of delirium but hydroxyzine resumed again last night due to restlessness - per granddaughter,Lina,  these were not helping her to sleep at home - pt is often very confused at night  - Will switch antihistamines to Seroquel tonight- granddaughter aggrees - sitter at bedside  Depression -cont prozac   Code Status: DNR Family Communication: Lynn Ito- granddaughter Disposition Plan: home- lives with a 24 hr caretaker at home  Consultants: GI  Procedures: none  Antibiotics: Anti-infectives   None       DVT prophylaxis: SCDs  Objective: Filed Weights   04/19/14 0557 04/19/14 1030 04/21/14 0536  Weight: 39.917 kg (88 lb) 40.2 kg (88 lb 10 oz) 41.958 kg (92 lb 8 oz)     Intake/Output Summary (Last 24 hours) at 04/21/14 0816 Last data filed at 04/21/14 0350  Gross per 24 hour  Intake   1780 ml  Output    850 ml  Net    930 ml     Vitals Filed Vitals:   04/20/14 1732 04/20/14 2121 04/21/14 0536 04/21/14 0546  BP: 172/69 139/59  131/57  Pulse: 85 73  80  Temp: 98.4 F (36.9 C) 98.4 F (36.9 C)  98.2 F (36.8 C)  TempSrc: Oral Oral  Oral  Resp: 18 18  18   Height:      Weight:   41.958 kg (92 lb 8 oz)   SpO2: 98% 97%  95%    Exam: General: Confused but alert and calm. Very thin.  Lungs: Clear to auscultation bilaterally without wheezes or crackles Cardiovascular: Regular rate and rhythm without murmur gallop or rub normal S1 and S2 Abdomen: Nontender, nondistended, soft, bowel sounds positive, no rebound, no ascites, no appreciable mass Extremities: No significant cyanosis, clubbing, or edema bilateral lower extremities  Data Reviewed: Basic Metabolic Panel:  Recent Labs Lab 04/19/14 0609 04/20/14 0237 04/21/14 0511  NA 130* 136* 139  K 4.2 4.0 3.8  CL 98 108 108  CO2  --  19 24  GLUCOSE 151* 77 98  BUN 26* 21 8  CREATININE 0.80 0.55 0.59  CALCIUM  --  7.4* 8.0*   Liver Function Tests: No results found for  this basename: AST, ALT, ALKPHOS, BILITOT, PROT, ALBUMIN,  in the last 168 hours  Recent Labs Lab 04/19/14 0640  LIPASE 34   No results found for this basename: AMMONIA,  in the last 168 hours CBC:  Recent Labs Lab 04/19/14 0612  04/19/14 1855 04/20/14 0237 04/20/14 0913 04/20/14 1820 04/21/14 0511  WBC 16.7*  < > 9.5 7.2 7.4 6.8 7.2  NEUTROABS 12.2*  --   --   --   --   --   --   HGB 10.9*  < > 9.5* 7.6* 8.1* 9.0* 7.9*  HCT 32.7*  < > 27.1* 21.5* 22.9* 26.2* 22.4*  MCV 87.9  < > 86.3 84.0 84.8 86.5 84.5  PLT 307  < > 251 208 216 246 244  < > = values in this interval not displayed. Cardiac Enzymes:  Recent Labs Lab 04/19/14 0645  TROPONINI <0.30   BNP (last 3 results) No results found for this  basename: PROBNP,  in the last 8760 hours CBG:  Recent Labs Lab 04/19/14 1003  GLUCAP 194*    Recent Results (from the past 240 hour(s))  MRSA PCR SCREENING     Status: None   Collection Time    04/19/14  9:55 AM      Result Value Ref Range Status   MRSA by PCR NEGATIVE  NEGATIVE Final   Comment:            The GeneXpert MRSA Assay (FDA     approved for NASAL specimens     only), is one component of a     comprehensive MRSA colonization     surveillance program. It is not     intended to diagnose MRSA     infection nor to guide or     monitor treatment for     MRSA infections.     Studies:  Recent x-ray studies have been reviewed in detail by the Attending Physician  Scheduled Meds:  Scheduled Meds: . feeding supplement (ENSURE COMPLETE)  237 mL Oral BID BM  . feeding supplement (RESOURCE BREEZE)  1 Container Oral TID BM  . FLUoxetine  20 mg Oral Daily  . pantoprazole  40 mg Oral Q0600   Continuous Infusions: . sodium chloride 125 mL/hr at 04/20/14 1130    Time spent on care of this patient: 35 min   Burnett Lieber, MD 04/21/2014, 8:16 AM  LOS: 2 days   [facility] Office  [phone] Pager - Text Page per [email or web address]  If 7PM-7AM, please contact night-coverage [email or web address]

## 2014-04-21 NOTE — Clinical Documentation Improvement (Signed)
Pt with  criteria for severe MALNUTRITION in the context per RD note 04/20/14  Please clarify if you agree pt with severe MALNUTRITION and document in pn or d/c summary   Possible Clinical Conditions?  Severe Malnutrition   Protein Calorie Malnutrition Severe Protein Calorie Malnutrition Emaciation  Cachexia    Other Condition Cannot clinically determine  Supporting Information: Risk Factors: GIB Hemorrhagic Shock Signs & Symptoms: Diagnostics:  BMI: Body mass index is 15.2 kg/(m^2).   HT=5'4" WT=88lb 10 oz (40.2 kg)   Treatment:  Thank You, Enis SlipperLolita J Zyire Eidson ,RN Clinical Documentation Specialist:  [phone]  [facility]- Health Information Management

## 2014-04-22 DIAGNOSIS — E43 Unspecified severe protein-calorie malnutrition: Secondary | ICD-10-CM

## 2014-04-22 LAB — CBC
HCT: 23.3 % — ABNORMAL LOW (ref 36.0–46.0)
HEMOGLOBIN: 8.1 g/dL — AB (ref 12.0–15.0)
MCH: 30.1 pg (ref 26.0–34.0)
MCHC: 34.8 g/dL (ref 30.0–36.0)
MCV: 86.6 fL (ref 78.0–100.0)
Platelets: 246 10*3/uL (ref 150–400)
RBC: 2.69 MIL/uL — ABNORMAL LOW (ref 3.87–5.11)
RDW: 15.4 % (ref 11.5–15.5)
WBC: 6 10*3/uL (ref 4.0–10.5)

## 2014-04-22 MED ORDER — QUETIAPINE FUMARATE 25 MG PO TABS: 25.0000 mg | ORAL_TABLET | Freq: Every day | ORAL | Status: DC

## 2014-04-22 MED ORDER — ENSURE COMPLETE PO LIQD
237.0000 mL | Freq: Two times a day (BID) | ORAL | Status: DC
Start: 2014-04-22 — End: 2014-04-22
  Administered 2014-04-22: 237 mL via ORAL

## 2014-04-22 NOTE — Discharge Summary (Addendum)
Physician Discharge Summary  Ashley Burnett JXB:[id number] DOB: 1918/01/05 DOA: 04/19/2014  PCP: Loreen Freud, DO  Admit date: 04/19/2014 Discharge date: 04/22/2014  Time spent: >45  minutes  Recommendations for Outpatient Follow-up:  1. F/u Hgb in 1-2 wks 2. Re-address need for ASA- will hold for now  Discharge Diagnoses:  Principal Problem:   GI bleed due to NSAIDs Active Problems:   Hemorrhagic shock   Protein-calorie malnutrition, severe   Dementia   Discharge Condition: stable  Diet recommendation: Regular diet as tolerated with supplements as ordered  Filed Weights   04/19/14 1030 04/21/14 0536 04/22/14 0608  Weight: 40.2 kg (88 lb 10 oz) 41.958 kg (92 lb 8 oz) 42.8 kg (94 lb 5.7 oz)    History of present illness:  Ashley Burnett is a 80 y.o. female presents with bright red blood per rectum. She was found in a pool of blood in the bathroom at home. She had 5 bloody stools by the time she was admitted to the hospital. BP was in systolic of 80-90s, admitted to the ICU for hemorrhagic shock.  PMH: diverticulosis, TIA, depression, Dementia, colon resection in 5/14 for SBO (multiple abd surgeries in the past)   Hospital Course:  GI bleed (due to ASA?) -- Hemorrhagic shock  - received 2 U PRBC and 1 U pheresed platelets  - Hgb now > 8 - GI following- suspecting diverticular bleed but pt on PPI for possible UGI bleed in setting of daily ASA use  - no plans for endo eval  - tolerting diet, no further bleeding - ambulating in room without difficulty   GERD - cont daily PPI  Active Problems:  Dementia  - home antihistamines stopped by ICU as they can result in exacerbation of delirium but hydroxyzine resumed again last night due to restlessness  - per granddaughter,Ashley Burnett, these were not helping her to sleep at home - pt is often very confused at night  -after discussion with granddaughter, I switched antihistamines to Seroquel last night which helped her sleep through the night-  will continue Seroquel upon d/c - sitter at bedside during hospital stay  Depression  -cont prozac  Protein Calorie malnutrition - cont Ensure BID  Positive UA - mild leukocytes and bacteria on UA (see below) but she is afebrile without leukocytosis and without symptoms- have not treated with antibiotics  Procedures:  none  Consultations:  GI  Discharge Exam: Filed Vitals:   04/22/14 0608  BP: 133/83  Pulse: 97  Temp: 97.8 F (36.6 C)  Resp: 18    General: AA- oriented to person, no distress Cardiovascular: RRR, no murmurs Respiratory: CTA b/l  Abdomen: soft, NT, ND, BS+  Discharge Instructions You were cared for by a hospitalist during your hospital stay. If you have any questions about your discharge medications or the care you received while you were in the hospital after you are discharged, you can call the unit and asked to speak with the hospitalist on call if the hospitalist that took care of you is not available. Once you are discharged, your primary care physician will handle any further medical issues. Please note that NO REFILLS for any discharge medications will be authorized once you are discharged, as it is imperative that you return to your primary care physician (or establish a relationship with a primary care physician if you do not have one) for your aftercare needs so that they can reassess your need for medications and monitor your lab values.     Medication  List    STOP taking these medications       diphenhydrAMINE 25 mg capsule  Commonly known as:  BENADRYL     hydrOXYzine 100 MG capsule  Commonly known as:  VISTARIL      TAKE these medications       acetaminophen 325 MG tablet  Commonly known as:  TYLENOL  Take 2 tablets (650 mg total) by mouth every 6 (six) hours as needed.     feeding supplement (ENSURE COMPLETE) Liqd  Take 237 mLs by mouth 2 (two) times daily between meals.     FLUoxetine 20 MG capsule  Commonly known as:  PROZAC   Take 1 capsule (20 mg total) by mouth daily.     multivitamin with minerals Tabs tablet  Take 1 tablet by mouth daily. Centrum sliver     QUEtiapine 25 MG tablet  Commonly known as:  SEROQUEL  Take 1 tablet (25 mg total) by mouth at bedtime.       Allergies  Allergen Reactions  . Codeine     unknown  . Indomethacin     Unknown. Spoke to granddaughter on 02/05/13 who stated she was not aware of this allergy and that her grandmother (Ashley Burnett) does not remember this allergy either.   . Morphine And Related     unknown      The results of significant diagnostics from this hospitalization (including imaging, microbiology, ancillary and laboratory) are listed below for reference.    Significant Diagnostic Studies: Dg Chest Port 1 View  04/19/2014   CLINICAL DATA:  Weakness.  Gastrointestinal bleeding.  EXAM: PORTABLE CHEST - 1 VIEW  COMPARISON:  01/07/2014  FINDINGS: Extensive artifact overlies the chest. Heart size is normal. There is calcification of the thoracic aorta. The lungs are hyperinflated in clear. There is a linear scar at the right lung base. Tiny amount of pleural fluid is not excluded. No active parenchymal pathology. No pulmonary edema. No significant bony finding.  IMPRESSION: No active disease. Pulmonary hyperinflation. Cannot rule out small amount of pleural fluid.   Electronically Signed   By: Paulina FusiMark  Shogry M.D.   On: 04/19/2014 07:11    Microbiology: Recent Results (from the past 240 hour(s))  MRSA PCR SCREENING     Status: None   Collection Time    04/19/14  9:55 AM      Result Value Ref Range Status   MRSA by PCR NEGATIVE  NEGATIVE Final   Comment:            The GeneXpert MRSA Assay (FDA     approved for NASAL specimens     only), is one component of a     comprehensive MRSA colonization     surveillance program. It is not     intended to diagnose MRSA     infection nor to guide or     monitor treatment for     MRSA infections.     Labs: Basic  Metabolic Panel:  Recent Labs Lab 04/19/14 0609 04/20/14 0237 04/21/14 0511  NA 130* 136* 139  K 4.2 4.0 3.8  CL 98 108 108  CO2  --  19 24  GLUCOSE 151* 77 98  BUN 26* 21 8  CREATININE 0.80 0.55 0.59  CALCIUM  --  7.4* 8.0*   Liver Function Tests: No results found for this basename: AST, ALT, ALKPHOS, BILITOT, PROT, ALBUMIN,  in the last 168 hours  Recent Labs Lab 04/19/14 0640  LIPASE 34  No results found for this basename: AMMONIA,  in the last 168 hours CBC:  Recent Labs Lab 04/19/14 0612  04/20/14 0913 04/20/14 1820 04/21/14 0511 04/21/14 1817 04/22/14 0745  WBC 16.7*  < > 7.4 6.8 7.2 6.3 6.0  NEUTROABS 12.2*  --   --   --   --   --   --   HGB 10.9*  < > 8.1* 9.0* 7.9* 8.7* 8.1*  HCT 32.7*  < > 22.9* 26.2* 22.4* 25.1* 23.3*  MCV 87.9  < > 84.8 86.5 84.5 87.5 86.6  PLT 307  < > 216 246 244 249 246  < > = values in this interval not displayed. Cardiac Enzymes:  Recent Labs Lab 04/19/14 0645  TROPONINI <0.30   BNP: BNP (last 3 results) No results found for this basename: PROBNP,  in the last 8760 hours CBG:  Recent Labs Lab 04/19/14 1003  GLUCAP 194*   Urinalysis Results for JEMIMAH, CRESSY (MRN [id number]) as of 04/22/2014 11:38  Ref. Range 04/19/2014 14:00  Color, Urine Latest Range: YELLOW  AMBER (A)  APPearance Latest Range: CLEAR  CLOUDY (A)  Specific Gravity, Urine Latest Range: 1.005-1.030  1.021  pH Latest Range: 5.0-8.0  5.0  Glucose Latest Range: NEGATIVE mg/dL 409 (A)  Bilirubin Urine Latest Range: NEGATIVE  SMALL (A)  Ketones, ur Latest Range: NEGATIVE mg/dL 15 (A)  Protein Latest Range: NEGATIVE mg/dL 30 (A)  Urobilinogen, UA Latest Range: 0.0-1.0 mg/dL 0.2  Nitrite Latest Range: NEGATIVE  NEGATIVE  Leukocytes, UA Latest Range: NEGATIVE  SMALL (A)  Hgb urine dipstick Latest Range: NEGATIVE  LARGE (A)  Urine-Other No range found AMORPHOUS URATES/PHOSPHATES  WBC, UA Latest Range: <3 WBC/hpf 3-6  RBC / HPF Latest Range: <3 RBC/hpf 3-6   Squamous Epithelial / LPF Latest Range: RARE  FEW (A)  Bacteria, UA Latest Range: RARE  MANY (A)     SignedCalvert Cantor, MD [facility] 04/22/2014, 11:31 AM

## 2014-04-22 NOTE — Discharge Planning (Signed)
Copy of AVS to pt's daughter who verbalizes understanding.  Dc'd to private car home with all personal belongings at 1730.

## 2014-04-23 LAB — TYPE AND SCREEN
ABO/RH(D): A POS
Antibody Screen: NEGATIVE
UNIT DIVISION: 0
Unit division: 0
Unit division: 0
Unit division: 0

## 2014-05-05 ENCOUNTER — Inpatient Hospital Stay (HOSPITAL_COMMUNITY)
Admission: EM | Admit: 2014-05-05 | Discharge: 2014-05-07 | DRG: 378 | Disposition: A | Discharge status: Home / Self Care | Payer: Medicare Other | Attending: Internal Medicine | Admitting: Internal Medicine

## 2014-05-05 ENCOUNTER — Encounter (HOSPITAL_COMMUNITY): Payer: Self-pay | Admitting: Emergency Medicine

## 2014-05-05 DIAGNOSIS — S8010XA Contusion of unspecified lower leg, initial encounter: Secondary | ICD-10-CM

## 2014-05-05 DIAGNOSIS — Z800 Family history of malignant neoplasm of digestive organs: Secondary | ICD-10-CM | POA: Diagnosis not present

## 2014-05-05 DIAGNOSIS — R1013 Epigastric pain: Secondary | ICD-10-CM

## 2014-05-05 DIAGNOSIS — Z66 Do not resuscitate: Secondary | ICD-10-CM | POA: Diagnosis present

## 2014-05-05 DIAGNOSIS — I059 Rheumatic mitral valve disease, unspecified: Secondary | ICD-10-CM

## 2014-05-05 DIAGNOSIS — Z885 Allergy status to narcotic agent status: Secondary | ICD-10-CM | POA: Diagnosis not present

## 2014-05-05 DIAGNOSIS — K921 Melena: Secondary | ICD-10-CM | POA: Diagnosis present

## 2014-05-05 DIAGNOSIS — S32009A Unspecified fracture of unspecified lumbar vertebra, initial encounter for closed fracture: Secondary | ICD-10-CM

## 2014-05-05 DIAGNOSIS — Z8679 Personal history of other diseases of the circulatory system: Secondary | ICD-10-CM

## 2014-05-05 DIAGNOSIS — M810 Age-related osteoporosis without current pathological fracture: Secondary | ICD-10-CM

## 2014-05-05 DIAGNOSIS — K5900 Constipation, unspecified: Secondary | ICD-10-CM

## 2014-05-05 DIAGNOSIS — R5383 Other fatigue: Secondary | ICD-10-CM

## 2014-05-05 DIAGNOSIS — Z87891 Personal history of nicotine dependence: Secondary | ICD-10-CM

## 2014-05-05 DIAGNOSIS — S0003XA Contusion of scalp, initial encounter: Secondary | ICD-10-CM

## 2014-05-05 DIAGNOSIS — IMO0002 Reserved for concepts with insufficient information to code with codable children: Secondary | ICD-10-CM

## 2014-05-05 DIAGNOSIS — F0390 Unspecified dementia without behavioral disturbance: Secondary | ICD-10-CM

## 2014-05-05 DIAGNOSIS — K219 Gastro-esophageal reflux disease without esophagitis: Secondary | ICD-10-CM | POA: Diagnosis present

## 2014-05-05 DIAGNOSIS — F329 Major depressive disorder, single episode, unspecified: Secondary | ICD-10-CM | POA: Diagnosis present

## 2014-05-05 DIAGNOSIS — K2990 Gastroduodenitis, unspecified, without bleeding: Secondary | ICD-10-CM

## 2014-05-05 DIAGNOSIS — Z888 Allergy status to other drugs, medicaments and biological substances status: Secondary | ICD-10-CM

## 2014-05-05 DIAGNOSIS — F411 Generalized anxiety disorder: Secondary | ICD-10-CM | POA: Diagnosis present

## 2014-05-05 DIAGNOSIS — S0990XA Unspecified injury of head, initial encounter: Secondary | ICD-10-CM

## 2014-05-05 DIAGNOSIS — S0083XA Contusion of other part of head, initial encounter: Secondary | ICD-10-CM

## 2014-05-05 DIAGNOSIS — S1093XA Contusion of unspecified part of neck, initial encounter: Secondary | ICD-10-CM

## 2014-05-05 DIAGNOSIS — E8941 Symptomatic postprocedural ovarian failure: Secondary | ICD-10-CM

## 2014-05-05 DIAGNOSIS — Z8659 Personal history of other mental and behavioral disorders: Secondary | ICD-10-CM

## 2014-05-05 DIAGNOSIS — Z780 Asymptomatic menopausal state: Secondary | ICD-10-CM

## 2014-05-05 DIAGNOSIS — Z8673 Personal history of transient ischemic attack (TIA), and cerebral infarction without residual deficits: Secondary | ICD-10-CM | POA: Diagnosis not present

## 2014-05-05 DIAGNOSIS — D62 Acute posthemorrhagic anemia: Secondary | ICD-10-CM

## 2014-05-05 DIAGNOSIS — R0989 Other specified symptoms and signs involving the circulatory and respiratory systems: Secondary | ICD-10-CM

## 2014-05-05 DIAGNOSIS — I10 Essential (primary) hypertension: Secondary | ICD-10-CM

## 2014-05-05 DIAGNOSIS — R5381 Other malaise: Secondary | ICD-10-CM

## 2014-05-05 DIAGNOSIS — R578 Other shock: Secondary | ICD-10-CM

## 2014-05-05 DIAGNOSIS — R0609 Other forms of dyspnea: Secondary | ICD-10-CM

## 2014-05-05 DIAGNOSIS — R413 Other amnesia: Secondary | ICD-10-CM

## 2014-05-05 DIAGNOSIS — K5730 Diverticulosis of large intestine without perforation or abscess without bleeding: Secondary | ICD-10-CM

## 2014-05-05 DIAGNOSIS — R002 Palpitations: Secondary | ICD-10-CM

## 2014-05-05 DIAGNOSIS — K149 Disease of tongue, unspecified: Secondary | ICD-10-CM

## 2014-05-05 DIAGNOSIS — F3289 Other specified depressive episodes: Secondary | ICD-10-CM | POA: Diagnosis present

## 2014-05-05 DIAGNOSIS — K922 Gastrointestinal hemorrhage, unspecified: Secondary | ICD-10-CM

## 2014-05-05 DIAGNOSIS — K2970 Gastritis, unspecified, without bleeding: Secondary | ICD-10-CM

## 2014-05-05 DIAGNOSIS — E43 Unspecified severe protein-calorie malnutrition: Secondary | ICD-10-CM

## 2014-05-05 DIAGNOSIS — S62109A Fracture of unspecified carpal bone, unspecified wrist, initial encounter for closed fracture: Secondary | ICD-10-CM

## 2014-05-05 DIAGNOSIS — T39395A Adverse effect of other nonsteroidal anti-inflammatory drugs [NSAID], initial encounter: Secondary | ICD-10-CM

## 2014-05-05 LAB — SAMPLE TO BLOOD BANK

## 2014-05-05 LAB — COMPREHENSIVE METABOLIC PANEL
ALBUMIN: 2.2 g/dL — AB (ref 3.5–5.2)
ALT: 17 U/L (ref 0–35)
ANION GAP: 12 (ref 5–15)
AST: 18 U/L (ref 0–37)
Alkaline Phosphatase: 102 U/L (ref 39–117)
BILIRUBIN TOTAL: 0.3 mg/dL (ref 0.3–1.2)
BUN: 21 mg/dL (ref 6–23)
CHLORIDE: 101 meq/L (ref 96–112)
CO2: 24 mEq/L (ref 19–32)
CREATININE: 0.61 mg/dL (ref 0.50–1.10)
Calcium: 8.2 mg/dL — ABNORMAL LOW (ref 8.4–10.5)
GFR calc Af Amer: 88 mL/min — ABNORMAL LOW (ref >90)
GFR calc non Af Amer: 76 mL/min — ABNORMAL LOW (ref >90)
GLUCOSE: 103 mg/dL — AB (ref 70–99)
Potassium: 4.4 mEq/L (ref 3.7–5.3)
Sodium: 137 mEq/L (ref 137–147)
Total Protein: 5.9 g/dL — ABNORMAL LOW (ref 6.0–8.3)

## 2014-05-05 LAB — PROTIME-INR
INR: 1.17 (ref 0.00–1.49)
Prothrombin Time: 14.9 seconds (ref 11.6–15.2)

## 2014-05-05 LAB — CBC WITH DIFFERENTIAL/PLATELET
BASOS PCT: 1 % (ref 0–1)
Basophils Absolute: 0.1 10*3/uL (ref 0.0–0.1)
Eosinophils Absolute: 0.2 10*3/uL (ref 0.0–0.7)
Eosinophils Relative: 1 % (ref 0–5)
HEMATOCRIT: 20.5 % — AB (ref 36.0–46.0)
HEMOGLOBIN: 7.1 g/dL — AB (ref 12.0–15.0)
Lymphocytes Relative: 11 % — ABNORMAL LOW (ref 12–46)
Lymphs Abs: 1.3 10*3/uL (ref 0.7–4.0)
MCH: 29.1 pg (ref 26.0–34.0)
MCHC: 34.6 g/dL (ref 30.0–36.0)
MCV: 84 fL (ref 78.0–100.0)
MONO ABS: 1.2 10*3/uL — AB (ref 0.1–1.0)
MONOS PCT: 10 % (ref 3–12)
NEUTROS ABS: 9.1 10*3/uL — AB (ref 1.7–7.7)
NEUTROS PCT: 77 % (ref 43–77)
Platelets: 501 10*3/uL — ABNORMAL HIGH (ref 150–400)
RBC: 2.44 MIL/uL — AB (ref 3.87–5.11)
RDW: 15.9 % — ABNORMAL HIGH (ref 11.5–15.5)
WBC: 11.7 10*3/uL — ABNORMAL HIGH (ref 4.0–10.5)

## 2014-05-05 LAB — PREPARE RBC (CROSSMATCH)

## 2014-05-05 LAB — POC OCCULT BLOOD, ED: Fecal Occult Bld: POSITIVE — AB

## 2014-05-05 LAB — MRSA PCR SCREENING: MRSA BY PCR: NEGATIVE

## 2014-05-05 MED ORDER — DEXTROSE-NACL 5-0.9 % IV SOLN
INTRAVENOUS | Status: DC
  Administered 2014-05-05: 21:00:00 via INTRAVENOUS

## 2014-05-05 MED ORDER — ONDANSETRON HCL 4 MG PO TABS: 4.0000 mg | ORAL_TABLET | Freq: Four times a day (QID) | ORAL | Status: DC | PRN

## 2014-05-05 MED ORDER — ONDANSETRON HCL 4 MG/2ML IJ SOLN: 4.0000 mg | Freq: Four times a day (QID) | INTRAMUSCULAR | Status: DC | PRN

## 2014-05-05 MED ORDER — SODIUM CHLORIDE 0.9 % IV SOLN
Freq: Once | INTRAVENOUS | Status: AC
  Administered 2014-05-05: 18:00:00 via INTRAVENOUS

## 2014-05-05 MED ORDER — FLUOXETINE HCL 20 MG PO CAPS
20.0000 mg | ORAL_CAPSULE | Freq: Every day | ORAL | Status: DC
  Administered 2014-05-06: 20 mg via ORAL
  Filled 2014-05-05 (×2): qty 1

## 2014-05-05 MED ORDER — QUETIAPINE FUMARATE 25 MG PO TABS
25.0000 mg | ORAL_TABLET | Freq: Every day | ORAL | Status: DC
  Administered 2014-05-05 – 2014-05-06 (×2): 25 mg via ORAL
  Filled 2014-05-05 (×3): qty 1

## 2014-05-05 NOTE — ED Notes (Signed)
Attempted report 

## 2014-05-05 NOTE — Progress Notes (Signed)
Called and received report from ED.  

## 2014-05-05 NOTE — ED Provider Notes (Signed)
CSN: [id number]     Arrival date & time 05/05/14  1609 History   First MD Initiated Contact with Patient 05/05/14 1633     Chief Complaint  Patient presents with  . GI Bleeding   Ashley Burnett is a 80 yo caucasian F w/PMH of diverticulosis with recurrent diverticulitis, osteoporosis, TIA, and recent admission for lower GI bleed requiring 2 transfusions. She presents today with recurrence of rectal bleeding. This is nonpainful. Blood is bright red with diarrhea. One episode occurred today around 3:30 PM. Patient's daughter brought her to the ED for fear that another episode like last time was occurring with severe hypotension and large amount of blood loss. Patient has very mild generalized abdominal pain, achy and crampy in sensation. Denies any ASA or blood thinner use.  She denies LOC, dizziness, syncope/presyncope, CP, SOB, fever, chills, N/V, constipation, hematemesis, dysuria, hematuria, sick contacts, or recent travel.   (Consider location/radiation/quality/duration/timing/severity/associated sxs/prior Treatment) Patient is a 79 y.o. female presenting with hematochezia. The history is provided by the patient.  Rectal Bleeding Quality:  Bright red Amount:  Moderate Duration:  1 day Timing:  Intermittent Progression:  Worsening Chronicity:  Recurrent Context: diarrhea   Similar prior episodes: yes   Relieved by:  Nothing Worsened by:  Nothing tried Ineffective treatments:  None tried Associated symptoms: abdominal pain   Associated symptoms: no dizziness, no fever, no light-headedness, no loss of consciousness, no recent illness and no vomiting   Risk factors: no anticoagulant use     Past Medical History  Diagnosis Date  . Diverticulosis 2001    universal,severe with recurrent diverticulitis.   . Family history of GERD   . Osteoporosis   . Transient ischemic attack   . Fracture     leg wrist  . High cholesterol   . Depression   . Gastritis 2008    seen on EGD  .  Arthritis   . Dementia   . Lower GI bleed 03/2014    likely diverticular bleed.   . Anemia due to blood loss 03/2014   Past Surgical History  Procedure Laterality Date  . Abdominal hysterectomy    . Laparotomy  1960    SBO  . Ovarian cyst removal    . Tonsillectomy    . Appendectomy    . Abdominal adhesion surgery  1993    SBO   . Laparotomy N/A 02/09/2013    Procedure: EXPLORATORY LAPAROTOMY;  Surgeon: Emelia Loron, MD;  Location: [facility] OR;  Service: General;  Laterality: N/A;  . Colon resection N/A 02/09/2013    Procedure: COLON RESECTION;  Surgeon: Emelia Loron, MD;  Location: [facility] OR;  Service: General;  Laterality: N/A;  . Intramedullary (im) nail intertrochanteric Left 01/08/2014    Procedure: INTRAMEDULLARY (IM) NAIL INTERTROCHANTRIC;  Surgeon: Nadara Mustard, MD;  Location: [facility] OR;  Service: Orthopedics;  Laterality: Left;   Family History  Problem Relation Age of Onset  . Stroke    . Colon cancer    . Heart disease      aunt  . Melanoma Mother   . Cancer Mother     melenoma  . Tuberculosis Father    History  Substance Use Topics  . Smoking status: Former Games developer  . Smokeless tobacco: Former Neurosurgeon  . Alcohol Use: No   OB History   Grav Para Term Preterm Abortions TAB SAB Ect Mult Living                 Review of Systems  Constitutional: Negative for fever and chills.  Respiratory: Negative for shortness of breath.   Cardiovascular: Negative for chest pain, palpitations and leg swelling.  Gastrointestinal: Positive for abdominal pain, diarrhea, blood in stool and hematochezia. Negative for nausea, vomiting, constipation and abdominal distention.  Genitourinary: Negative for dysuria, frequency, flank pain and decreased urine volume.  Neurological: Negative for dizziness, loss of consciousness, speech difficulty, light-headedness and headaches.  All other systems reviewed and are negative.     Allergies  Codeine; Indomethacin; and Morphine and  related  Home Medications   Prior to Admission medications   Medication Sig Start Date End Date Taking? Authorizing Provider  acetaminophen (TYLENOL) 325 MG tablet Take 2 tablets (650 mg total) by mouth every 6 (six) hours as needed. 02/17/13  Yes Doristine Mango, PA-C  feeding supplement (ENSURE COMPLETE) LIQD Take 237 mLs by mouth 2 (two) times daily between meals. 02/17/13  Yes Doristine Mango, PA-C  FLUoxetine (PROZAC) 20 MG capsule Take 1 capsule (20 mg total) by mouth daily. 02/17/13  Yes Doristine Mango, PA-C  Multiple Vitamin (MULTIVITAMIN WITH MINERALS) TABS tablet Take 1 tablet by mouth daily. Centrum sliver   Yes Historical Provider, MD  QUEtiapine (SEROQUEL) 25 MG tablet Take 1 tablet (25 mg total) by mouth at bedtime. 04/22/14  Yes Saima Rizwan, MD   BP 133/59  Pulse 87  Temp(Src) 97.8 F (36.6 C) (Oral)  Resp 19  SpO2 100% Physical Exam  Nursing note and vitals reviewed. Constitutional: She appears well-developed and well-nourished. No distress.  HENT:  Head: Normocephalic and atraumatic.  Cardiovascular: Normal rate, regular rhythm, normal heart sounds and intact distal pulses.  Exam reveals no gallop and no friction rub.   No murmur heard. Pulmonary/Chest: Effort normal and breath sounds normal. No respiratory distress. She has no wheezes. She has no rales. She exhibits no tenderness.  Abdominal: Soft. Bowel sounds are normal. She exhibits no distension and no mass. There is no tenderness. There is no rebound and no guarding.  Genitourinary: Guaiac positive stool.  Lymphadenopathy:    She has no cervical adenopathy.  Skin: Skin is warm and dry. She is not diaphoretic.    ED Course  Procedures (including critical care time) Labs Review Labs Reviewed  CBC WITH DIFFERENTIAL - Abnormal; Notable for the following:    WBC 11.7 (*)    RBC 2.44 (*)    Hemoglobin 7.1 (*)    HCT 20.5 (*)    RDW 15.9 (*)    Platelets 501 (*)    Neutro Abs 9.1 (*)    Lymphocytes Relative  11 (*)    Monocytes Absolute 1.2 (*)    All other components within normal limits  COMPREHENSIVE METABOLIC PANEL - Abnormal; Notable for the following:    Glucose, Bld 103 (*)    Calcium 8.2 (*)    Total Protein 5.9 (*)    Albumin 2.2 (*)    GFR calc non Af Amer 76 (*)    GFR calc Af Amer 88 (*)    All other components within normal limits  POC OCCULT BLOOD, ED - Abnormal; Notable for the following:    Fecal Occult Bld POSITIVE (*)    All other components within normal limits  MRSA PCR SCREENING  PROTIME-INR  BASIC METABOLIC PANEL  CBC  CBC  CBC  SAMPLE TO BLOOD BANK  PREPARE RBC (CROSSMATCH)  TYPE AND SCREEN    Imaging Review No results found.   EKG Interpretation None      MDM  [age] yo F w/recurrent lower GI bleed. Please see HPI for details. On exam. Pt in NAD, AFVSS. Patient has very mild tenderness to palpation generalized , No guarding, no masses. While examining the patient she had another episode of diarrhea is grossly bloody. Hemoccult-positive. At this time will obtain CBC, CMP, and PT/INR. Given bolus NS.   CBC shows hemoglobin of 7.1. Patient was typed and crossed and a unit of blood ordered. Consulted GI who are aware patient. Patient was admitted to internal medicine service for continued management and resuscitation. Throughout her  time in the ED patient remained stable.  Final diagnoses:  Anemia due to blood loss, acute  Dementia, without behavioral disturbance  Gastrointestinal hemorrhage, unspecified gastritis, unspecified gastrointestinal hemorrhage type    Pt was seen under the supervision of Dr. Rubin PayorPickering.     Rachelle HoraKeri Everlina Gotts, MD 05/06/14 [phone]  Rachelle HoraKeri Maykayla Highley, MD 05/06/14 [phone]

## 2014-05-05 NOTE — ED Notes (Signed)
Pt reports to the ED via [facility] for eval of rectal bleeding. The bleeding began approx 2 hours ago and is bright red in nature with clots present. Pt hypotensive on arrival and had a bright red, watery BM. Pt pale but skin warm and dry. Pt was admitted 10 days ago for hx of same and was given 2 blood transfusions. Per caregiver pt has had decreased PO intake since leaving the hospital. Pt complaining of nausea and LLQ abd pain and discomfort. No active vomiting reported. Pt oriented to person, place, and situation. She is NSR on the monitor. Pt disoriented to time.

## 2014-05-05 NOTE — H&P (Addendum)
[facility] History and Physical  Ashley Burnett [id number] DOB: October 31, 1917 DOA: 05/05/2014  PCP: Ashley FreudYvonne Lowne, DO   Chief Complaint: GI bleed  HPI: Ashley Burnett is a 80 y.o. female with dementia, GERD, Diverticulosis with recent bleeding who comes in to the ER for recurrent rectal bleeding starting this afternoon. In the ER she passed a large amount of blood mixed with stool as well. I am having difficulty obtaining a history as she states that she cant remember today's events. She tells me she lives alone but per the RN, she has a 24 hr caretaker at home. I have spoken with her daughter Ashley Burnett who confirms that her mother has severe dementia and does have a 24 hr caretaker.   ROS:  Unreliable due to dementia  Past Medical History  Diagnosis Date  . Diverticulosis 2001    universal,severe with recurrent diverticulitis.   . Family history of GERD   . Osteoporosis   . Transient ischemic attack   . Fracture     leg wrist  . High cholesterol   . Depression   . Gastritis 2008    seen on EGD  . Arthritis   . Dementia   . Lower GI bleed 03/2014    likely diverticular bleed.   . Anemia due to blood loss 03/2014    Past Surgical History  Procedure Laterality Date  . Abdominal hysterectomy    . Laparotomy  1960    [facility]  . Ovarian cyst removal    . Tonsillectomy    . Appendectomy    . Abdominal adhesion surgery  1993    [facility]   . Laparotomy N/A 02/09/2013    Procedure: EXPLORATORY LAPAROTOMY;  Surgeon: Emelia LoronMatthew Wakefield, MD;  [facility];  Service: General;  Laterality: N/A;  . Colon resection N/A 02/09/2013    Procedure: COLON RESECTION;  Surgeon: Emelia LoronMatthew Wakefield, MD;  [facility];  Service: General;  Laterality: N/A;  . Intramedullary (im) nail intertrochanteric Left 01/08/2014    Procedure: INTRAMEDULLARY (IM) NAIL INTERTROCHANTRIC;  Surgeon: Nadara MustardMarcus V Duda, MD;  Location: [facility] OR;  Service: Orthopedics;  Laterality: Left;    Social History: non-smoker, occasionally  drinks ETOH Lives at home with caretaker     Allergies  Allergen Reactions  . Codeine Other (See Comments)    unknown  . Indomethacin Other (See Comments)    Unknown. Spoke to granddaughter on 02/05/13 who stated she was not aware of this allergy and that her grandmother (Ashley Burnett) does not remember this allergy either.   . Morphine And Related Other (See Comments)    unknown    Family History  Problem Relation Age of Onset  . Stroke    . Colon cancer    . Heart disease      aunt  . Melanoma Mother   . Cancer Mother     melenoma  . Tuberculosis Father       Prior to Admission medications   Medication Sig Start Date End Date Taking? Authorizing Provider  acetaminophen (TYLENOL) 325 MG tablet Take 2 tablets (650 mg total) by mouth every 6 (six) hours as needed. 02/17/13  Yes Doristine MangoElizabeth White, PA-C  feeding supplement (ENSURE COMPLETE) LIQD Take 237 mLs by mouth 2 (two) times daily between meals. 02/17/13  Yes Doristine MangoElizabeth White, PA-C  FLUoxetine (PROZAC) 20 MG capsule Take 1 capsule (20 mg total) by mouth daily. 02/17/13  Yes Doristine MangoElizabeth White, PA-C  Multiple Vitamin (MULTIVITAMIN WITH MINERALS) TABS tablet Take 1 tablet by  mouth daily. Centrum sliver   Yes Historical Provider, MD  QUEtiapine (SEROQUEL) 25 MG tablet Take 1 tablet (25 mg total) by mouth at bedtime. 04/22/14  Yes Calvert Cantor, MD     Physical Exam: Filed Vitals:   05/05/14 1745 05/05/14 1800 05/05/14 1815 05/05/14 1830  BP: 132/58 122/60 123/58 125/61  Pulse: 84 84 85 86  Temp:      TempSrc:      Resp: 20 20 20 14   SpO2: 99% 96% 97% 98%     General: AAO x 3, no distress HEENT: Normocephalic and Atraumatic, Mucous membranes pink                PERRLA; EOM intact; No scleral icterus,                 Nares: Patent, Oropharynx: Clear, Fair Dentition                 Neck: FROM, no cervical lymphadenopathy, thyromegaly, carotid bruit or JVD;  Breasts: deferred CHEST WALL: No tenderness  CHEST: Normal respiration,  clear to auscultation bilaterally  HEART: Regular rate and rhythm; no murmurs rubs or gallops  BACK: No kyphosis or scoliosis; no CVA tenderness  ABDOMEN: Positive Bowel Sounds, soft, mildly tender in left lower quadrent; no masses, no organomegaly Rectal Exam: deferred EXTREMITIES: No cyanosis, clubbing, or edema Genitalia: not examined  SKIN:  no rash or ulceration  CNS: Alert and Oriented x 4, Nonfocal exam, CN 2-12 intact  Labs on Admission:  Basic Metabolic Panel:  Recent Labs Lab 05/05/14 1700  NA 137  K 4.4  CL 101  CO2 24  GLUCOSE 103*  BUN 21  CREATININE 0.61  CALCIUM 8.2*   Liver Function Tests:  Recent Labs Lab 05/05/14 1700  AST 18  ALT 17  ALKPHOS 102  BILITOT 0.3  PROT 5.9*  ALBUMIN 2.2*   No results found for this basename: LIPASE, AMYLASE,  in the last 168 hours No results found for this basename: AMMONIA,  in the last 168 hours CBC:  Recent Labs Lab 05/05/14 1700  WBC 11.7*  NEUTROABS 9.1*  HGB 7.1*  HCT 20.5*  MCV 84.0  PLT 501*   Cardiac Enzymes: No results found for this basename: CKTOTAL, CKMB, CKMBINDEX, TROPONINI,  in the last 168 hours  BNP (last 3 results) No results found for this basename: PROBNP,  in the last 8760 hours CBG: No results found for this basename: GLUCAP,  in the last 168 hours  Radiological Exams on Admission: No results found.  EKG: ordered  Assessment/Plan Principal Problem:   GI bleed - likely diverticular once again- conservative management - follow H and H closely - clear liquid diet  Active Problems:   Anemia due to blood loss, acute  Anxiety / depression - cont home meds    Dementia - monitor for behavioral disturbances    Consulted: none  Code Status: DNR based upon discussion with daughter Family Communication: daughter Ashley Burnett DVT Prophylaxis:SCDs  Time spent: >45 min  Mar Walmer, MD [facility]  If 7PM-7AM, please contact  night-coverage [email or web address] 05/05/2014, 6:47 PM

## 2014-05-06 DIAGNOSIS — F411 Generalized anxiety disorder: Secondary | ICD-10-CM

## 2014-05-06 DIAGNOSIS — K5730 Diverticulosis of large intestine without perforation or abscess without bleeding: Secondary | ICD-10-CM

## 2014-05-06 LAB — CBC
HCT: 25.8 % — ABNORMAL LOW (ref 36.0–46.0)
HCT: 27.4 % — ABNORMAL LOW (ref 36.0–46.0)
Hemoglobin: 8.6 g/dL — ABNORMAL LOW (ref 12.0–15.0)
Hemoglobin: 9.3 g/dL — ABNORMAL LOW (ref 12.0–15.0)
MCH: 28.5 pg (ref 26.0–34.0)
MCH: 28.4 pg (ref 26.0–34.0)
MCHC: 33.3 g/dL (ref 30.0–36.0)
MCHC: 33.9 g/dL (ref 30.0–36.0)
MCV: 85.4 fL (ref 78.0–100.0)
MCV: 83.8 fL (ref 78.0–100.0)
PLATELETS: 512 10*3/uL — AB (ref 150–400)
Platelets: 440 10*3/uL — ABNORMAL HIGH (ref 150–400)
RBC: 3.02 MIL/uL — ABNORMAL LOW (ref 3.87–5.11)
RBC: 3.27 MIL/uL — ABNORMAL LOW (ref 3.87–5.11)
RDW: 15.5 % (ref 11.5–15.5)
RDW: 15.4 % (ref 11.5–15.5)
WBC: 8.6 10*3/uL (ref 4.0–10.5)
WBC: 10.4 10*3/uL (ref 4.0–10.5)

## 2014-05-06 LAB — BASIC METABOLIC PANEL
ANION GAP: 11 (ref 5–15)
BUN: 16 mg/dL (ref 6–23)
CALCIUM: 8 mg/dL — AB (ref 8.4–10.5)
CO2: 21 mEq/L (ref 19–32)
Chloride: 105 mEq/L (ref 96–112)
Creatinine, Ser: 0.52 mg/dL (ref 0.50–1.10)
GFR calc Af Amer: >90 mL/min (ref >90)
GFR, EST NON AFRICAN AMERICAN: 80 mL/min — AB (ref >90)
GLUCOSE: 123 mg/dL — AB (ref 70–99)
Potassium: 3.7 mEq/L (ref 3.7–5.3)
SODIUM: 137 meq/L (ref 137–147)

## 2014-05-06 LAB — TYPE AND SCREEN
ABO/RH(D): A POS
ANTIBODY SCREEN: NEGATIVE
UNIT DIVISION: 0

## 2014-05-06 MED ORDER — ACETAMINOPHEN 325 MG PO TABS
650.0000 mg | ORAL_TABLET | Freq: Four times a day (QID) | ORAL | Status: DC | PRN
  Filled 2014-05-06: qty 2

## 2014-05-06 MED ORDER — ONDANSETRON HCL 4 MG/2ML IJ SOLN: 4.0000 mg | Freq: Four times a day (QID) | INTRAMUSCULAR | Status: DC | PRN

## 2014-05-06 NOTE — Progress Notes (Signed)
[facility] PROGRESS NOTE  Ashley ReeJoy Stormes ZOX:[id number] DOB: Dec 04, 1917 DOA: 05/05/2014 PCP: [name], DO  Assessment/Plan: GI bleed  - likely diverticular, no further bleeding since last evening - conservative management, monitor CBC - transfuse PRN  - clear liquid diet   Anemia due to blood loss, acute  -s/p 1unit PRBC -monitor and transfuse PRN  Anxiety / depression  - cont zoloft   Dementia  - monitor for behavioral disturbances, continue seroquel  DVT proph: SCDs  Code Status: DNR Family Communication: none at bedside Disposition Plan: home tomorrow if stable  HPI/Subjective: Feels ok, except mild abd discomfort  Objective: Filed Vitals:   05/06/14 1158  BP:   Pulse:   Temp: 98.1 F (36.7 C)  Resp:     Intake/Output Summary (Last 24 hours) at 05/06/14 1207 Last data filed at 05/06/14 1159  Gross per 24 hour  Intake   1640 ml  Output    551 ml  Net   1089 ml   Filed Weights   05/05/14 2010  Weight: 41.3 kg (91 lb 0.8 oz)    Exam:   General:  AAOxself onlu  Cardiovascular: S1S2/RRR  Respiratory: CTAB  Abdomen: soft, Nt, BS present  Musculoskeletal: no edema c/c   Data Reviewed: Basic Metabolic Panel:  Recent Labs Lab 05/05/14 1700 05/06/14 0433  NA 137 137  K 4.4 3.7  CL 101 105  CO2 24 21  GLUCOSE 103* 123*  BUN 21 16  CREATININE 0.61 0.52  CALCIUM 8.2* 8.0*   Liver Function Tests:  Recent Labs Lab 05/05/14 1700  AST 18  ALT 17  ALKPHOS 102  BILITOT 0.3  PROT 5.9*  ALBUMIN 2.2*   No results found for this basename: LIPASE, AMYLASE,  in the last 168 hours No results found for this basename: AMMONIA,  in the last 168 hours CBC:  Recent Labs Lab 05/05/14 1700 05/06/14 0433  WBC 11.7* 8.6  NEUTROABS 9.1*  --   HGB 7.1* 8.6*  HCT 20.5* 25.8*  MCV 84.0 85.4  PLT 501* 440*   Cardiac Enzymes: No results found for this basename: CKTOTAL, CKMB, CKMBINDEX, TROPONINI,  in the last 168 hours BNP (last 3  results) No results found for this basename: PROBNP,  in the last 8760 hours CBG: No results found for this basename: GLUCAP,  in the last 168 hours  Recent Results (from the past 240 hour(s))  MRSA PCR SCREENING     Status: None   Collection Time    05/05/14  8:26 PM      Result Value Ref Range Status   MRSA by PCR NEGATIVE  NEGATIVE Final   Comment:            The GeneXpert MRSA Assay (FDA     approved for NASAL specimens     only), is one component of a     comprehensive MRSA colonization     surveillance program. It is not     intended to diagnose MRSA     infection nor to guide or     monitor treatment for     MRSA infections.     Studies: No results found.  Scheduled Meds: . FLUoxetine  20 mg Oral Daily  . QUEtiapine  25 mg Oral QHS   Continuous Infusions:  Antibiotics Given (last 72 hours)   None      Principal Problem:   GI bleed Active Problems:   Dementia   Anemia due to blood loss, acute  Lower GI bleed    Time spent:    [facility]  [facility] Pager [phone]. If 7PM-7AM, please contact night-coverage at [email or web address], password [facility] 05/06/2014, 12:07 PM  LOS: 1 day

## 2014-05-06 NOTE — ED Provider Notes (Signed)
I saw and evaluated the patient, reviewed the resident's note and I agree with the findings and plan.   EKG Interpretation None     Patient with GI bleeding. History same and required ICU admission. Some anemia at this time. Will admit to internal medicine.  Juliet RudeNathan R. Rubin PayorPickering, MD 05/06/14 [phone]

## 2014-05-07 LAB — CBC
HCT: 27 % — ABNORMAL LOW (ref 36.0–46.0)
Hemoglobin: 9.1 g/dL — ABNORMAL LOW (ref 12.0–15.0)
MCH: 28.4 pg (ref 26.0–34.0)
MCHC: 33.7 g/dL (ref 30.0–36.0)
MCV: 84.4 fL (ref 78.0–100.0)
PLATELETS: 461 10*3/uL — AB (ref 150–400)
RBC: 3.2 MIL/uL — ABNORMAL LOW (ref 3.87–5.11)
RDW: 15.4 % (ref 11.5–15.5)
WBC: 8.6 10*3/uL (ref 4.0–10.5)

## 2014-05-07 LAB — BASIC METABOLIC PANEL
ANION GAP: 12 (ref 5–15)
BUN: 5 mg/dL — ABNORMAL LOW (ref 6–23)
CO2: 20 mEq/L (ref 19–32)
Calcium: 8.6 mg/dL (ref 8.4–10.5)
Chloride: 101 mEq/L (ref 96–112)
Creatinine, Ser: 0.47 mg/dL — ABNORMAL LOW (ref 0.50–1.10)
GFR, EST NON AFRICAN AMERICAN: 83 mL/min — AB (ref >90)
GLUCOSE: 94 mg/dL (ref 70–99)
POTASSIUM: 4 meq/L (ref 3.7–5.3)
SODIUM: 133 meq/L — AB (ref 137–147)

## 2014-05-07 NOTE — Progress Notes (Signed)
Discharge instructions given. No questions or concerns at this time.  

## 2014-05-07 NOTE — Discharge Summary (Signed)
Physician Discharge Summary  Ashley Burnett [id number] DOB: 1918/05/21 DOA: 05/05/2014  PCP: Ashley FreudYvonne Lowne, DO  Admit date: 05/05/2014 Discharge date: 05/07/2014  Time spent: 35 minutes  Recommendations for Outpatient Follow-up:  1. PCP [name] in 1 week  Discharge Diagnoses:  Principal Problem:   GI bleed   Diverticular bleed   Dementia   Anemia due to blood loss, acute   Lower GI bleed   Discharge Condition: stable  Diet recommendation: regular diet  Filed Weights   05/05/14 2010 05/07/14 0419  Weight: 41.3 kg (91 lb 0.8 oz) 41.2 kg (90 lb 13.3 oz)    History of present illness:  89/F with Dementia and diverticulosis and recent admission for diverticular bleed was brought to ER following an episode of BRBPR 8/14 am  Hospital Course:  GI bleed  - likely diverticular, no further bleeding for 48hours - conservative management, hb stable after transfusion on day of admission - clinically stable -followed by GI 10days ago for same, conservative management followed then too.  Anemia due to blood loss, acute  -s/p 1unit PRBC 8/14 -Hb stable since  Anxiety / depression   - cont zoloft   Dementia  - continue seroquel due to h/o behavioural discturbances   Discharge Exam: Filed Vitals:   05/07/14 0700  BP: 151/68  Pulse: 82  Temp: 98.5 F (36.9 C)  Resp: 20    General: AAOx3 Cardiovascular: S1S2/RRR Respiratory: CTAB  Discharge Instructions You were cared for by a hospitalist during your hospital stay. If you have any questions about your discharge medications or the care you received while you were in the hospital after you are discharged, you can call the unit and asked to speak with the hospitalist on call if the hospitalist that took care of you is not available. Once you are discharged, your primary care physician will handle any further medical issues. Please note that NO REFILLS for any discharge medications will be authorized once you are discharged, as  it is imperative that you return to your primary care physician (or establish a relationship with a primary care physician if you do not have one) for your aftercare needs so that they can reassess your need for medications and monitor your lab values.  Discharge Instructions   Diet general    Complete by:  As directed      Increase activity slowly    Complete by:  As directed             Medication List         acetaminophen 325 MG tablet  Commonly known as:  TYLENOL  Take 2 tablets (650 mg total) by mouth every 6 (six) hours as needed.     feeding supplement (ENSURE COMPLETE) Liqd  Take 237 mLs by mouth 2 (two) times daily between meals.     FLUoxetine 20 MG capsule  Commonly known as:  PROZAC  Take 1 capsule (20 mg total) by mouth daily.     multivitamin with minerals Tabs tablet  Take 1 tablet by mouth daily. Centrum sliver     QUEtiapine 25 MG tablet  Commonly known as:  SEROQUEL  Take 1 tablet (25 mg total) by mouth at bedtime.       Allergies  Allergen Reactions  . Codeine Other (See Comments)    unknown  . Indomethacin Other (See Comments)    Unknown. Spoke to granddaughter on 02/05/13 who stated she was not aware of this allergy and that her grandmother (Ashley Burnett) does  not remember this allergy either.   . Morphine And Related Other (See Comments)    unknown       Follow-up Information   Follow up with Ashley Freud, DO. Schedule an appointment as soon as possible for a visit in 1 week.   Specialty:  Family Medicine   Contact information:   [id number] [place] [address] [phone]        The results of significant diagnostics from this hospitalization (including imaging, microbiology, ancillary and laboratory) are listed below for reference.    Significant Diagnostic Studies: Dg Chest Port 1 View  04/19/2014   CLINICAL DATA:  Weakness.  Gastrointestinal bleeding.  EXAM: PORTABLE CHEST - 1 VIEW  COMPARISON:  01/07/2014  FINDINGS:  Extensive artifact overlies the chest. Heart size is normal. There is calcification of the thoracic aorta. The lungs are hyperinflated in clear. There is a linear scar at the right lung base. Tiny amount of pleural fluid is not excluded. No active parenchymal pathology. No pulmonary edema. No significant bony finding.  IMPRESSION: No active disease. Pulmonary hyperinflation. Cannot rule out small amount of pleural fluid.   Electronically Signed   By: Paulina Fusi M.D.   On: 04/19/2014 07:11    Microbiology: Recent Results (from the past 240 hour(s))  MRSA PCR SCREENING     Status: None   Collection Time    05/05/14  8:26 PM      Result Value Ref Range Status   MRSA by PCR NEGATIVE  NEGATIVE Final   Comment:            The GeneXpert MRSA Assay (FDA     approved for NASAL specimens     only), is one component of a     comprehensive MRSA colonization     surveillance program. It is not     intended to diagnose MRSA     infection nor to guide or     monitor treatment for     MRSA infections.     Labs: Basic Metabolic Panel:  Recent Labs Lab 05/05/14 1700 05/06/14 0433 05/07/14 0330  NA 137 137 133*  K 4.4 3.7 4.0  CL 101 105 101  CO2 24 21 20   GLUCOSE 103* 123* 94  BUN 21 16 5*  CREATININE 0.61 0.52 0.47*  CALCIUM 8.2* 8.0* 8.6   Liver Function Tests:  Recent Labs Lab 05/05/14 1700  AST 18  ALT 17  ALKPHOS 102  BILITOT 0.3  PROT 5.9*  ALBUMIN 2.2*   No results found for this basename: LIPASE, AMYLASE,  in the last 168 hours No results found for this basename: AMMONIA,  in the last 168 hours CBC:  Recent Labs Lab 05/05/14 1700 05/06/14 0433 05/06/14 1650 05/07/14 0330  WBC 11.7* 8.6 10.4 8.6  NEUTROABS 9.1*  --   --   --   HGB 7.1* 8.6* 9.3* 9.1*  HCT 20.5* 25.8* 27.4* 27.0*  MCV 84.0 85.4 83.8 84.4  PLT 501* 440* 512* 461*   Cardiac Enzymes: No results found for this basename: CKTOTAL, CKMB, CKMBINDEX, TROPONINI,  in the last 168 hours BNP: BNP (last  3 results) No results found for this basename: PROBNP,  in the last 8760 hours CBG: No results found for this basename: GLUCAP,  in the last 168 hours     Signed:  Sotirios Burnett  [facility] 05/07/2014, 9:13 AM

## 2014-11-07 ENCOUNTER — Encounter (HOSPITAL_COMMUNITY): Payer: Self-pay | Admitting: Radiology

## 2014-11-07 ENCOUNTER — Emergency Department (HOSPITAL_COMMUNITY): Payer: Medicare Other

## 2014-11-07 ENCOUNTER — Inpatient Hospital Stay (HOSPITAL_COMMUNITY)
Admission: EM | Admit: 2014-11-07 | Discharge: 2014-11-11 | DRG: 388 | Disposition: A | Discharge status: Nursing Home (Includes ICF) | Payer: Medicare Other | Attending: Internal Medicine | Admitting: Internal Medicine

## 2014-11-07 DIAGNOSIS — K5660 Unspecified intestinal obstruction: Principal | ICD-10-CM | POA: Diagnosis present

## 2014-11-07 DIAGNOSIS — Z808 Family history of malignant neoplasm of other organs or systems: Secondary | ICD-10-CM

## 2014-11-07 DIAGNOSIS — Z79899 Other long term (current) drug therapy: Secondary | ICD-10-CM | POA: Diagnosis not present

## 2014-11-07 DIAGNOSIS — D638 Anemia in other chronic diseases classified elsewhere: Secondary | ICD-10-CM | POA: Diagnosis present

## 2014-11-07 DIAGNOSIS — Z87891 Personal history of nicotine dependence: Secondary | ICD-10-CM | POA: Diagnosis not present

## 2014-11-07 DIAGNOSIS — E43 Unspecified severe protein-calorie malnutrition: Secondary | ICD-10-CM | POA: Diagnosis present

## 2014-11-07 DIAGNOSIS — F0390 Unspecified dementia without behavioral disturbance: Secondary | ICD-10-CM | POA: Diagnosis present

## 2014-11-07 DIAGNOSIS — M810 Age-related osteoporosis without current pathological fracture: Secondary | ICD-10-CM | POA: Diagnosis present

## 2014-11-07 DIAGNOSIS — K5730 Diverticulosis of large intestine without perforation or abscess without bleeding: Secondary | ICD-10-CM | POA: Diagnosis present

## 2014-11-07 DIAGNOSIS — Z681 Body mass index (BMI) 19 or less, adult: Secondary | ICD-10-CM

## 2014-11-07 DIAGNOSIS — F329 Major depressive disorder, single episode, unspecified: Secondary | ICD-10-CM | POA: Diagnosis present

## 2014-11-07 DIAGNOSIS — Z8673 Personal history of transient ischemic attack (TIA), and cerebral infarction without residual deficits: Secondary | ICD-10-CM | POA: Diagnosis not present

## 2014-11-07 DIAGNOSIS — R1013 Epigastric pain: Secondary | ICD-10-CM | POA: Diagnosis not present

## 2014-11-07 DIAGNOSIS — F32A Depression, unspecified: Secondary | ICD-10-CM | POA: Diagnosis present

## 2014-11-07 DIAGNOSIS — Z800 Family history of malignant neoplasm of digestive organs: Secondary | ICD-10-CM | POA: Diagnosis not present

## 2014-11-07 DIAGNOSIS — K5669 Other intestinal obstruction: Secondary | ICD-10-CM

## 2014-11-07 DIAGNOSIS — K56609 Unspecified intestinal obstruction, unspecified as to partial versus complete obstruction: Secondary | ICD-10-CM | POA: Diagnosis present

## 2014-11-07 DIAGNOSIS — Z66 Do not resuscitate: Secondary | ICD-10-CM | POA: Diagnosis present

## 2014-11-07 LAB — BASIC METABOLIC PANEL
Anion gap: 8 (ref 5–15)
BUN: 11 mg/dL (ref 6–23)
CO2: 26 mmol/L (ref 19–32)
CREATININE: 0.59 mg/dL (ref 0.50–1.10)
Calcium: 8.9 mg/dL (ref 8.4–10.5)
Chloride: 99 mmol/L (ref 96–112)
GFR calc non Af Amer: 76 mL/min — ABNORMAL LOW (ref >90)
GFR, EST AFRICAN AMERICAN: 88 mL/min — AB (ref >90)
Glucose, Bld: 116 mg/dL — ABNORMAL HIGH (ref 70–99)
Potassium: 5.1 mmol/L (ref 3.5–5.1)
Sodium: 133 mmol/L — ABNORMAL LOW (ref 135–145)

## 2014-11-07 LAB — HEPATIC FUNCTION PANEL
ALBUMIN: 4 g/dL (ref 3.5–5.2)
ALK PHOS: 81 U/L (ref 39–117)
ALT: 15 U/L (ref 0–35)
AST: 39 U/L — AB (ref 0–37)
BILIRUBIN TOTAL: 1.3 mg/dL — AB (ref 0.3–1.2)
Bilirubin, Direct: 0.4 mg/dL (ref 0.0–0.5)
Indirect Bilirubin: 0.9 mg/dL (ref 0.3–0.9)
TOTAL PROTEIN: 7.1 g/dL (ref 6.0–8.3)

## 2014-11-07 LAB — I-STAT TROPONIN, ED: Troponin i, poc: 0 ng/mL (ref 0.00–0.08)

## 2014-11-07 LAB — URINALYSIS, ROUTINE W REFLEX MICROSCOPIC
Bilirubin Urine: NEGATIVE
Glucose, UA: NEGATIVE mg/dL
Hgb urine dipstick: NEGATIVE
Ketones, ur: NEGATIVE mg/dL
Leukocytes, UA: NEGATIVE
Nitrite: NEGATIVE
PH: 8 (ref 5.0–8.0)
Protein, ur: NEGATIVE mg/dL
Specific Gravity, Urine: 1.011 (ref 1.005–1.030)
Urobilinogen, UA: 1 mg/dL (ref 0.0–1.0)

## 2014-11-07 LAB — I-STAT CG4 LACTIC ACID, ED
LACTIC ACID, VENOUS: 0.75 mmol/L (ref 0.5–2.0)
Lactic Acid, Venous: 1.85 mmol/L (ref 0.5–2.0)

## 2014-11-07 LAB — CBC
HEMATOCRIT: 33.2 % — AB (ref 36.0–46.0)
HEMOGLOBIN: 11 g/dL — AB (ref 12.0–15.0)
MCH: 28.6 pg (ref 26.0–34.0)
MCHC: 33.1 g/dL (ref 30.0–36.0)
MCV: 86.5 fL (ref 78.0–100.0)
PLATELETS: 305 10*3/uL (ref 150–400)
RBC: 3.84 MIL/uL — ABNORMAL LOW (ref 3.87–5.11)
RDW: 16.2 % — ABNORMAL HIGH (ref 11.5–15.5)
WBC: 7.7 10*3/uL (ref 4.0–10.5)

## 2014-11-07 LAB — LIPASE, BLOOD: Lipase: 31 U/L (ref 11–59)

## 2014-11-07 LAB — POC OCCULT BLOOD, ED: FECAL OCCULT BLD: POSITIVE — AB

## 2014-11-07 LAB — CREATININE, SERUM
CREATININE: 0.5 mg/dL (ref 0.50–1.10)
GFR calc Af Amer: >90 mL/min (ref >90)
GFR calc non Af Amer: 80 mL/min — ABNORMAL LOW (ref >90)

## 2014-11-07 MED ORDER — IOHEXOL 300 MG/ML  SOLN
80.0000 mL | Freq: Once | INTRAMUSCULAR | Status: AC | PRN
  Administered 2014-11-07: 80 mL via INTRAVENOUS

## 2014-11-07 MED ORDER — FAMOTIDINE IN NACL 20-0.9 MG/50ML-% IV SOLN
20.0000 mg | Freq: Once | INTRAVENOUS | Status: AC
  Administered 2014-11-07: 20 mg via INTRAVENOUS
  Filled 2014-11-07: qty 50

## 2014-11-07 MED ORDER — ONDANSETRON HCL 4 MG PO TABS: 4.0000 mg | ORAL_TABLET | Freq: Four times a day (QID) | ORAL | Status: DC | PRN

## 2014-11-07 MED ORDER — HYDROMORPHONE HCL 1 MG/ML IJ SOLN
0.5000 mg | INTRAMUSCULAR | Status: DC | PRN
  Administered 2014-11-07: 0.5 mg via INTRAVENOUS
  Filled 2014-11-07: qty 1

## 2014-11-07 MED ORDER — ENOXAPARIN SODIUM 30 MG/0.3ML ~~LOC~~ SOLN
30.0000 mg | SUBCUTANEOUS | Status: DC
  Administered 2014-11-07 – 2014-11-10 (×4): 30 mg via SUBCUTANEOUS
  Filled 2014-11-07 (×5): qty 0.3

## 2014-11-07 MED ORDER — SODIUM CHLORIDE 0.9 % IV SOLN
INTRAVENOUS | Status: AC
  Administered 2014-11-07: 17:00:00 via INTRAVENOUS

## 2014-11-07 MED ORDER — OXYCODONE-ACETAMINOPHEN 5-325 MG PO TABS
1.0000 | ORAL_TABLET | Freq: Once | ORAL | Status: AC
  Administered 2014-11-07: 1 via ORAL
  Filled 2014-11-07: qty 1

## 2014-11-07 MED ORDER — ACETAMINOPHEN 325 MG PO TABS: 650.0000 mg | ORAL_TABLET | Freq: Four times a day (QID) | ORAL | Status: DC | PRN

## 2014-11-07 MED ORDER — ACETAMINOPHEN 650 MG RE SUPP: 650.0000 mg | Freq: Four times a day (QID) | RECTAL | Status: DC | PRN

## 2014-11-07 MED ORDER — IOHEXOL 300 MG/ML  SOLN
50.0000 mL | Freq: Once | INTRAMUSCULAR | Status: AC | PRN
  Administered 2014-11-07: 50 mL via ORAL

## 2014-11-07 MED ORDER — HYDROMORPHONE HCL 1 MG/ML IJ SOLN: 0.5000 mg | INTRAMUSCULAR | Status: DC | PRN

## 2014-11-07 MED ORDER — SODIUM CHLORIDE 0.9 % IV SOLN
INTRAVENOUS | Status: DC
  Administered 2014-11-07 – 2014-11-09 (×4): via INTRAVENOUS
  Administered 2014-11-09: 1000 mL via INTRAVENOUS
  Administered 2014-11-11: 03:00:00 via INTRAVENOUS

## 2014-11-07 MED ORDER — ONDANSETRON HCL 4 MG/2ML IJ SOLN: 4.0000 mg | Freq: Four times a day (QID) | INTRAMUSCULAR | Status: DC | PRN

## 2014-11-07 NOTE — Consult Note (Signed)
Scot Dock 1918/04/13  [id number].   Requesting MD: Dr. Cristal Ford  Chief Complaint/Reason for Consult: SBO HPI: This is a 81 yo white female with dementia who has had a prior laparotomy with SBR for SBO.  She is not able to provide much history.  Her granddaughter is present and states she was told by the ALF that the patient began having abdominal pain at some point overnight or this morning.  She had not had any nausea or vomiting.  Unsure when she last moved her bowels or passed flatus.  She was brought to the [facility] where she had a CT scan that revealed a SBO.  We have been asked to evaluate the patient for further recommendations.  ROS : Please see HPI, otherwise unable to fully obtain due to dementia.   Family History  Problem Relation Age of Onset  . Stroke    . Colon cancer    . Heart disease      aunt  . Melanoma Mother   . Cancer Mother     melenoma  . Tuberculosis Father     Past Medical History  Diagnosis Date  . Diverticulosis 2001    universal,severe with recurrent diverticulitis.   . Family history of GERD   . Osteoporosis   . Transient ischemic attack   . Fracture     leg wrist  . High cholesterol   . Depression   . Gastritis 2008    seen on EGD  . Arthritis   . Dementia   . Lower GI bleed 03/2014    likely diverticular bleed.   . Anemia due to blood loss 03/2014    Past Surgical History  Procedure Laterality Date  . Abdominal hysterectomy    . Laparotomy  1960    SBO  . Ovarian cyst removal    . Tonsillectomy    . Appendectomy    . Abdominal adhesion surgery  1993    SBO   . Laparotomy N/A 02/09/2013    Procedure: EXPLORATORY LAPAROTOMY;  Surgeon: Rolm Bookbinder, MD;  Location: [place];  Service: General;  Laterality: N/A;  . Colon resection N/A 02/09/2013    Procedure: COLON RESECTION;  Surgeon: Rolm Bookbinder, MD;  Location: [place];  Service: General;  Laterality: N/A;  . Intramedullary (im) nail intertrochanteric Left 01/08/2014     Procedure: INTRAMEDULLARY (IM) NAIL INTERTROCHANTRIC;  Surgeon: Newt Minion, MD;  Location: [place];  Service: Orthopedics;  Laterality: Left;    Social History:  reports that she has quit smoking. She has quit using smokeless tobacco. She reports that she does not drink alcohol or use illicit drugs.  Allergies:  Allergies  Allergen Reactions  . Amoxicillin     On MAR  . Codeine Other (See Comments)    unknown  . Indomethacin Other (See Comments)    Unknown. Spoke to granddaughter on 02/05/13 who stated she was not aware of this allergy and that her grandmother (Ms. Hauser) does not remember this allergy either.   . Morphine And Related Other (See Comments)    unknown     (Not in a hospital admission)  Blood pressure 171/81, pulse 71, temperature 97.4 F (36.3 C), resp. rate 22, SpO2 96 %. Physical Exam: General: pleasant, skinny white female who is laying in bed in NAD HEENT: head is normocephalic, atraumatic.  Sclera are noninjected.  PERRL.  Ears and nose without any masses or lesions.  Mouth is pink and moist Heart: regular, rate, and rhythm.  Normal  s1,s2. No obvious murmurs, gallops, or rubs noted.  Palpable radial and pedal pulses bilaterally Lungs: CTAB, no wheezes, rhonchi, or rales noted.  Respiratory effort nonlabored Abd: soft, mild LLQ tenderness, ND, +BS, no masses, hernias, or organomegaly Skin: warm and dry with no masses, lesions, or rashes Psych: Alert, but somewhat confused.  Appropriate affect    Results for orders placed or performed during the hospital encounter of 11/07/14 (from the past 48 hour(s))  Basic metabolic panel     Status: Abnormal   Collection Time: 11/07/14  1:52 PM  Result Value Ref Range   Sodium 133 (L) 135 - 145 mmol/L   Potassium 5.1 3.5 - 5.1 mmol/L   Chloride 99 96 - 112 mmol/L   CO2 26 19 - 32 mmol/L   Glucose, Bld 116 (H) 70 - 99 mg/dL   BUN 11 6 - 23 mg/dL   Creatinine, Ser 0.59 0.50 - 1.10 mg/dL   Calcium 8.9 8.4 - 10.5 mg/dL    GFR calc non Af Amer 76 (L) >90 mL/min   GFR calc Af Amer 88 (L) >90 mL/min    Comment: (NOTE) The eGFR has been calculated using the CKD EPI equation. This calculation has not been validated in all clinical situations. eGFR's persistently <90 mL/min signify possible Chronic Kidney Disease.    Anion gap 8 5 - 15  Hepatic function panel     Status: Abnormal   Collection Time: 11/07/14  1:52 PM  Result Value Ref Range   Total Protein 7.1 6.0 - 8.3 g/dL   Albumin 4.0 3.5 - 5.2 g/dL   AST 39 (H) 0 - 37 U/L   ALT 15 0 - 35 U/L   Alkaline Phosphatase 81 39 - 117 U/L   Total Bilirubin 1.3 (H) 0.3 - 1.2 mg/dL   Bilirubin, Direct 0.4 0.0 - 0.5 mg/dL   Indirect Bilirubin 0.9 0.3 - 0.9 mg/dL  Lipase, blood     Status: None   Collection Time: 11/07/14  1:52 PM  Result Value Ref Range   Lipase 31 11 - 59 U/L  I-stat troponin, ED     Status: None   Collection Time: 11/07/14  2:01 PM  Result Value Ref Range   Troponin i, poc 0.00 0.00 - 0.08 ng/mL   Comment 3            Comment: Due to the release kinetics of cTnI, a negative result within the first hours of the onset of symptoms does not rule out myocardial infarction with certainty. If myocardial infarction is still suspected, repeat the test at appropriate intervals.   I-Stat CG4 Lactic Acid, ED     Status: None   Collection Time: 11/07/14  2:03 PM  Result Value Ref Range   Lactic Acid, Venous 1.85 0.5 - 2.0 mmol/L  Urinalysis, Routine w reflex microscopic     Status: Abnormal   Collection Time: 11/07/14  3:24 PM  Result Value Ref Range   Color, Urine YELLOW YELLOW   APPearance HAZY (A) CLEAR   Specific Gravity, Urine 1.011 1.005 - 1.030   pH 8.0 5.0 - 8.0   Glucose, UA NEGATIVE NEGATIVE mg/dL   Hgb urine dipstick NEGATIVE NEGATIVE   Bilirubin Urine NEGATIVE NEGATIVE   Ketones, ur NEGATIVE NEGATIVE mg/dL   Protein, ur NEGATIVE NEGATIVE mg/dL   Urobilinogen, UA 1.0 0.0 - 1.0 mg/dL   Nitrite NEGATIVE NEGATIVE   Leukocytes,  UA NEGATIVE NEGATIVE    Comment: MICROSCOPIC NOT DONE ON URINES WITH NEGATIVE  PROTEIN, BLOOD, LEUKOCYTES, NITRITE, OR GLUCOSE <1000 mg/dL.  POC occult blood, ED Provider will collect     Status: Abnormal   Collection Time: 11/07/14  3:34 PM  Result Value Ref Range   Fecal Occult Bld POSITIVE (A) NEGATIVE   Ct Abdomen Pelvis W Contrast  11/07/2014   CLINICAL DATA:  81 year old female with abdominal pain, epigastric pain. Initial encounter.  EXAM: CT ABDOMEN AND PELVIS WITH CONTRAST  TECHNIQUE: Multidetector CT imaging of the abdomen and pelvis was performed using the standard protocol following bolus administration of intravenous contrast.  CONTRAST:  37m OMNIPAQUE IOHEXOL 300 MG/ML SOLN, 525mOMNIPAQUE IOHEXOL 300 MG/ML SOLN  COMPARISON:  02/04/2013 and earlier.  FINDINGS: No pericardial or pleural effusion. Stable mild lung base atelectasis or scarring.  Osteopenia. Advanced degenerative changes in the spine. Pectus excavatum. Interval ORIF of the proximal left femur. No acute osseous abnormality identified.  No pelvic free fluid identified. Gas in the rectum. Mildly distended but unremarkable bladder. Sigmoid diverticulosis, with no sigmoid wall thickening or mesenteric stranding identified. The left colon is decompressed. The splenic flexure and transverse colon are decompressed. Small volume of gas and stool in the right colon. The cecum is located in the midline of the anterior pelvis.  There are then Fluid-filled small bowel loops measuring up to 3 cm diameter. The terminal ileum is decompressed. There appears to be in abrupt transition point in the midline of the pelvis near the 31 mm diameter loop annotated on series 2, image 48. The stomach is fairly decompressed. The first portion of the duodenum is chronically distended, but the distal duodenum then is within normal limits. There is an anastomosis in the left upper quadrant small bowel. Oral contrast has progressed pass this point.  No abdominal  free fluid or free air identified. Liver, gallbladder, spleen, pancreas, adrenal glands, and kidneys are within normal limits. Portal venous system is patent. Aortoiliac calcified atherosclerosis noted. Major arterial structures are patent. No lymphadenopathy identified.  IMPRESSION: 1. Small bowel obstruction in the lower abdomen and pelvis. Abrupt transition point occurs near the dilated loop annotated on series 2, image 48. 2. The upstream small bowel also is relatively decompressed, but there is no strong evidence of a closed loop obstruction at this time (e.g. no bowel wall thickening, mesenteric edema, or free fluid). 3. Previous left upper quadrant small bowel resection and reanastomosis. 4. Sigmoid diverticulosis with no active inflammation.   Electronically Signed   By: H Genevie Ann.D.   On: 11/07/2014 15:56   Dg Abd Acute W/chest  11/07/2014   CLINICAL DATA:  Lower abdominal pain. Pain worse on the RIGHT side. Nausea and constipation.  EXAM: ACUTE ABDOMEN SERIES (ABDOMEN 2 VIEW & CHEST 1 VIEW)  COMPARISON:  None.  FINDINGS: Emphysema is present in the chest. No free air underneath the hemidiaphragms. Aortic arch atherosclerosis.  No free air underneath the hemidiaphragms. Bowel staples are present in the LEFT mid abdomen. The bowel gas pattern is normal. No pathologic air-fluid levels. No dilated loops of large or small bowel to suggest obstruction. Gas is present within the rectosigmoid.  LEFT hip gamma nail fixation is incidentally noted. Tortuous abdominal aorta with aortoiliac atherosclerosis.  IMPRESSION: 1. Normal bowel gas pattern. 2. Emphysema.   Electronically Signed   By: GeDereck Ligas.D.   On: 11/07/2014 14:23       Assessment/Plan 1. SBO 2. Dementia 3. Deconditioning  Plan: 1. The granddaughter has stated that if the patient were to need it, they  would NOT choose to pursue an operation.  She does not have any nausea or vomiting.  Her stomach is decompressed.  We will hold off on  an NGT currently.  We will treat her conservatively for now.  Repeat films in the morning.  Agree with medical admission.  D/W Dr. Ree Kida.  OSBORNE,KELLY E 11/07/2014, 4:35 PM Pager: [phone]  Agree with above. The family's plan is appropriate. Will sign off.  Alphonsa Overall, MD, [facility] Pager: [phone] Office phone:  [phone]

## 2014-11-07 NOTE — ED Notes (Signed)
Bed: WA03 Expected date:  Expected time:  Means of arrival:  Comments: EMS 

## 2014-11-07 NOTE — ED Provider Notes (Signed)
[facility] Dr. Catha GosselinMikHail, medsurg. PT known to be DNR at last visit. Family members present previously but not currently in the room. Pt seen by Honeywellichole Pisciotta, PA-C who spoke with general surgery regarding positive CT scan abd/pelv for SBO- they have agreed to consult on patient. I assumed care of the patient and made the call for admission at change of shift.  Filed Vitals:   11/07/14 1531  BP: 171/81  Pulse: 71  Temp:   Resp: [address]22     Dawood Spitler G Abdiel Blackerby, PA-C 11/07/14 1623  Dorthula Matasiffany G Kayd Launer, PA-C 11/07/14 1624

## 2014-11-07 NOTE — ED Notes (Signed)
[facility], per sataff pt co epigastric pain non radiating, tender on palpation. Hx dementia. Pt alert and oriented to person only. Pt's today's confusion is normal to base line

## 2014-11-07 NOTE — ED Notes (Signed)
Attempted to call report , receiving RN unavailble

## 2014-11-07 NOTE — H&P (Signed)
[facility] History and Physical  Ashley Burnett ZOX:[id number] DOB: March 29, 1918 DOA: 11/07/2014  Referring physician: Ms. Ellin Saba, PA PCP: Loreen Freud, DO  Specialists:   Chief Complaint: Abdominal pain  HPI: Ashley Burnett is a 81 y.o. female  With a history of diverticulosis, dementia, that presented to the emergency department with complaints of abdominal pain. History was given by patient's granddaughter who is at bedside. Patient resides at an assisted living facility and began to have abdominal pain last night. Patient did not have any nausea or vomiting. Patient is unable to tell me whether she's had any bowel movements or flat ascending the past 24 hours. Patient currently denies any chest pain, shortness of breath, dizziness, headache. Patient states she does not want to have surgery. In the emergency department, patient did have CT scan of the abdomen and pelvis which did show small bowel obstruction. General surgery was consulted. [facility] was asked to admit.  Of note, patient has had abdominal surgeries in the past including abdominal hysterectomy, expiratory laparotomy, colon resection.  Review of Systems: Limited due to patient's dementia Complains of abdominal pain.  Past Medical History  Diagnosis Date  . Diverticulosis 2001    universal,severe with recurrent diverticulitis.   . Family history of GERD   . Osteoporosis   . Transient ischemic attack   . Fracture     leg wrist  . High cholesterol   . Depression   . Gastritis 2008    seen on EGD  . Arthritis   . Dementia   . Lower GI bleed 03/2014    likely diverticular bleed.   . Anemia due to blood loss 03/2014   Past Surgical History  Procedure Laterality Date  . Abdominal hysterectomy    . Laparotomy  1960    SBO  . Ovarian cyst removal    . Tonsillectomy    . Appendectomy    . Abdominal adhesion surgery  1993    SBO   . Laparotomy N/A 02/09/2013    Procedure: EXPLORATORY LAPAROTOMY;  Surgeon: Emelia Loron, MD;  Location: [facility] OR;  Service: General;  Laterality: N/A;  . Colon resection N/A 02/09/2013    Procedure: COLON RESECTION;  Surgeon: Emelia Loron, MD;  [facility] OR;  Service: General;  Laterality: N/A;  . Intramedullary (im) nail intertrochanteric Left 01/08/2014    Procedure: INTRAMEDULLARY (IM) NAIL INTERTROCHANTRIC;  Surgeon: Nadara Mustard, MD;  Location: [facility] OR;  Service: Orthopedics;  Laterality: Left;   Social History:  reports that she has quit smoking. She has quit using smokeless tobacco. She reports that she does not drink alcohol or use illicit drugs.  Allergies  Allergen Reactions  . Amoxicillin     On MAR  . Codeine Other (See Comments)    unknown  . Indomethacin Other (See Comments)    Unknown. Spoke to granddaughter on 02/05/13 who stated she was not aware of this allergy and that her grandmother (Ms. Human) does not remember this allergy either.   . Morphine And Related Other (See Comments)    unknown    Family History  Problem Relation Age of Onset  . Stroke    . Colon cancer    . Heart disease      aunt  . Melanoma Mother   . Cancer Mother     melenoma  . Tuberculosis Father     Prior to Admission medications   Medication Sig Start Date End Date Taking? Authorizing Provider  FLUoxetine (PROZAC) 20 MG capsule  Take 1 capsule (20 mg total) by mouth daily. 02/17/13  Yes Doristine MangoElizabeth White, PA-C  HYDROcodone-acetaminophen (NORCO/VICODIN) 5-325 MG per tablet Take 1 tablet by mouth every 12 (twelve) hours as needed for moderate pain.   Yes Historical Provider, MD  QUEtiapine (SEROQUEL) 25 MG tablet Take 1 tablet (25 mg total) by mouth at bedtime. 04/22/14  Yes Calvert CantorSaima Rizwan, MD  zolpidem (AMBIEN) 5 MG tablet Take 5 mg by mouth at bedtime as needed for sleep.   Yes Historical Provider, MD  acetaminophen (TYLENOL) 325 MG tablet Take 2 tablets (650 mg total) by mouth every 6 (six) hours as needed. Patient not taking: Reported on 11/07/2014 02/17/13   Doristine MangoElizabeth  White, PA-C  feeding supplement (ENSURE COMPLETE) LIQD Take 237 mLs by mouth 2 (two) times daily between meals. Patient not taking: Reported on 11/07/2014 02/17/13   Doristine MangoElizabeth White, PA-C   Physical Exam: Ceasar MonsFiled Vitals:   11/07/14 1531  BP: 171/81  Pulse: 71  Temp:   Resp: 22     General: Well developed, thin, NAD, appears stated age  HEENT: NCAT, PERRLA, EOMI, Anicteic Sclera, mucous membranes moist.   Cardiovascular: S1 S2 auscultated, no rubs, murmurs or gallops. Regular rate and rhythm.  Respiratory: Clear to auscultation bilaterally with equal chest rise  Abdomen: Soft, mild LLQ tenderness with palpation, nondistended, + bowel sounds  Extremities: warm dry without cyanosis clubbing or edema  Neuro: AAOx2, cranial nerves grossly intact. Strength equal and bilateral in upper/lower ext  Skin: Without rashes exudates or nodules  Psych: Normal affect and demeanor with intact judgement and insight  Labs on Admission:  Basic Metabolic Panel:  Recent Labs Lab 11/07/14 1352  NA 133*  K 5.1  CL 99  CO2 26  GLUCOSE 116*  BUN 11  CREATININE 0.59  CALCIUM 8.9   Liver Function Tests:  Recent Labs Lab 11/07/14 1352  AST 39*  ALT 15  ALKPHOS 81  BILITOT 1.3*  PROT 7.1  ALBUMIN 4.0    Recent Labs Lab 11/07/14 1352  LIPASE 31   No results for input(s): AMMONIA in the last 168 hours. CBC: No results for input(s): WBC, NEUTROABS, HGB, HCT, MCV, PLT in the last 168 hours. Cardiac Enzymes: No results for input(s): CKTOTAL, CKMB, CKMBINDEX, TROPONINI in the last 168 hours.  BNP (last 3 results) No results for input(s): BNP in the last 8760 hours.  ProBNP (last 3 results) No results for input(s): PROBNP in the last 8760 hours.  CBG: No results for input(s): GLUCAP in the last 168 hours.  Radiological Exams on Admission: Ct Abdomen Pelvis W Contrast  11/07/2014   CLINICAL DATA:  81 year old female with abdominal pain, epigastric pain. Initial encounter.   EXAM: CT ABDOMEN AND PELVIS WITH CONTRAST  TECHNIQUE: Multidetector CT imaging of the abdomen and pelvis was performed using the standard protocol following bolus administration of intravenous contrast.  CONTRAST:  80mL OMNIPAQUE IOHEXOL 300 MG/ML SOLN, 50mL OMNIPAQUE IOHEXOL 300 MG/ML SOLN  COMPARISON:  02/04/2013 and earlier.  FINDINGS: No pericardial or pleural effusion. Stable mild lung base atelectasis or scarring.  Osteopenia. Advanced degenerative changes in the spine. Pectus excavatum. Interval ORIF of the proximal left femur. No acute osseous abnormality identified.  No pelvic free fluid identified. Gas in the rectum. Mildly distended but unremarkable bladder. Sigmoid diverticulosis, with no sigmoid wall thickening or mesenteric stranding identified. The left colon is decompressed. The splenic flexure and transverse colon are decompressed. Small volume of gas and stool in the right colon. The cecum is  located in the midline of the anterior pelvis.  There are then Fluid-filled small bowel loops measuring up to 3 cm diameter. The terminal ileum is decompressed. There appears to be in abrupt transition point in the midline of the pelvis near the 31 mm diameter loop annotated on series 2, image 48. The stomach is fairly decompressed. The first portion of the duodenum is chronically distended, but the distal duodenum then is within normal limits. There is an anastomosis in the left upper quadrant small bowel. Oral contrast has progressed pass this point.  No abdominal free fluid or free air identified. Liver, gallbladder, spleen, pancreas, adrenal glands, and kidneys are within normal limits. Portal venous system is patent. Aortoiliac calcified atherosclerosis noted. Major arterial structures are patent. No lymphadenopathy identified.  IMPRESSION: 1. Small bowel obstruction in the lower abdomen and pelvis. Abrupt transition point occurs near the dilated loop annotated on series 2, image 48. 2. The upstream small  bowel also is relatively decompressed, but there is no strong evidence of a closed loop obstruction at this time (e.g. no bowel wall thickening, mesenteric edema, or free fluid). 3. Previous left upper quadrant small bowel resection and reanastomosis. 4. Sigmoid diverticulosis with no active inflammation.   Electronically Signed   By: Odessa Fleming M.D.   On: 11/07/2014 15:56   Dg Abd Acute W/chest  11/07/2014   CLINICAL DATA:  Lower abdominal pain. Pain worse on the RIGHT side. Nausea and constipation.  EXAM: ACUTE ABDOMEN SERIES (ABDOMEN 2 VIEW & CHEST 1 VIEW)  COMPARISON:  None.  FINDINGS: Emphysema is present in the chest. No free air underneath the hemidiaphragms. Aortic arch atherosclerosis.  No free air underneath the hemidiaphragms. Bowel staples are present in the LEFT mid abdomen. The bowel gas pattern is normal. No pathologic air-fluid levels. No dilated loops of large or small bowel to suggest obstruction. Gas is present within the rectosigmoid.  LEFT hip gamma nail fixation is incidentally noted. Tortuous abdominal aorta with aortoiliac atherosclerosis.  IMPRESSION: 1. Normal bowel gas pattern. 2. Emphysema.   Electronically Signed   By: Andreas Newport M.D.   On: 11/07/2014 14:23    EKG: Independently reviewed. Sinus rhythm, rate 75  Assessment/Plan  Abdominal pain secondary to small bowel obstruction -Patient will be admitted to medical floor -CT scan of the abdomen pelvis shows small bowel obstruction L lower abdomen and pelvis, abrupt transition point -Gen. surgery consulted and appreciated -Family at bedside as well as patient do not want surgery at this time -Will continue with conservative management including IVF, antiemetics, pain control -Discussed the surgery, will hold NG tube at this time as patient does not have any nausea or vomiting  Dementia -Appears to be stable  Diverticulosis -Appears to be stable, CT scan noted diverticulosis without  diverticulitis  Depression -Will hold Prozac and Seroquel at this time  Protein calorie malnutrition -Patient is on feeding supplementation at home however at this time patient will be NPO -If and when small bowel obstruction improves, nutrition should be consulted  DVT prophylaxis: Lovenox  Code Status: DNR  Condition: Guarded  Family Communication: Family at bedside. Admission, patients condition and plan of care including tests being ordered have been discussed with the patient and family who indicate understanding and agree with the plan and Code Status.  Disposition Plan: Admitted.   Time spent: 60 minutes  Bethany Cumming D.O. [facility] Pager [phone]  If 7PM-7AM, please contact night-coverage [email or web address] Password [facility] 11/07/2014, 4:46 PM

## 2014-11-07 NOTE — ED Provider Notes (Signed)
CSN: [id number]     Arrival date & time 11/07/14  1316 History   First MD Initiated Contact with Patient 11/07/14 1320     Chief Complaint  Patient presents with  . Abdominal Pain     (Consider location/radiation/quality/duration/timing/severity/associated sxs/prior Treatment) HPI   Ashley Burnett is a 81 y.o. female brought in by EMS from [facility], accompanied by her daughter and granddaughter complaining of epigastric pain onset this morning. As per family members there has been no nausea and vomiting, patient is mentating at her baseline. She has a history of diverticular lower GI bleed. No known fever or chills. Patient denies chest pain or shortness of breath. Level V caveat secondary to dementia.  Past Medical History  Diagnosis Date  . Diverticulosis 2001    universal,severe with recurrent diverticulitis.   . Family history of GERD   . Osteoporosis   . Transient ischemic attack   . Fracture     leg wrist  . High cholesterol   . Depression   . Gastritis 2008    seen on EGD  . Arthritis   . Dementia   . Lower GI bleed 03/2014    likely diverticular bleed.   . Anemia due to blood loss 03/2014   Past Surgical History  Procedure Laterality Date  . Abdominal hysterectomy    . Laparotomy  1960    SBO  . Ovarian cyst removal    . Tonsillectomy    . Appendectomy    . Abdominal adhesion surgery  1993    SBO   . Laparotomy N/A 02/09/2013    Procedure: EXPLORATORY LAPAROTOMY;  Surgeon: Emelia Loron, MD;  Location: [facility] OR;  Service: General;  Laterality: N/A;  . Colon resection N/A 02/09/2013    Procedure: COLON RESECTION;  Surgeon: Emelia Loron, MD;  Location: [facility] OR;  Service: General;  Laterality: N/A;  . Intramedullary (im) nail intertrochanteric Left 01/08/2014    Procedure: INTRAMEDULLARY (IM) NAIL INTERTROCHANTRIC;  Surgeon: Nadara Mustard, MD;  Location: [facility] OR;  Service: Orthopedics;  Laterality: Left;   Family History  Problem Relation Age of Onset  . Stroke     . Colon cancer    . Heart disease      aunt  . Melanoma Mother   . Cancer Mother     melenoma  . Tuberculosis Father    History  Substance Use Topics  . Smoking status: Former Games developer  . Smokeless tobacco: Former Neurosurgeon  . Alcohol Use: No   OB History    No data available     Review of Systems  10 systems reviewed and found to be negative, except as noted in the HPI.   Allergies  Amoxicillin; Codeine; Indomethacin; and Morphine and related  Home Medications   Prior to Admission medications   Medication Sig Start Date End Date Taking? Authorizing Provider  FLUoxetine (PROZAC) 20 MG capsule Take 1 capsule (20 mg total) by mouth daily. 02/17/13  Yes Doristine Mango, PA-C  HYDROcodone-acetaminophen (NORCO/VICODIN) 5-325 MG per tablet Take 1 tablet by mouth every 12 (twelve) hours as needed for moderate pain.   Yes Historical Provider, MD  QUEtiapine (SEROQUEL) 25 MG tablet Take 1 tablet (25 mg total) by mouth at bedtime. 04/22/14  Yes Calvert Cantor, MD  zolpidem (AMBIEN) 5 MG tablet Take 5 mg by mouth at bedtime as needed for sleep.   Yes Historical Provider, MD  acetaminophen (TYLENOL) 325 MG tablet Take 2 tablets (650 mg total) by mouth every 6 (six)  hours as needed. Patient not taking: Reported on 11/07/2014 02/17/13   Doristine MangoElizabeth White, PA-C  feeding supplement (ENSURE COMPLETE) LIQD Take 237 mLs by mouth 2 (two) times daily between meals. Patient not taking: Reported on 11/07/2014 02/17/13   Doristine MangoElizabeth White, PA-C   BP 171/81 mmHg  Pulse 71  Temp(Src) 97.4 F (36.3 C)  Resp 22  SpO2 96% Physical Exam  Constitutional: She appears well-developed and well-nourished. No distress.  HENT:  Head: Normocephalic.  Eyes: Conjunctivae and EOM are normal. Pupils are equal, round, and reactive to light.  Neck: Normal range of motion.  Cardiovascular: Normal rate, regular rhythm and intact distal pulses.   Pulmonary/Chest: Effort normal and breath sounds normal. No stridor. No respiratory  distress. She has no wheezes. She has no rales. She exhibits no tenderness.  Abdominal: Soft. Bowel sounds are normal. She exhibits no distension and no mass. There is tenderness. There is no rebound and no guarding.  Mild, diffuse tenderness to palpation with no guarding or rebound.  Murphy sign negative, no tenderness to palpation over McBurney's point, Rovsings, Psoas and obturator all negative.   Musculoskeletal: Normal range of motion.  Neurological: She is alert.  Psychiatric: She has a normal mood and affect.  Nursing note and vitals reviewed.   ED Course  Procedures (including critical care time) Labs Review Labs Reviewed  BASIC METABOLIC PANEL - Abnormal; Notable for the following:    Sodium 133 (*)    Glucose, Bld 116 (*)    GFR calc non Af Amer 76 (*)    GFR calc Af Amer 88 (*)    All other components within normal limits  HEPATIC FUNCTION PANEL - Abnormal; Notable for the following:    AST 39 (*)    Total Bilirubin 1.3 (*)    All other components within normal limits  URINALYSIS, ROUTINE W REFLEX MICROSCOPIC - Abnormal; Notable for the following:    APPearance HAZY (*)    All other components within normal limits  POC OCCULT BLOOD, ED - Abnormal; Notable for the following:    Fecal Occult Bld POSITIVE (*)    All other components within normal limits  LIPASE, BLOOD  CBC WITH DIFFERENTIAL/PLATELET  I-STAT CG4 LACTIC ACID, ED  I-STAT TROPOININ, ED  I-STAT CG4 LACTIC ACID, ED    Imaging Review Ct Abdomen Pelvis W Contrast  11/07/2014   CLINICAL DATA:  81 year old female with abdominal pain, epigastric pain. Initial encounter.  EXAM: CT ABDOMEN AND PELVIS WITH CONTRAST  TECHNIQUE: Multidetector CT imaging of the abdomen and pelvis was performed using the standard protocol following bolus administration of intravenous contrast.  CONTRAST:  80mL OMNIPAQUE IOHEXOL 300 MG/ML SOLN, 50mL OMNIPAQUE IOHEXOL 300 MG/ML SOLN  COMPARISON:  02/04/2013 and earlier.  FINDINGS: No  pericardial or pleural effusion. Stable mild lung base atelectasis or scarring.  Osteopenia. Advanced degenerative changes in the spine. Pectus excavatum. Interval ORIF of the proximal left femur. No acute osseous abnormality identified.  No pelvic free fluid identified. Gas in the rectum. Mildly distended but unremarkable bladder. Sigmoid diverticulosis, with no sigmoid wall thickening or mesenteric stranding identified. The left colon is decompressed. The splenic flexure and transverse colon are decompressed. Small volume of gas and stool in the right colon. The cecum is located in the midline of the anterior pelvis.  There are then Fluid-filled small bowel loops measuring up to 3 cm diameter. The terminal ileum is decompressed. There appears to be in abrupt transition point in the midline of the pelvis near  the 31 mm diameter loop annotated on series 2, image 48. The stomach is fairly decompressed. The first portion of the duodenum is chronically distended, but the distal duodenum then is within normal limits. There is an anastomosis in the left upper quadrant small bowel. Oral contrast has progressed pass this point.  No abdominal free fluid or free air identified. Liver, gallbladder, spleen, pancreas, adrenal glands, and kidneys are within normal limits. Portal venous system is patent. Aortoiliac calcified atherosclerosis noted. Major arterial structures are patent. No lymphadenopathy identified.  IMPRESSION: 1. Small bowel obstruction in the lower abdomen and pelvis. Abrupt transition point occurs near the dilated loop annotated on series 2, image 48. 2. The upstream small bowel also is relatively decompressed, but there is no strong evidence of a closed loop obstruction at this time (e.g. no bowel wall thickening, mesenteric edema, or free fluid). 3. Previous left upper quadrant small bowel resection and reanastomosis. 4. Sigmoid diverticulosis with no active inflammation.   Electronically Signed   By: Odessa Fleming M.D.   On: 11/07/2014 15:56   Dg Abd Acute W/chest  11/07/2014   CLINICAL DATA:  Lower abdominal pain. Pain worse on the RIGHT side. Nausea and constipation.  EXAM: ACUTE ABDOMEN SERIES (ABDOMEN 2 VIEW & CHEST 1 VIEW)  COMPARISON:  None.  FINDINGS: Emphysema is present in the chest. No free air underneath the hemidiaphragms. Aortic arch atherosclerosis.  No free air underneath the hemidiaphragms. Bowel staples are present in the LEFT mid abdomen. The bowel gas pattern is normal. No pathologic air-fluid levels. No dilated loops of large or small bowel to suggest obstruction. Gas is present within the rectosigmoid.  LEFT hip gamma nail fixation is incidentally noted. Tortuous abdominal aorta with aortoiliac atherosclerosis.  IMPRESSION: 1. Normal bowel gas pattern. 2. Emphysema.   Electronically Signed   By: Andreas Newport M.D.   On: 11/07/2014 14:23     EKG Interpretation None      MDM   Final diagnoses:  Small bowel obstruction    Filed Vitals:   11/07/14 1318 11/07/14 1527 11/07/14 1531  BP: 172/75 168/99 171/81  Pulse: 66 69 71  Temp: 97.4 F (36.3 C)    Resp:   22  SpO2: 98% 96% 96%    Medications  HYDROmorphone (DILAUDID) injection 0.5 mg (not administered)  oxyCODONE-acetaminophen (PERCOCET/ROXICET) 5-325 MG per tablet 1 tablet (1 tablet Oral Given 11/07/14 1346)  iohexol (OMNIPAQUE) 300 MG/ML solution 50 mL (50 mLs Oral Contrast Given 11/07/14 1536)  famotidine (PEPCID) IVPB 20 mg (20 mg Intravenous New Bag/Given 11/07/14 1525)  iohexol (OMNIPAQUE) 300 MG/ML solution 80 mL (80 mLs Intravenous Contrast Given 11/07/14 1535)    Ashley Burnett is a pleasant 81 y.o. female with dementia presenting with abdominal pain. Patient is afebrile and well-appearing. Abdominal exam is nonsurgical.  Acute abdominal series is negative. Guaiac is positive however it is been positive multiple times in the past. CT abdomen pelvis shows small bowel obstruction with abrupt transition point. Case  discussed with [facility] PA Tresa Endo. Recommend medical admission and they will consult.  Case signed out to PA Neva Seat at shift change: Plan is to admit the patient to hospitalist.     Wynetta Emery, PA-C 11/07/14 1618  Mirian Mo, MD 11/10/14 2101

## 2014-11-08 LAB — BASIC METABOLIC PANEL
ANION GAP: 8 (ref 5–15)
BUN: 9 mg/dL (ref 6–23)
CALCIUM: 8.7 mg/dL (ref 8.4–10.5)
CHLORIDE: 102 mmol/L (ref 96–112)
CO2: 25 mmol/L (ref 19–32)
Creatinine, Ser: 0.46 mg/dL — ABNORMAL LOW (ref 0.50–1.10)
GFR calc Af Amer: >90 mL/min (ref >90)
GFR calc non Af Amer: 83 mL/min — ABNORMAL LOW (ref >90)
Glucose, Bld: 87 mg/dL (ref 70–99)
POTASSIUM: 3.6 mmol/L (ref 3.5–5.1)
Sodium: 135 mmol/L (ref 135–145)

## 2014-11-08 LAB — CBC
HEMATOCRIT: 33.6 % — AB (ref 36.0–46.0)
Hemoglobin: 11 g/dL — ABNORMAL LOW (ref 12.0–15.0)
MCH: 28.4 pg (ref 26.0–34.0)
MCHC: 32.7 g/dL (ref 30.0–36.0)
MCV: 86.6 fL (ref 78.0–100.0)
PLATELETS: 314 10*3/uL (ref 150–400)
RBC: 3.88 MIL/uL (ref 3.87–5.11)
RDW: 16.4 % — ABNORMAL HIGH (ref 11.5–15.5)
WBC: 5.6 10*3/uL (ref 4.0–10.5)

## 2014-11-08 NOTE — Progress Notes (Signed)
Patient ID: Ashley Burnett, female   DOB: [birth year], 81 y.o.   MRN: [id number]  [facility] PROGRESS NOTE  Ashley Burnett [id number] DOB: [birth year] DOA: 11/07/2014 PCP: [name], DO   Brief narrative:    81 y.o. female with diverticulosis, dementia, [facility] with main concern of several days duration of progressively worsening epigastric abdominal pain, poor oral intake, nausea. Patient is unable to explain [facility] and per initial history, granddaughter at bedside was providing most of the details at the time of the admission. Patient denied any chest pain or shortness of breath, no specific urinary concerns, no dizziness or headaches.  In emergency department, patient noted to be hemodynamically stable, vital signs stable, CT scan of the abdomen and pelvis consistent with small bowel obstruction. Surgery was consulted and tried hospitalist asked to admit for further evaluation. Of note patient has history of abdominal surgeries in the past including abdominal hysterectomy, exploratory laparotomy and colon resection.  Assessment/Plan:    Principal Problem:   SBO (small bowel obstruction) - Patient reports feeling better this morning, again not sure what brought her to emergency department - I discussed with her findings of small bowel extraction, she denies abdominal pain this morning and would like to have her diet advanced - We'll plan on advancing to clear liquid diet and see how she does - No plan for intervention, focus on conservative management Active Problems:   Dementia - Appears to be stable at this time, patient alert and oriented to name, place   Depression - Clinically stable   Protein-calorie malnutrition, severe - Considering small bowel obstruction, slowly advancing diet, advance to clear liquids this morning   Anemia of chronic disease, iron deficiency - No signs of active bleeding, hemoglobin stable  at 11   Sigmoid diverticulosis - Noted on CT abdomen and pelvis, patient with known history of diverticulosis - No active inflammation noted   DVT prophylaxis - Lovenox subcutaneous  Code Status: DNR Family Communication:  plan of care discussed with the patient Disposition Plan: Patient is from assisted living facility, will need PT evaluation for further recommendations on appropriate discharge  IV access:  Peripheral IV  Procedures and diagnostic studies:    Ct Abdomen Pelvis W Contrast  11/07/2014   Small bowel obstruction in the lower abdomen and pelvis. The upstream small bowel also is relatively decompressed, but there is no strong evidence of a closed loop obstruction. Previous left upper quadrant small bowel resection and reanastomosis. Sigmoid diverticulosis with no active inflammation.   Dg Abd Acute W/chest  11/07/2014    Normal bowel gas pattern. 2. Emphysema.    Medical Consultants:  Surgery  Other Consultants:  PT evaluation  IAnti-Infectives:   None   Debbora PrestoMAGICK-Gilberto Streck, MD  [facility] Pager [phone]  If 7PM-7AM, please contact night-coverage [email or web address] Password [email or web address] 11/08/2014, 11:33 AM   LOS: 1 day   HPI/Subjective: No events overnight.   Objective: Filed Vitals:   11/07/14 1531 11/07/14 1748 11/07/14 2253 11/08/14 0550  BP: 171/81 147/71 145/72 159/76  Pulse: 71 69 63 70  Temp:  97.6 F (36.4 C) 98 F (36.7 C) 98.2 F (36.8 C)  TempSrc:  Oral Oral Oral  Resp: 22 20 20 16   Height:  5\' 4"  (1.626 m)    Weight:  37 kg (81 lb 9.1 oz)  37 kg (81 lb 9.1 oz)  SpO2: 96% 99% 98% 96%    Intake/Output Summary (Last 24 hours) at 11/08/14  1133 Last data filed at 11/08/14 0647  Gross per 24 hour  Intake    970 ml  Output    300 ml  Net    670 ml    Exam:   General:  Pt is alert, follows commands appropriately, not in acute distress  Cardiovascular: Regular rate and rhythm, no rubs, no gallops  Respiratory: Clear to auscultation bilaterally, no  wheezing, no crackles, no rhonchi  Abdomen: Soft, non tender, non distended, bowel sounds present, no guarding  Extremities:  pulses DP and PT palpable bilaterally  Neuro: Grossly nonfocal  Data Reviewed: Basic Metabolic Panel:  Recent Labs Lab 11/07/14 1352 11/07/14 1930 11/08/14 0515  NA 133*  --  135  K 5.1  --  3.6  CL 99  --  102  CO2 26  --  25  GLUCOSE 116*  --  87  BUN 11  --  9  CREATININE 0.59 0.50 0.46*  CALCIUM 8.9  --  8.7   Liver Function Tests:  Recent Labs Lab 11/07/14 1352  AST 39*  ALT 15  ALKPHOS 81  BILITOT 1.3*  PROT 7.1  ALBUMIN 4.0    Recent Labs Lab 11/07/14 1352  LIPASE 31   CBC:  Recent Labs Lab 11/07/14 1930 11/08/14 0515  WBC 7.7 5.6  HGB 11.0* 11.0*  HCT 33.2* 33.6*  MCV 86.5 86.6  PLT 305 314    Scheduled Meds: . enoxaparin (LOVENOX) injection  30 mg Subcutaneous Q24H   Continuous Infusions: . sodium chloride 75 mL/hr at 11/08/14 1610

## 2014-11-08 NOTE — Care Management Note (Signed)
CARE MANAGEMENT NOTE 11/08/2014  Patient:  Bing ReeFARLOW,Elenna   Account Number:  [id number]  Date Initiated:  11/08/2014  Documentation initiated by:  Sandford CrazeLEMENTS,Kayia Billinger  Subjective/Objective Assessment:   81 yo admitted with SBO. Hx of diverticulosis, dementia     Action/Plan:   From [facility]   Anticipated DC Date:  11/11/2014   Anticipated DC Plan:  ASSISTED LIVING / REST HOME  In-house referral  Clinical Social Worker      DC Planning Services  CM consult      Choice offered to / List presented to:             Status of service:  In process, will continue to follow Medicare Important Message given?   (If response is "NO", the following Medicare IM given date fields will be blank) Date Medicare IM given:   Medicare IM given by:   Date Additional Medicare IM given:   Additional Medicare IM given by:    Discharge Disposition:    Per UR Regulation:  Reviewed for med. necessity/level of care/duration of stay  If discussed at Long Length of Stay Meetings, dates discussed:    Comments:  11/08/14 Sandford Crazeora Curry Dulski RN,BSN,NCM [phone] Chart reviewed.  Pt from ALF and PT evaluation has been ordered for DC planning.  No CM needs identified at this time.  CM will continue to follow.

## 2014-11-09 LAB — BASIC METABOLIC PANEL
ANION GAP: 7 (ref 5–15)
BUN: 6 mg/dL (ref 6–23)
CHLORIDE: 103 mmol/L (ref 96–112)
CO2: 25 mmol/L (ref 19–32)
Calcium: 8.4 mg/dL (ref 8.4–10.5)
Creatinine, Ser: 0.4 mg/dL — ABNORMAL LOW (ref 0.50–1.10)
GFR, EST NON AFRICAN AMERICAN: 86 mL/min — AB (ref >90)
Glucose, Bld: 100 mg/dL — ABNORMAL HIGH (ref 70–99)
Potassium: 3.6 mmol/L (ref 3.5–5.1)
Sodium: 135 mmol/L (ref 135–145)

## 2014-11-09 LAB — CBC
HEMATOCRIT: 32 % — AB (ref 36.0–46.0)
HEMOGLOBIN: 10.6 g/dL — AB (ref 12.0–15.0)
MCH: 28.8 pg (ref 26.0–34.0)
MCHC: 33.1 g/dL (ref 30.0–36.0)
MCV: 87 fL (ref 78.0–100.0)
Platelets: 268 10*3/uL (ref 150–400)
RBC: 3.68 MIL/uL — AB (ref 3.87–5.11)
RDW: 16.3 % — ABNORMAL HIGH (ref 11.5–15.5)
WBC: 5.5 10*3/uL (ref 4.0–10.5)

## 2014-11-09 MED ORDER — SENNOSIDES 8.8 MG/5ML PO SYRP
5.0000 mL | ORAL_SOLUTION | Freq: Two times a day (BID) | ORAL | Status: DC
  Administered 2014-11-09 (×2): 5 mL via ORAL
  Filled 2014-11-09 (×4): qty 5

## 2014-11-09 MED ORDER — POLYETHYLENE GLYCOL 3350 17 G PO PACK
17.0000 g | PACK | Freq: Every day | ORAL | Status: DC
  Administered 2014-11-09: 17 g via ORAL
  Filled 2014-11-09 (×2): qty 1

## 2014-11-09 NOTE — Evaluation (Signed)
Physical Therapy Evaluation Patient Details Name: Aylla Huffine MRN: [id number] DOB: [birth year] Today's Date: 11/09/2014   History of Present Illness  81 yo female admitted with SBO. Hx of dementia, osteoporosis, TIA, L hip fx-IM nail  Clinical Impression  On eval, pt was Min guard assist for mobility-able to ambulate ~300 feet with RW-unsure if pt used a walker at facility. Recommend RW (if pt does not already have) and HHPT to follow up and evaluate to ensure safe mobility in home environment/ALF.     Follow Up Recommendations Home health PT (at ALF to ensure safe mobility in environment)    Equipment Recommendations  Rolling walker with 5" wheels (if pt does not have already)    Recommendations for Other Services       Precautions / Restrictions Precautions Precautions: Fall Restrictions Weight Bearing Restrictions: No      Mobility  Bed Mobility Overal bed mobility: Modified Independent                Transfers Overall transfer level: Needs assistance   Transfers: Sit to/from Stand Sit to Stand: Supervision         General transfer comment: supervision for safety  Ambulation/Gait Ambulation/Gait assistance: Min guard Ambulation Distance (Feet): 300 Feet Assistive device: Rolling walker (2 wheeled) Gait Pattern/deviations: Step-through pattern     General Gait Details: close guard for safety. No LOB with use of walker.   Stairs            Wheelchair Mobility    Modified Rankin (Stroke Patients Only)       Balance Overall balance assessment: Needs assistance         Standing balance support: Bilateral upper extremity supported;During functional activity Standing balance-Leahy Scale: Poor Standing balance comment: needs walker                             Pertinent Vitals/Pain Pain Assessment: No/denies pain    Home Living Family/patient expects to be discharged to:: Assisted living               Home Equipment:  Walker - 2 wheels      Prior Function Level of Independence: Independent with assistive device(s)         Comments: pt states she was using a walker.      Hand Dominance        Extremity/Trunk Assessment   Upper Extremity Assessment: Overall WFL for tasks assessed           Lower Extremity Assessment: Overall WFL for tasks assessed      Cervical / Trunk Assessment: Kyphotic  Communication   Communication: No difficulties  Cognition Arousal/Alertness: Awake/alert Behavior During Therapy: WFL for tasks assessed/performed Overall Cognitive Status: History of cognitive impairments - at baseline       Memory: Decreased short-term memory              General Comments      Exercises        Assessment/Plan    PT Assessment Patient needs continued PT services  PT Diagnosis Difficulty walking   PT Problem List Decreased balance;Decreased mobility  PT Treatment Interventions DME instruction;Gait training;Functional mobility training;Therapeutic activities;Therapeutic exercise;Patient/family education;Balance training   PT Goals (Current goals can be found in the Care Plan section) Acute Rehab PT Goals Patient Stated Goal: none stated PT Goal Formulation: With patient Time For Goal Achievement: 11/23/14 Potential to Achieve Goals: Good    Frequency  Min 3X/week   Barriers to discharge        Co-evaluation               End of Session Equipment Utilized During Treatment: Gait belt Activity Tolerance: Patient tolerated treatment well Patient left: in bed;with call bell/phone within reach;with bed alarm set           Time: [id number] PT Time Calculation (min) (ACUTE ONLY): 26 min   Charges:   PT Evaluation $Initial PT Evaluation Tier I: 1 Procedure PT Treatments $Gait Training: 8-22 mins   PT G Codes:        Rebeca AlertJannie Racquel Arkin, MPT Pager: [phone]

## 2014-11-09 NOTE — Progress Notes (Signed)
Patient ID: Ashley Burnett, female   DOB: 06-05-1918, 81 y.o.   MRN: [id number]  [facility] PROGRESS NOTE  Ashley Burnett WUJ:[id number] DOB: August 20, 1918 DOA: 11/07/2014 PCP: Ashley Freud, DO  Brief narrative:    81 y.o. female with diverticulosis, dementia, presented to [facility] emergency department with main concern of several days duration of progressively worsening epigastric abdominal pain, poor oral intake, nausea. Patient is unable to explain why she presented to [facility] and per initial history, granddaughter at bedside was providing most of the details at the time of the admission. Patient denied any chest pain or shortness of breath, no specific urinary concerns, no dizziness or headaches.  In emergency department, patient noted to be hemodynamically stable, vital signs stable, CT scan of the abdomen and pelvis consistent with small bowel obstruction. Burnett was consulted and tried hospitalist asked to admit for further evaluation. Of note patient has history of abdominal surgeries in the past including abdominal hysterectomy, exploratory laparotomy and colon resection.  Assessment/Plan:    Principal Problem:  SBO (small bowel obstruction) - Patient reports feeling better this morning, - Tolerating clear liquid diet well, has not had bowel movement since admission - Will add Senokot - May be able to advance diet to soft this afternoon Active Problems:  Dementia - Appears to be stable at this time, patient alert and oriented to name, place  Depression - Clinically stable  Protein-calorie malnutrition, severe - Considering small bowel obstruction, slowly advancing diet - Still on clear liquids and possibly advance to soft diet this afternoon if patient having bowel movement  Anemia of chronic disease, iron deficiency - No signs of active bleeding  Sigmoid diverticulosis - Noted on CT abdomen and pelvis, patient with known history of diverticulosis - No active  inflammation noted  DVT prophylaxis - Lovenox subcutaneous  Code Status: DNR Family Communication: plan of care discussed with the patient Disposition Plan: Patient is from assisted living facility, will need PT evaluation for further recommendations on appropriate discharge  IV access:  Peripheral IV  Procedures and diagnostic studies:   Ct Abdomen Pelvis W Contrast 11/07/2014 Small bowel obstruction in the lower abdomen and pelvis. The upstream small bowel also is relatively decompressed, but there is no strong evidence of a closed loop obstruction. Previous left upper quadrant small bowel resection and reanastomosis. Sigmoid diverticulosis with no active inflammation.   Dg Abd Acute W/chest 11/07/2014 Normal bowel gas pattern. 2. Emphysema.  Medical Consultants:  Burnett  Other Consultants:  PT evaluation  IAnti-Infectives:   None      Ashley Presto, MD  [facility] Pager [phone]  If 7PM-7AM, please contact night-coverage [email or web address] Password [facility] 11/09/2014, 11:34 AM   LOS: 2 days   HPI/Subjective: No events overnight.   Objective: Filed Vitals:   11/08/14 0550 11/08/14 1327 11/08/14 2338 11/09/14 0535  BP: 159/76 151/68 148/64 164/72  Pulse: 70 64 66 72  Temp: 98.2 F (36.8 C) 97.4 F (36.3 C) 98.1 F (36.7 C) 98.3 F (36.8 C)  TempSrc: Oral Oral Oral Oral  Resp: Height:      Weight: 37 kg (81 lb 9.1 oz)   39 kg (85 lb 15.7 oz)  SpO2: 96% 100% 97% 97%    Intake/Output Summary (Last 24 hours) at 11/09/14 1134 Last data filed at 11/09/14 0744  Gross per 24 hour  Intake 1860.95 ml  Output   3100 ml  Net -1239.05 ml    Exam:   General:  Pt is  alert, follows commands appropriately, not in acute distress  Cardiovascular: Regular rate and rhythm, no rubs, no gallops  Respiratory: Clear to auscultation bilaterally, no wheezing, diminished breath sounds at bases  Abdomen: Soft, non tender, non distended, bowel  sounds present, no guarding  Extremities: No edema, pulses DP and PT palpable bilaterally  Neuro: Grossly nonfocal  Data Reviewed: Basic Metabolic Panel:  Recent Labs Lab 11/07/14 1352 11/07/14 1930 11/08/14 0515 11/09/14 0433  NA 133*  --  135 135  K 5.1  --  3.6 3.6  CL 99  --  102 103  CO2 26  --  25 25  GLUCOSE 116*  --  87 100*  BUN 11  --  9 6  CREATININE 0.59 0.50 0.46* 0.40*  CALCIUM 8.9  --  8.7 8.4   Liver Function Tests:  Recent Labs Lab 11/07/14 1352  AST 39*  ALT 15  ALKPHOS 81  BILITOT 1.3*  PROT 7.1  ALBUMIN 4.0    Recent Labs Lab 11/07/14 1352  LIPASE 31   CBC:  Recent Labs Lab 11/07/14 1930 11/08/14 0515 11/09/14 0433  WBC 7.7 5.6 5.5  HGB 11.0* 11.0* 10.6*  HCT 33.2* 33.6* 32.0*  MCV 86.5 86.6 87.0  PLT 305 314 268    Scheduled Meds: . enoxaparin (LOVENOX) injection  30 mg Subcutaneous Q24H   Continuous Infusions: . sodium chloride 75 mL/hr at 11/09/14 1034

## 2014-11-09 NOTE — Progress Notes (Signed)
Clinical Social Work Department BRIEF PSYCHOSOCIAL ASSESSMENT 11/09/2014  Patient:  Ashley Burnett,Ashley Burnett     Account Number:  [id number]     Admit date:  11/07/2014  Clinical Social Worker:  Garlan FairKIDD,Megen Madewell, LCSWA  Date/Time:  11/09/2014 02:50 PM  Referred by:  Physician  Date Referred:  11/09/2014 Referred for  ALF Placement   Other Referral:   Interview type:  Family Other interview type:    PSYCHOSOCIAL DATA Living Status:  FACILITY Admitted from facility:  [facility] Level of care:  Assisted Living Primary support name:  Amala Demas/daughter/[phone] Primary support relationship to patient:  CHILD, ADULT Degree of support available:   adequate    CURRENT CONCERNS Current Concerns  Post-Acute Placement   Other Concerns:    SOCIAL WORK ASSESSMENT / PLAN CSW received referral that [facility].    CSW received return phone call from pt daughter, Ander SladeJoy. CSW intorduced self and explained role. Pt daughter confirmed that [facility]. Pt daughter expressed satisfaction with pt ALF. CSW discussed with pt daughter that pt ambulated 300 ft with PT. Pt daughter confirmed plan is for [facility] when medically stable for discharge.    CSW completed FL2. CS[facility] and confirmed that facility can accept pt back upon discharge.    CSW to continue to follow to provide suport and assist with pt discharge planning needs.   Assessment/plan status:  Psychosocial Support/Ongoing Assessment of Needs Other assessment/ plan:   discharge planning   Information/referral to community resources:   [facility]    PATIENT'S/FAMILY'S RESPONSE TO PLAN OF CARE: Pt alert and oriented to person only. Pt daughter supportive and actively involved in pt care. Pt daughter feels that ALF has been meeting pt needs and feels comfortable with  plan for pt to [facility] when medically ready for discharge.    Loletta SpecterSuzanna Ladavion Savitz, MSW, LCSW Clinical Social Work [phone]

## 2014-11-09 NOTE — Progress Notes (Signed)
CSW received referral that pt [facility].   CSW reviewed chart and noted that pt has a diagnosis of dementia and oriented to person only.   CSW spoke with physical therapist who completed pt PT evaluation and stated that pt ambulated min guard assist 300 feet and PT feels that pt is appropriate to return to ALF level of care.  CSW visited pt room and no family present at bedside.  CSW contacted pt daughter, Ander SladeJoy via telephone and left voice message. CSW to await return phone call.   CSW to follow up to complete full psychosocial assessment.   Loletta SpecterSuzanna Kidd, MSW, LCSW Clinical Social Work [phone]

## 2014-11-10 LAB — BASIC METABOLIC PANEL
ANION GAP: 8 (ref 5–15)
BUN: <5 mg/dL — ABNORMAL LOW (ref 6–23)
CALCIUM: 9.1 mg/dL (ref 8.4–10.5)
CO2: 24 mmol/L (ref 19–32)
CREATININE: 0.43 mg/dL — AB (ref 0.50–1.10)
Chloride: 104 mmol/L (ref 96–112)
GFR calc non Af Amer: 84 mL/min — ABNORMAL LOW (ref >90)
Glucose, Bld: 99 mg/dL (ref 70–99)
Potassium: 3.9 mmol/L (ref 3.5–5.1)
Sodium: 136 mmol/L (ref 135–145)

## 2014-11-10 LAB — CBC
HCT: 35.7 % — ABNORMAL LOW (ref 36.0–46.0)
Hemoglobin: 12 g/dL (ref 12.0–15.0)
MCH: 29.1 pg (ref 26.0–34.0)
MCHC: 33.6 g/dL (ref 30.0–36.0)
MCV: 86.7 fL (ref 78.0–100.0)
Platelets: 290 10*3/uL (ref 150–400)
RBC: 4.12 MIL/uL (ref 3.87–5.11)
RDW: 16 % — AB (ref 11.5–15.5)
WBC: 6.3 10*3/uL (ref 4.0–10.5)

## 2014-11-10 MED ORDER — ZOLPIDEM TARTRATE 5 MG PO TABS: 5.0000 mg | ORAL_TABLET | Freq: Every evening | ORAL | Status: DC | PRN

## 2014-11-10 MED ORDER — HYDROCODONE-ACETAMINOPHEN 5-325 MG PO TABS: 1.0000 | ORAL_TABLET | Freq: Two times a day (BID) | ORAL | Status: DC | PRN

## 2014-11-10 NOTE — Progress Notes (Addendum)
CSW continuing to follow.   CSW received notification from MD that pt not yet medically ready today, but anticipates d/c tomorrow.   [facility] and spoke with RN, Boneta LucksJenny.   Per ALF RN, Boneta LucksJenny, facility can accept pt back tomorrow, but facility needs preliminary discharge information today for review.   CSW notified MD and MD completed preliminary d/c information.  CSW faxed preliminary discharge summary and FL2 [facility] and notified ALF RN, Boneta LucksJenny. Awaiting ALF RN to review information.  CSW contacted pt daughter, Ander SladeJoy to update. CSW notified pt daughter, Ander SladeJoy that plan is for pt to discharge tomorrow. Pt daughter stated that she is working tomorrow, but can provide transportation for pt between 12-1 pm tomorrow, likely arriving closer to 12. Pt daughter plans to arrive to hospital and call unit in order to notify unit that she is downstairs to transport pt. CSW provided pt daughter phone number to nursing unit. CSW discussed with pt daughter that discharge packet will be provided at pt bedside and to ensure that pt has discharge packet when brought down for transport. Pt daughter expressed understanding.   CSW to continue to follow to provide support and assist with pt discharge [facility].  Addendum 4:23 pm:   CSW continuing to follow.  CSW received notification from [facility] RN, Boneta LucksJenny that pt discharge information for tomorrow was reviewed and pt can return to facility tomorrow.  Per [facility] RN, Boneta LucksJenny, no further information needs to be faxed tomorrow. Pt RN will just need to call report to [phone].   Pt daughter plans to provide transportation for pt back to ALF and will be at hospital around 12 pm to transport pt.   Weekend CSW to assist with facilitating pt discharge needs [facility].   Loletta SpecterSuzanna Kidd, MSW, LCSW Clinical Social Work [phone]

## 2014-11-10 NOTE — Progress Notes (Signed)
Physical Therapy Treatment Patient Details Name: Ashley ReeJoy Ridlon MRN: [id number] DOB: May 28, 1918 Today's Date: 11/10/2014    History of Present Illness 81 yo female admitted with SBO. Hx of dementia, osteoporosis, TIA, L hip fx-IM nail    PT Comments    **Pt is progressing with mobility, she walked 450' with RW and supervision for safety due to dementia. *  Follow Up Recommendations  Home health PT (at ALF to ensure safe mobility in environment)     Equipment Recommendations  Rolling walker with 5" wheels (if pt does not have already)    Recommendations for Other Services       Precautions / Restrictions Precautions Precautions: Fall Restrictions Weight Bearing Restrictions: No    Mobility  Bed Mobility Overal bed mobility: Modified Independent                Transfers Overall transfer level: Needs assistance Equipment used: Rolling walker (2 wheeled)   Sit to Stand: Supervision         General transfer comment: supervision for safety  Ambulation/Gait Ambulation/Gait assistance: Supervision Ambulation Distance (Feet): 450 Feet Assistive device: Rolling walker (2 wheeled) Gait Pattern/deviations: Step-through pattern     General Gait Details: supervision for safety. No LOB with use of walker. cues to lift head.   Stairs            Wheelchair Mobility    Modified Rankin (Stroke Patients Only)       Balance             Standing balance-Leahy Scale: Fair                      Cognition Arousal/Alertness: Awake/alert Behavior During Therapy: WFL for tasks assessed/performed Overall Cognitive Status: History of cognitive impairments - at baseline       Memory: Decreased short-term memory              Exercises      General Comments        Pertinent Vitals/Pain Pain Assessment: No/denies pain    Home Living                      Prior Function            PT Goals (current goals can now be found in  the care plan section) Acute Rehab PT Goals Patient Stated Goal: to go home to her cat PT Goal Formulation: With patient Time For Goal Achievement: 11/23/14 Potential to Achieve Goals: Good Progress towards PT goals: Progressing toward goals    Frequency  Min 3X/week    PT Plan Current plan remains appropriate    Co-evaluation             End of Session Equipment Utilized During Treatment: Gait belt Activity Tolerance: Patient tolerated treatment well Patient left: with call bell/phone within reach;in chair;with chair alarm set     Time: 1323-1350 PT Time Calculation (min) (ACUTE ONLY): 27 min  Charges:  $Gait Training: 23-37 mins                    G Codes:      Tamala SerUhlenberg, Douglas Rooks Kistler 11/10/2014, 1:53 PM [id number]

## 2014-11-10 NOTE — Discharge Instructions (Signed)
Small Bowel Obstruction °A small bowel obstruction is a blockage (obstruction) of the small intestine (small bowel). The small bowel is a long, slender tube that connects the stomach to the colon. Its job is to absorb nutrients from the fluids and foods you consume into the bloodstream.  °CAUSES  °There are many causes of intestinal blockage. The most common ones include: °· Hernias. This is a more common cause in children than adults. °· Inflammatory bowel disease (enteritis and colitis). °· Twisting of the bowel (volvulus). °· Tumors. °· Scar tissue (adhesions) from previous surgery or radiation treatment. °· Recent surgery. This may cause an acute small bowel obstruction called an ileus. °SYMPTOMS  °· Abdominal pain. This may be dull cramps or sharp pain. It may occur in one area or may be present in the entire abdomen. Pain can range from mild to severe, depending on the degree of obstruction. °· Nausea and vomiting. Vomit may be greenish or yellow bile color. °· Distended or swollen stomach. Abdominal bloating is a common symptom. °· Constipation. °· Lack of passing gas. °· Frequent belching. °· Diarrhea. This may occur if runny stool is able to leak around the obstruction. °DIAGNOSIS  °Your caregiver can usually diagnose small bowel obstruction by taking a history, doing a physical exam, and taking X-rays. If the cause is unclear, a CT scan (computerized tomography) of your abdomen and pelvis may be needed. °TREATMENT  °Treatment of the blockage depends on the cause and how bad the problem is.  °· Sometimes, the obstruction improves with bed rest and intravenous (IV) fluids. °· Resting the bowel is very important. This means following a simple diet. Sometimes, a clear liquid diet may be required for several days. °· Sometimes, a small tube (nasogastric tube) is placed into the stomach to decompress the bowel. When the bowel is blocked, it usually swells up like a balloon filled with air and fluids.  Decompression means that the air and fluids are removed by suction through that tube. This can help with pain, discomfort, and nausea. It can also help the obstruction resolve faster. °· Surgery may be required if other treatments do not work. Bowel obstruction from a hernia may require early surgery and can be an emergency procedure. Adhesions that cause frequent or severe obstructions may also require surgery. °HOME CARE INSTRUCTIONS °If your bowel obstruction is only partial or incomplete, you may be allowed to go home. °· Get plenty of rest. °· Follow your diet as directed by your caregiver. °· Only consume clear liquids until your condition improves. °· Avoid solid foods as instructed. °SEEK IMMEDIATE MEDICAL CARE IF: °· You have increased pain or cramping. °· You vomit blood. °· You have uncontrolled vomiting or nausea. °· You cannot drink fluids due to vomiting or pain. °· You develop confusion. °· You begin feeling very dry or thirsty (dehydrated). °· You have severe bloating. °· You have chills. °· You have a fever. °· You feel extremely weak or you faint. °MAKE SURE YOU: °· Understand these instructions. °· Will watch your condition. °· Will get help right away if you are not doing well or get worse. °Document Released: 11/25/2005 Document Revised: 12/01/2011 Document Reviewed: 11/22/2010 °ExitCare® Patient Information ©2015 [employer]. This information is not intended to replace advice given to you by your health care provider. Make sure you discuss any questions you have with your health care provider. ° °

## 2014-11-10 NOTE — Discharge Summary (Signed)
Physician Discharge Summary  Ashley ReeJoy Burnett WUJ:[id number] DOB: June 30, 1918 DOA: 11/07/2014  PCP: [name], DO  Admit date: 11/07/2014 Discharge date: 11/11/2014  Recommendations for Outpatient Follow-up:  1. Pt will need to follow up with PCP in 2-3 weeks post discharge 2. Please obtain BMP to evaluate electrolytes and kidney function 3. Please also check CBC to evaluate Hg and Hct levels  Discharge Diagnoses:  Principal Problem:   SBO (small bowel obstruction) Active Problems:   Diverticulosis of large intestine   Depression   Protein-calorie malnutrition, severe   Dementia  Discharge Condition: Stable  Diet recommendation: Mechanical soft   History of present illness:  81 y.o. female with diverticulosis, dementia, [facility] with main concern of several days duration of progressively worsening epigastric abdominal pain, poor oral intake, nausea. Patient is unable to explain why she [facility] and per initial history, granddaughter at bedside was providing most of the details at the time of the admission. Patient denied any chest pain or shortness of breath, no specific urinary concerns, no dizziness or headaches.  In emergency department, patient noted to be hemodynamically stable, vital signs stable, CT scan of the abdomen and pelvis consistent with small bowel obstruction. Surgery was consulted and tried hospitalist asked to admit for further evaluation. Of note patient has history of abdominal surgeries in the past including abdominal hysterectomy, exploratory laparotomy and colon resection.  Hospital Course:   Principal Problem:  SBO (small bowel obstruction) - Patient reports feeling better this morning, - Tolerating clear liquid diet well, has had bowel movement this AM - advance diet today and if tolerating, can be d/c in AM Active Problems:  Dementia - Appears to be stable at this time, patient alert and oriented to  name, place  Depression - Clinically stable  Protein-calorie malnutrition, severe - Considering small bowel obstruction, slowly advancing diet - advance diet to soft today   Anemia of chronic disease, iron deficiency - No signs of active bleeding  Sigmoid diverticulosis - Noted on CT abdomen and pelvis, patient with known history of diverticulosis - No active inflammation noted  Code Status: DNR Family Communication: plan of care discussed with the patient Disposition Plan: ALF in AM  Procedures/Studies: Ct Abdomen Pelvis W Contrast 11/07/2014 Small bowel obstruction in the lower abdomen and pelvis. The upstream small bowel also is relatively decompressed, but there is no strong evidence of a closed loop obstruction. Previous left upper quadrant small bowel resection and reanastomosis. Sigmoid diverticulosis with no active inflammation.   Dg Abd Acute W/chest 11/07/2014 Normal bowel gas pattern. 2. Emphysema.   Consultations:  Surgery   Antibiotics:  None   Discharge Exam: Filed Vitals:   11/10/14 0448  BP: 138/59  Pulse: 67  Temp: 97.7 F (36.5 C)  Resp: 16   Filed Vitals:   11/09/14 1414 11/09/14 2020 11/09/14 2117 11/10/14 0448  BP: 159/67  148/59 138/59  Pulse: 65 68 62 67  Temp: 97 F (36.1 C)  97.9 F (36.6 C) 97.7 F (36.5 C)  TempSrc: Oral  Oral Oral  Resp: 18  20 16   Height:      Weight:    38.873 kg (85 lb 11.2 oz)  SpO2: 98%  99% 99%    General: Pt is alert, follows commands appropriately, not in acute distress Cardiovascular: Regular rate and rhythm,  no rubs, no gallops Respiratory: Clear to auscultation bilaterally, no wheezing, no crackles, no rhonchi Abdominal: Soft, non tender, non distended, bowel sounds +,  no guarding Extremities: no edema, no cyanosis, pulses palpable bilaterally DP and PT Neuro: Grossly nonfocal  Discharge Instructions  Discharge Instructions    Diet - low sodium heart healthy    Complete by:  As  directed      Increase activity slowly    Complete by:  As directed             Medication List    TAKE these medications        acetaminophen 325 MG tablet  Commonly known as:  TYLENOL  Take 2 tablets (650 mg total) by mouth every 6 (six) hours as needed.     feeding supplement (ENSURE COMPLETE) Liqd  Take 237 mLs by mouth 2 (two) times daily between meals.     FLUoxetine 20 MG capsule  Commonly known as:  PROZAC  Take 1 capsule (20 mg total) by mouth daily.     HYDROcodone-acetaminophen 5-325 MG per tablet  Commonly known as:  NORCO/VICODIN  Take 1 tablet by mouth every 12 (twelve) hours as needed for moderate pain.     QUEtiapine 25 MG tablet  Commonly known as:  SEROQUEL  Take 1 tablet (25 mg total) by mouth at bedtime.     zolpidem 5 MG tablet  Commonly known as:  AMBIEN  Take 1 tablet (5 mg total) by mouth at bedtime as needed for sleep.           Follow-up Information    Follow up with Loreen Freud, DO.   Specialty:  Family Medicine   Contact information:   [address] [place] [address] [phone]        The results of significant diagnostics from this hospitalization (including imaging, microbiology, ancillary and laboratory) are listed below for reference.     Microbiology: No results found for this or any previous visit (from the past 240 hour(s)).   Labs: Basic Metabolic Panel:  Recent Labs Lab 11/07/14 1352 11/07/14 1930 11/08/14 0515 11/09/14 0433 11/10/14 0423  NA 133*  --  135 135 136  K 5.1  --  3.6 3.6 3.9  CL 99  --  102 103 104  CO2 26  --  GLUCOSE 116*  --  87 100* 99  BUN 11  --  9 6 <5*  CREATININE 0.59 0.50 0.46* 0.40* 0.43*  CALCIUM 8.9  --  8.7 8.4 9.1   Liver Function Tests:  Recent Labs Lab 11/07/14 1352  AST 39*  ALT 15  ALKPHOS 81  BILITOT 1.3*  PROT 7.1  ALBUMIN 4.0    Recent Labs Lab 11/07/14 1352  LIPASE 31   CBC:  Recent Labs Lab 11/07/14 1930  11/08/14 0515 11/09/14 0433 11/10/14 0423  WBC 7.7 5.6 5.5 6.3  HGB 11.0* 11.0* 10.6* 12.0  HCT 33.2* 33.6* 32.0* 35.7*  MCV 86.5 86.6 87.0 86.7  PLT 305 314 268 290    SIGNED: Time coordinating discharge: Over 30 minutes  Debbora Presto, MD  [facility] 11/10/2014, 1:49 PM Pager [phone]  If 7PM-7AM, please contact night-coverage [email or web address] Password [email or web address]

## 2014-11-11 NOTE — Progress Notes (Signed)
Pt seen and examined at bedside. Tolerating diet well. No concerns, no chest pain or shortness of breath. Has had BM this AM. Ready for d/c. Please see d/c summary 11/10/2014.  Debbora PrestoMAGICK-Tammala Weider, MD  [facility] Pager [phone]  If 7PM-7AM, please contact night-coverage [email or web address] Password [email or web address]

## 2014-11-11 NOTE — Progress Notes (Signed)
[facility], discharge packet given to daughter who will transport patient to facility.

## 2014-11-11 NOTE — Clinical Social Work Note (Signed)
CSW reviewed chart which reflected discharge summary  CSW prepared discharge packet and provided to RN  Pt''s daughter is picking pt up and transporting her back to [facility]  No further CSW needs  CSW signing off  .Ashley Bubaegina Ashley Melcher, LCSW [facility] Clinical Social Worker - Weekend Coverage cell #: [phone]

## 2014-11-11 NOTE — Progress Notes (Signed)
Patient report called to [name] [facility].

## 2014-11-13 ENCOUNTER — Telehealth: Payer: Self-pay

## 2014-11-13 NOTE — Telephone Encounter (Signed)
Admit date: 11/07/2014 Discharge date: 11/11/2014 (Pt has not been seen by Dr. Laury AxonLowne since 2013)  Reason for admission:  SBO  Left a message for call back.

## 2014-12-15 ENCOUNTER — Inpatient Hospital Stay (HOSPITAL_COMMUNITY)
Admission: EM | Admit: 2014-12-15 | Discharge: 2014-12-19 | DRG: 389 | Disposition: A | Discharge status: Hospice | Payer: Medicare Other | Attending: Internal Medicine | Admitting: Internal Medicine

## 2014-12-15 ENCOUNTER — Encounter (HOSPITAL_COMMUNITY): Payer: Self-pay | Admitting: Emergency Medicine

## 2014-12-15 ENCOUNTER — Emergency Department (HOSPITAL_COMMUNITY): Payer: Medicare Other

## 2014-12-15 DIAGNOSIS — Z66 Do not resuscitate: Secondary | ICD-10-CM | POA: Diagnosis present

## 2014-12-15 DIAGNOSIS — K56609 Unspecified intestinal obstruction, unspecified as to partial versus complete obstruction: Secondary | ICD-10-CM | POA: Diagnosis present

## 2014-12-15 DIAGNOSIS — Z885 Allergy status to narcotic agent status: Secondary | ICD-10-CM | POA: Diagnosis not present

## 2014-12-15 DIAGNOSIS — M810 Age-related osteoporosis without current pathological fracture: Secondary | ICD-10-CM | POA: Diagnosis present

## 2014-12-15 DIAGNOSIS — Z800 Family history of malignant neoplasm of digestive organs: Secondary | ICD-10-CM

## 2014-12-15 DIAGNOSIS — Z7189 Other specified counseling: Secondary | ICD-10-CM | POA: Diagnosis not present

## 2014-12-15 DIAGNOSIS — K5660 Unspecified intestinal obstruction: Principal | ICD-10-CM | POA: Diagnosis present

## 2014-12-15 DIAGNOSIS — K559 Vascular disorder of intestine, unspecified: Secondary | ICD-10-CM | POA: Diagnosis not present

## 2014-12-15 DIAGNOSIS — Z823 Family history of stroke: Secondary | ICD-10-CM

## 2014-12-15 DIAGNOSIS — E780 Pure hypercholesterolemia: Secondary | ICD-10-CM | POA: Diagnosis present

## 2014-12-15 DIAGNOSIS — K5669 Other intestinal obstruction: Secondary | ICD-10-CM | POA: Diagnosis not present

## 2014-12-15 DIAGNOSIS — Z880 Allergy status to penicillin: Secondary | ICD-10-CM | POA: Diagnosis not present

## 2014-12-15 DIAGNOSIS — Z808 Family history of malignant neoplasm of other organs or systems: Secondary | ICD-10-CM | POA: Diagnosis not present

## 2014-12-15 DIAGNOSIS — Z515 Encounter for palliative care: Secondary | ICD-10-CM | POA: Diagnosis not present

## 2014-12-15 DIAGNOSIS — F419 Anxiety disorder, unspecified: Secondary | ICD-10-CM | POA: Diagnosis present

## 2014-12-15 DIAGNOSIS — M1990 Unspecified osteoarthritis, unspecified site: Secondary | ICD-10-CM | POA: Diagnosis present

## 2014-12-15 DIAGNOSIS — K565 Intestinal adhesions [bands] with obstruction (postprocedural) (postinfection): Secondary | ICD-10-CM | POA: Diagnosis present

## 2014-12-15 DIAGNOSIS — Z8673 Personal history of transient ischemic attack (TIA), and cerebral infarction without residual deficits: Secondary | ICD-10-CM | POA: Diagnosis not present

## 2014-12-15 DIAGNOSIS — F0150 Vascular dementia without behavioral disturbance: Secondary | ICD-10-CM | POA: Diagnosis present

## 2014-12-15 DIAGNOSIS — I672 Cerebral atherosclerosis: Secondary | ICD-10-CM | POA: Diagnosis present

## 2014-12-15 DIAGNOSIS — Z79891 Long term (current) use of opiate analgesic: Secondary | ICD-10-CM

## 2014-12-15 DIAGNOSIS — Z87891 Personal history of nicotine dependence: Secondary | ICD-10-CM

## 2014-12-15 DIAGNOSIS — F0390 Unspecified dementia without behavioral disturbance: Secondary | ICD-10-CM | POA: Diagnosis not present

## 2014-12-15 DIAGNOSIS — Z79899 Other long term (current) drug therapy: Secondary | ICD-10-CM | POA: Diagnosis not present

## 2014-12-15 DIAGNOSIS — Z888 Allergy status to other drugs, medicaments and biological substances status: Secondary | ICD-10-CM

## 2014-12-15 DIAGNOSIS — F329 Major depressive disorder, single episode, unspecified: Secondary | ICD-10-CM | POA: Diagnosis present

## 2014-12-15 DIAGNOSIS — R1084 Generalized abdominal pain: Secondary | ICD-10-CM | POA: Diagnosis not present

## 2014-12-15 DIAGNOSIS — E46 Unspecified protein-calorie malnutrition: Secondary | ICD-10-CM | POA: Diagnosis present

## 2014-12-15 LAB — CBC WITH DIFFERENTIAL/PLATELET
BASOS PCT: 0 % (ref 0–1)
Basophils Absolute: 0 10*3/uL (ref 0.0–0.1)
EOS ABS: 0 10*3/uL (ref 0.0–0.7)
Eosinophils Relative: 0 % (ref 0–5)
HCT: 41.2 % (ref 36.0–46.0)
HEMOGLOBIN: 13.8 g/dL (ref 12.0–15.0)
LYMPHS ABS: 0.7 10*3/uL (ref 0.7–4.0)
Lymphocytes Relative: 6 % — ABNORMAL LOW (ref 12–46)
MCH: 29.1 pg (ref 26.0–34.0)
MCHC: 33.5 g/dL (ref 30.0–36.0)
MCV: 86.9 fL (ref 78.0–100.0)
Monocytes Absolute: 0.5 10*3/uL (ref 0.1–1.0)
Monocytes Relative: 4 % (ref 3–12)
NEUTROS PCT: 90 % — AB (ref 43–77)
Neutro Abs: 10.8 10*3/uL — ABNORMAL HIGH (ref 1.7–7.7)
PLATELETS: 394 10*3/uL (ref 150–400)
RBC: 4.74 MIL/uL (ref 3.87–5.11)
RDW: 15 % (ref 11.5–15.5)
WBC: 12 10*3/uL — ABNORMAL HIGH (ref 4.0–10.5)

## 2014-12-15 LAB — COMPREHENSIVE METABOLIC PANEL
ALK PHOS: 92 U/L (ref 39–117)
ALT: 26 U/L (ref 0–35)
AST: 37 U/L (ref 0–37)
Albumin: 4.5 g/dL (ref 3.5–5.2)
Anion gap: 12 (ref 5–15)
BILIRUBIN TOTAL: 0.5 mg/dL (ref 0.3–1.2)
BUN: 12 mg/dL (ref 6–23)
CHLORIDE: 96 mmol/L (ref 96–112)
CO2: 24 mmol/L (ref 19–32)
Calcium: 9.4 mg/dL (ref 8.4–10.5)
Creatinine, Ser: 0.63 mg/dL (ref 0.50–1.10)
GFR calc non Af Amer: 74 mL/min — ABNORMAL LOW (ref >90)
GFR, EST AFRICAN AMERICAN: 86 mL/min — AB (ref >90)
Glucose, Bld: 187 mg/dL — ABNORMAL HIGH (ref 70–99)
POTASSIUM: 3.8 mmol/L (ref 3.5–5.1)
SODIUM: 132 mmol/L — AB (ref 135–145)
Total Protein: 8.1 g/dL (ref 6.0–8.3)

## 2014-12-15 LAB — URINALYSIS, ROUTINE W REFLEX MICROSCOPIC
Bilirubin Urine: NEGATIVE
Glucose, UA: NEGATIVE mg/dL
KETONES UR: 40 mg/dL — AB
LEUKOCYTES UA: NEGATIVE
NITRITE: NEGATIVE
PROTEIN: 30 mg/dL — AB
Specific Gravity, Urine: 1.017 (ref 1.005–1.030)
UROBILINOGEN UA: 0.2 mg/dL (ref 0.0–1.0)
pH: 6 (ref 5.0–8.0)

## 2014-12-15 LAB — LIPASE, BLOOD: Lipase: 19 U/L (ref 11–59)

## 2014-12-15 LAB — I-STAT TROPONIN, ED: Troponin i, poc: 0 ng/mL (ref 0.00–0.08)

## 2014-12-15 LAB — URINE MICROSCOPIC-ADD ON

## 2014-12-15 LAB — I-STAT CG4 LACTIC ACID, ED: Lactic Acid, Venous: 1.36 mmol/L (ref 0.5–2.0)

## 2014-12-15 MED ORDER — CHLORHEXIDINE GLUCONATE 0.12 % MT SOLN
15.0000 mL | Freq: Two times a day (BID) | OROMUCOSAL | Status: DC
  Administered 2014-12-16 – 2014-12-19 (×6): 15 mL via OROMUCOSAL
  Filled 2014-12-15 (×10): qty 15

## 2014-12-15 MED ORDER — CETYLPYRIDINIUM CHLORIDE 0.05 % MT LIQD
7.0000 mL | Freq: Two times a day (BID) | OROMUCOSAL | Status: DC
  Administered 2014-12-16 – 2014-12-18 (×6): 7 mL via OROMUCOSAL

## 2014-12-15 MED ORDER — FLUOXETINE HCL 20 MG PO CAPS
20.0000 mg | ORAL_CAPSULE | Freq: Every day | ORAL | Status: DC
  Filled 2014-12-15 (×4): qty 1

## 2014-12-15 MED ORDER — SODIUM CHLORIDE 0.9 % IV SOLN
INTRAVENOUS | Status: DC
  Administered 2014-12-15 – 2014-12-17 (×5): via INTRAVENOUS

## 2014-12-15 MED ORDER — ONDANSETRON HCL 4 MG/2ML IJ SOLN
4.0000 mg | Freq: Once | INTRAMUSCULAR | Status: AC
  Administered 2014-12-15: 4 mg via INTRAVENOUS
  Filled 2014-12-15: qty 2

## 2014-12-15 MED ORDER — IOHEXOL 300 MG/ML  SOLN
100.0000 mL | Freq: Once | INTRAMUSCULAR | Status: AC | PRN
  Administered 2014-12-15: 100 mL via INTRAVENOUS

## 2014-12-15 MED ORDER — HYDROMORPHONE HCL 1 MG/ML IJ SOLN
0.5000 mg | INTRAMUSCULAR | Status: DC | PRN
  Administered 2014-12-15 – 2014-12-17 (×8): 0.5 mg via INTRAVENOUS
  Filled 2014-12-15 (×9): qty 1

## 2014-12-15 MED ORDER — HEPARIN SODIUM (PORCINE) 5000 UNIT/ML IJ SOLN
5000.0000 [IU] | Freq: Three times a day (TID) | INTRAMUSCULAR | Status: DC
  Administered 2014-12-16 – 2014-12-18 (×7): 5000 [IU] via SUBCUTANEOUS
  Filled 2014-12-15 (×11): qty 1

## 2014-12-15 MED ORDER — HYDROMORPHONE HCL 1 MG/ML IJ SOLN
0.5000 mg | Freq: Once | INTRAMUSCULAR | Status: AC
  Administered 2014-12-15: 0.5 mg via INTRAVENOUS
  Filled 2014-12-15: qty 1

## 2014-12-15 MED ORDER — QUETIAPINE FUMARATE 25 MG PO TABS
25.0000 mg | ORAL_TABLET | Freq: Every day | ORAL | Status: DC
  Administered 2014-12-16 – 2014-12-17 (×2): 25 mg via ORAL
  Filled 2014-12-15 (×4): qty 1

## 2014-12-15 MED ORDER — KETOCONAZOLE 2 % EX CREA
1.0000 "application " | TOPICAL_CREAM | Freq: Two times a day (BID) | CUTANEOUS | Status: DC
  Administered 2014-12-18 – 2014-12-19 (×3): 1 via TOPICAL
  Filled 2014-12-15: qty 15

## 2014-12-15 MED ORDER — ONDANSETRON HCL 4 MG/2ML IJ SOLN
4.0000 mg | Freq: Four times a day (QID) | INTRAMUSCULAR | Status: DC | PRN
  Administered 2014-12-15 – 2014-12-17 (×5): 4 mg via INTRAVENOUS
  Filled 2014-12-15 (×6): qty 2

## 2014-12-15 MED ORDER — HYDROCODONE-ACETAMINOPHEN 5-325 MG PO TABS: 1.0000 | ORAL_TABLET | Freq: Two times a day (BID) | ORAL | Status: DC | PRN

## 2014-12-15 MED ORDER — CLINDAMYCIN PHOSPHATE 600 MG/50ML IV SOLN: 600.0000 mg | Freq: Three times a day (TID) | INTRAVENOUS | Status: DC

## 2014-12-15 MED ORDER — ACETAMINOPHEN 325 MG PO TABS: 650.0000 mg | ORAL_TABLET | Freq: Four times a day (QID) | ORAL | Status: DC | PRN

## 2014-12-15 NOTE — Progress Notes (Signed)
CSW met with pt at bedside. There was no family present. Patient states that she does not remember why she presents to [facility]. Per note, patient presents to [facility] due to c/o abdominal pain after prunes and prune juice.  Patient informed CSW that her waist, hip, and back hurts.  Patient appears to be forgetful. Patient also informed CSW that she was unsure if she came from [place]. CSW checked pt chart which confirms that the pt is from [place]. Per chart, pt has a history of dementia.   Patient states that she does receives assistance with completing ADL's. However, she states that she can complete her ADL's independently sometimes. Patient is not sure if she has fallen within the past 6 months.  Patient informed CSW that her support sytem includes her daughter named Krishna.  Patient did not state any concerns and did not have any questions.  Willette Brace [phone] ED CSW 12/15/2014 6:24 PM

## 2014-12-15 NOTE — ED Notes (Signed)
Patient transported to X-ray 

## 2014-12-15 NOTE — H&P (Signed)
[facility] History and Physical  Ivori Storr XBJ:[id number] DOB: Sep 17, 1918 DOA: 12/15/2014  Referring physician: EDP PCP: Loreen Freud, DO   Chief Complaint: Abdominal pain   HPI: Margareta Laureano is a 81 y.o. female brought in from [facility] with 1 day history of abdominal pain.  Pain is generalized, sharp, no radiation, single episode of vomiting today per report.  No constipation, no chills, no diarrhea, no dysuria, no fever.  This occurs in the context of recent admission 1 month ago for SBO which was treated medically.  SBO likely due to adhesions from multiple abdominal surgeries in the past.  Review of Systems: Systems reviewed.  As above, otherwise negative  Past Medical History  Diagnosis Date  . Diverticulosis 2001    universal,severe with recurrent diverticulitis.   . Family history of GERD   . Osteoporosis   . Transient ischemic attack   . Fracture     leg wrist  . High cholesterol   . Depression   . Gastritis 2008    seen on EGD  . Arthritis   . Dementia   . Lower GI bleed 03/2014    likely diverticular bleed.   . Anemia due to blood loss 03/2014   Past Surgical History  Procedure Laterality Date  . Abdominal hysterectomy    . Laparotomy  1960    SBO  . Ovarian cyst removal    . Tonsillectomy    . Appendectomy    . Abdominal adhesion surgery  1993    SBO   . Laparotomy N/A 02/09/2013    Procedure: EXPLORATORY LAPAROTOMY;  Surgeon: Emelia Loron, MD;  Location: [facility] OR;  Service: General;  Laterality: N/A;  . Colon resection N/A 02/09/2013    Procedure: COLON RESECTION;  Surgeon: Emelia Loron, MD;  Location: [facility] OR;  Service: General;  Laterality: N/A;  . Intramedullary (im) nail intertrochanteric Left 01/08/2014    Procedure: INTRAMEDULLARY (IM) NAIL INTERTROCHANTRIC;  Surgeon: Nadara Mustard, MD;  Location: [facility] OR;  Service: Orthopedics;  Laterality: Left;   Social History:  reports that she has quit smoking. She has quit using smokeless tobacco. She reports  that she does not drink alcohol or use illicit drugs.  Allergies  Allergen Reactions  . Amoxicillin     On MAR  . Codeine Other (See Comments)    unknown  . Indomethacin Other (See Comments)    Unknown. Spoke to granddaughter on 02/05/13 who stated she was not aware of this allergy and that her grandmother (Ms. Bartle) does not remember this allergy either.   . Morphine And Related Other (See Comments)    unknown    Family History  Problem Relation Age of Onset  . Stroke    . Colon cancer    . Heart disease      aunt  . Melanoma Mother   . Cancer Mother     melenoma  . Tuberculosis Father      Prior to Admission medications   Medication Sig Start Date End Date Taking? Authorizing Provider  feeding supplement (ENSURE COMPLETE) LIQD Take 237 mLs by mouth 2 (two) times daily between meals. 02/17/13  Yes Elizabeth A White, PA-C  FLUoxetine (PROZAC) 20 MG capsule Take 1 capsule (20 mg total) by mouth daily. 02/17/13  Yes Elizabeth A White, PA-C  HYDROcodone-acetaminophen (NORCO/VICODIN) 5-325 MG per tablet Take 1 tablet by mouth every 12 (twelve) hours as needed for moderate pain. 11/10/14  Yes Dorothea Ogle, MD  ketoconazole (NIZORAL) 2 % cream Apply  1 application topically 2 (two) times daily.   Yes Historical Provider, MD  QUEtiapine (SEROQUEL) 25 MG tablet Take 1 tablet (25 mg total) by mouth at bedtime. 04/22/14  Yes Calvert Cantor, MD  zolpidem (AMBIEN) 5 MG tablet Take 1 tablet (5 mg total) by mouth at bedtime as needed for sleep. 11/10/14  Yes Dorothea Ogle, MD  acetaminophen (TYLENOL) 325 MG tablet Take 2 tablets (650 mg total) by mouth every 6 (six) hours as needed. 02/17/13   Landis Martins, PA-C   Physical Exam: Ceasar Mons Vitals:   12/15/14 1836  BP: 176/79  Pulse: 86  Temp:   Resp: 16    BP 176/79 mmHg  Pulse 86  Temp(Src) 97.9 F (36.6 C) (Oral)  Resp 16  SpO2 97%  General Appearance:    Alert, confused with dementia, no distress, appears stated age  Head:     Normocephalic, atraumatic  Eyes:    PERRL, EOMI, sclera non-icteric        Nose:   Nares without drainage or epistaxis. Mucosa, turbinates normal  Throat:   Moist mucous membranes. Oropharynx without erythema or exudate.  Neck:   Supple. No carotid bruits.  No thyromegaly.  No lymphadenopathy.   Back:     No CVA tenderness, no spinal tenderness  Lungs:     Clear to auscultation bilaterally, without wheezes, rhonchi or rales  Chest wall:    No tenderness to palpitation  Heart:    Regular rate and rhythm without murmurs, gallops, rubs  Abdomen:     Soft, non-tender, nondistended, normal bowel sounds, no organomegaly  Genitalia:    deferred  Rectal:    deferred  Extremities:   No clubbing, cyanosis or edema.  Pulses:   2+ and symmetric all extremities  Skin:   Skin color, texture, turgor normal, no rashes or lesions  Lymph nodes:   Cervical, supraclavicular, and axillary nodes normal  Neurologic:   CNII-XII intact. Normal strength, sensation and reflexes      throughout    Labs on Admission:  Basic Metabolic Panel:  Recent Labs Lab 12/15/14 1733  NA 132*  K 3.8  CL 96  CO2 24  GLUCOSE 187*  BUN 12  CREATININE 0.63  CALCIUM 9.4   Liver Function Tests:  Recent Labs Lab 12/15/14 1733  AST 37  ALT 26  ALKPHOS 92  BILITOT 0.5  PROT 8.1  ALBUMIN 4.5    Recent Labs Lab 12/15/14 1733  LIPASE 19   No results for input(s): AMMONIA in the last 168 hours. CBC:  Recent Labs Lab 12/15/14 1733  WBC 12.0*  NEUTROABS 10.8*  HGB 13.8  HCT 41.2  MCV 86.9  PLT 394   Cardiac Enzymes: No results for input(s): CKTOTAL, CKMB, CKMBINDEX, TROPONINI in the last 168 hours.  BNP (last 3 results) No results for input(s): PROBNP in the last 8760 hours. CBG: No results for input(s): GLUCAP in the last 168 hours.  Radiological Exams on Admission: Ct Abdomen Pelvis W Contrast  12/15/2014   CLINICAL DATA:  Diffuse abdominal pain, nausea and vomiting. Colon resection 2014.  History of diverticulitis.  EXAM: CT ABDOMEN AND PELVIS WITH CONTRAST  TECHNIQUE: Multidetector CT imaging of the abdomen and pelvis was performed using the standard protocol following bolus administration of intravenous contrast.  CONTRAST:  OMNIPAQUE IOHEXOL 300 MG/ML  SOLN  COMPARISON:  11/07/2014  FINDINGS: Lower chest: Curvilinear bibasilar scarring or atelectasis reidentified.  Hepatobiliary: Liver and gallbladder appear normal.  Pancreas: Normal, with  pancreatic ductal diameter 2-3 mm at upper limits of normal but no other abnormality identified.  Spleen: Normal  Adrenals/Urinary Tract: Adrenal glands and kidneys appear normal with the exception of a stable subjectively fat density right lower renal pole 4 mm too small to characterize lesion image 26 which could represent focal scarring, angiomyolipoma, or possibly a small cyst. No hydroureteronephrosis. No radiopaque renal or ureteral calculus.  Stomach/Bowel: Multiple abnormal appearing dilated loops of small bowel are identified involving predominantly the mid to distal small bowel within the mid abdomen/pelvis, with swirling of the mesentery image 49 suggesting twist on the vascular pedicle with closed loop obstruction morphology or a rent in the mesentery with similar physiology. The terminal ileum is decompressed, as is the colon. Colonic diverticuli noted without evidence for diverticulitis. Appendix is surgically absent. No bowel wall thickening is identified.  Vascular/Lymphatic: No lymphadenopathy. Extensive atherosclerotic aortic calcification with tortuosity noted but no aneurysm identified.  Reproductive: Uterus presumed surgically absent. Neither ovary is seen but no adnexal mass is visualized.  Other: Small ascites is present diffusely.  No free air.  Musculoskeletal: Extensive lumbar spine disc degenerative change noted. Dynamic left femoral nail is noted. No compression deformity allowing for the presence of osteopenia subjectively.   IMPRESSION: Dilated mid to distal small bowel loops with swirling of the mesentery and terminal ileal decompression suggesting closed loop obstruction which may be due to internal hernia or abnormal mobility. These results were called by telephone at the time of interpretation on 12/15/2014 at 7:48 pm to Dr. Mirian MoMATTHEW GENTRY , who verbally acknowledged these results.   Electronically Signed   By: Christiana PellantGretchen  Green M.D.   On: 12/15/2014 19:49   Dg Abd Acute W/chest  12/15/2014   CLINICAL DATA:  Epigastric abdominal pain, vomiting  EXAM: ACUTE ABDOMEN SERIES (ABDOMEN 2 VIEW & CHEST 1 VIEW)  COMPARISON:  None.  FINDINGS: There is no evidence of dilated bowel loops or free intraperitoneal air. No radiopaque calculi or other significant radiographic abnormality is seen. Heart size and mediastinal contours are within normal limits. Both lungs are clear. Dynamic left femoral nail partly visualized. Atheromatous aortic calcification and tortuosity noted.  IMPRESSION: Negative abdominal radiographs.  No acute cardiopulmonary disease.   Electronically Signed   By: Christiana PellantGretchen  Green M.D.   On: 12/15/2014 18:44    EKG: Independently reviewed.  Assessment/Plan Principal Problem:   Small bowel obstruction Active Problems:   Dementia   1. SBO - 1. Npo except meds 2. Surgery consult 3. IVF to prevent dehydration 2. Dementia - chronic and stable, continue home meds    Code Status: DNR  Family Communication: Left message at contact numbers for daughter, no family in room Disposition Plan: Admit to inpatient   Time spent: 70 min  GARDNER, JARED M. [facility] Pager [phone]  If 7AM-7PM, please contact the day team taking care of the patient [email or web address] Password [email or web address] 12/15/2014, 8:54 PM

## 2014-12-15 NOTE — ED Notes (Signed)
MD at bedside. Dr. Littie DeedsGentry at bedside.

## 2014-12-15 NOTE — ED Provider Notes (Signed)
CSN: [id number]     Arrival date & time 12/15/14  1627 History   First MD Initiated Contact with Patient 12/15/14 1659     Chief Complaint  Patient presents with  . Abdominal Pain     (Consider location/radiation/quality/duration/timing/severity/associated sxs/prior Treatment) Patient is a 81 y.o. female presenting with abdominal pain.  Abdominal Pain Pain location:  Generalized Pain quality: sharp   Pain radiates to:  Does not radiate Pain severity:  Moderate Onset quality:  Gradual Duration:  1 day Timing:  Constant Progression:  Worsening Chronicity:  Recurrent Context comment:  Admission 1 month ago for SBO Relieved by:  Nothing Worsened by:  Palpation, movement and eating Ineffective treatments:  None tried Associated symptoms: anorexia, nausea and vomiting (x1 today)   Associated symptoms: no chills, no constipation, no diarrhea, no dysuria and no fever     Past Medical History  Diagnosis Date  . Diverticulosis 2001    universal,severe with recurrent diverticulitis.   . Family history of GERD   . Osteoporosis   . Transient ischemic attack   . Fracture     leg wrist  . High cholesterol   . Depression   . Gastritis 2008    seen on EGD  . Arthritis   . Dementia   . Lower GI bleed 03/2014    likely diverticular bleed.   . Anemia due to blood loss 03/2014   Past Surgical History  Procedure Laterality Date  . Abdominal hysterectomy    . Laparotomy  1960    SBO  . Ovarian cyst removal    . Tonsillectomy    . Appendectomy    . Abdominal adhesion surgery  1993    SBO   . Laparotomy N/A 02/09/2013    Procedure: EXPLORATORY LAPAROTOMY;  Surgeon: Emelia LoronMatthew Wakefield, MD;  [facility];  Service: General;  Laterality: N/A;  . Colon resection N/A 02/09/2013    Procedure: COLON RESECTION;  Surgeon: Emelia LoronMatthew Wakefield, MD;  [facility];  Service: General;  Laterality: N/A;  . Intramedullary (im) nail intertrochanteric Left 01/08/2014    Procedure: INTRAMEDULLARY  (IM) NAIL INTERTROCHANTRIC;  Surgeon: Nadara MustardMarcus V Duda, MD;  Location: [facility] OR;  Service: Orthopedics;  Laterality: Left;   Family History  Problem Relation Age of Onset  . Stroke    . Colon cancer    . Heart disease      aunt  . Melanoma Mother   . Cancer Mother     melenoma  . Tuberculosis Father    History  Substance Use Topics  . Smoking status: Former Games developermoker  . Smokeless tobacco: Former NeurosurgeonUser  . Alcohol Use: No   OB History    No data available     Review of Systems  Constitutional: Negative for fever and chills.  Gastrointestinal: Positive for nausea, vomiting (x1 today), abdominal pain and anorexia. Negative for diarrhea and constipation.  Genitourinary: Negative for dysuria.  All other systems reviewed and are negative.     Allergies  Amoxicillin; Codeine; Indomethacin; and Morphine and related  Home Medications   Prior to Admission medications   Medication Sig Start Date End Date Taking? Authorizing Provider  feeding supplement (ENSURE COMPLETE) LIQD Take 237 mLs by mouth 2 (two) times daily between meals. 02/17/13  Yes Elizabeth A White, PA-C  FLUoxetine (PROZAC) 20 MG capsule Take 1 capsule (20 mg total) by mouth daily. 02/17/13  Yes Elizabeth A White, PA-C  HYDROcodone-acetaminophen (NORCO/VICODIN) 5-325 MG per tablet Take 1 tablet by mouth every 12 (  twelve) hours as needed for moderate pain. 11/10/14  Yes Dorothea Ogle, MD  ketoconazole (NIZORAL) 2 % cream Apply 1 application topically 2 (two) times daily.   Yes Historical Provider, MD  QUEtiapine (SEROQUEL) 25 MG tablet Take 1 tablet (25 mg total) by mouth at bedtime. 04/22/14  Yes Calvert Cantor, MD  zolpidem (AMBIEN) 5 MG tablet Take 1 tablet (5 mg total) by mouth at bedtime as needed for sleep. 11/10/14  Yes Dorothea Ogle, MD  acetaminophen (TYLENOL) 325 MG tablet Take 2 tablets (650 mg total) by mouth every 6 (six) hours as needed. 02/17/13   Elizabeth A White, PA-C   BP 165/81 mmHg  Pulse 80  Temp(Src) 97.6 F  (36.4 C) (Oral)  Resp 18  Ht  (1.6 m)  Wt 83 lb 14.4 oz (38.057 kg)  BMI 14.87 kg/m2  SpO2 100% Physical Exam  Constitutional: She is oriented to person, place, and time. She appears well-developed and well-nourished.  HENT:  Head: Normocephalic and atraumatic.  Right Ear: External ear normal.  Left Ear: External ear normal.  Eyes: Conjunctivae and EOM are normal. Pupils are equal, round, and reactive to light.  Neck: Normal range of motion. Neck supple.  Cardiovascular: Normal rate, regular rhythm, normal heart sounds and intact distal pulses.   Pulmonary/Chest: Effort normal and breath sounds normal.  Abdominal: Soft. Bowel sounds are normal. There is generalized tenderness.  Musculoskeletal: Normal range of motion.  Neurological: She is alert and oriented to person, place, and time.  Skin: Skin is warm and dry.  Vitals reviewed.   ED Course  Procedures (including critical care time) Labs Review Labs Reviewed  CBC WITH DIFFERENTIAL/PLATELET - Abnormal; Notable for the following:    WBC 12.0 (*)    Neutrophils Relative % 90 (*)    Neutro Abs 10.8 (*)    Lymphocytes Relative 6 (*)    All other components within normal limits  COMPREHENSIVE METABOLIC PANEL - Abnormal; Notable for the following:    Sodium 132 (*)    Glucose, Bld 187 (*)    GFR calc non Af Amer 74 (*)    GFR calc Af Amer 86 (*)    All other components within normal limits  URINALYSIS, ROUTINE W REFLEX MICROSCOPIC - Abnormal; Notable for the following:    Hgb urine dipstick MODERATE (*)    Ketones, ur 40 (*)    Protein, ur 30 (*)    All other components within normal limits  CBC - Abnormal; Notable for the following:    WBC 14.6 (*)    All other components within normal limits  BASIC METABOLIC PANEL - Abnormal; Notable for the following:    Sodium 132 (*)    Glucose, Bld 161 (*)    GFR calc non Af Amer 75 (*)    GFR calc Af Amer 87 (*)    All other components within normal limits  LIPASE, BLOOD   URINE MICROSCOPIC-ADD ON  I-STAT TROPOININ, ED  I-STAT CG4 LACTIC ACID, ED    Imaging Review Ct Abdomen Pelvis W Contrast  12/15/2014   CLINICAL DATA:  Diffuse abdominal pain, nausea and vomiting. Colon resection 2014. History of diverticulitis.  EXAM: CT ABDOMEN AND PELVIS WITH CONTRAST  TECHNIQUE: Multidetector CT imaging of the abdomen and pelvis was performed using the standard protocol following bolus administration of intravenous contrast.  CONTRAST:  OMNIPAQUE IOHEXOL 300 MG/ML  SOLN  COMPARISON:  11/07/2014  FINDINGS: Lower chest: Curvilinear bibasilar scarring or atelectasis reidentified.  Hepatobiliary: Liver and gallbladder appear normal.  Pancreas: Normal, with pancreatic ductal diameter 2-3 mm at upper limits of normal but no other abnormality identified.  Spleen: Normal  Adrenals/Urinary Tract: Adrenal glands and kidneys appear normal with the exception of a stable subjectively fat density right lower renal pole 4 mm too small to characterize lesion image 26 which could represent focal scarring, angiomyolipoma, or possibly a small cyst. No hydroureteronephrosis. No radiopaque renal or ureteral calculus.  Stomach/Bowel: Multiple abnormal appearing dilated loops of small bowel are identified involving predominantly the mid to distal small bowel within the mid abdomen/pelvis, with swirling of the mesentery image 49 suggesting twist on the vascular pedicle with closed loop obstruction morphology or a rent in the mesentery with similar physiology. The terminal ileum is decompressed, as is the colon. Colonic diverticuli noted without evidence for diverticulitis. Appendix is surgically absent. No bowel wall thickening is identified.  Vascular/Lymphatic: No lymphadenopathy. Extensive atherosclerotic aortic calcification with tortuosity noted but no aneurysm identified.  Reproductive: Uterus presumed surgically absent. Neither ovary is seen but no adnexal mass is visualized.  Other: Small ascites  is present diffusely.  No free air.  Musculoskeletal: Extensive lumbar spine disc degenerative change noted. Dynamic left femoral nail is noted. No compression deformity allowing for the presence of osteopenia subjectively.  IMPRESSION: Dilated mid to distal small bowel loops with swirling of the mesentery and terminal ileal decompression suggesting closed loop obstruction which may be due to internal hernia or abnormal mobility. These results were called by telephone at the time of interpretation on 12/15/2014 at 7:48 pm to Dr. Mirian Mo , who verbally acknowledged these results.   Electronically Signed   By: Christiana Pellant M.D.   On: 12/15/2014 19:49   Dg Abd Acute W/chest  12/15/2014   CLINICAL DATA:  Epigastric abdominal pain, vomiting  EXAM: ACUTE ABDOMEN SERIES (ABDOMEN 2 VIEW & CHEST 1 VIEW)  COMPARISON:  None.  FINDINGS: There is no evidence of dilated bowel loops or free intraperitoneal air. No radiopaque calculi or other significant radiographic abnormality is seen. Heart size and mediastinal contours are within normal limits. Both lungs are clear. Dynamic left femoral nail partly visualized. Atheromatous aortic calcification and tortuosity noted.  IMPRESSION: Negative abdominal radiographs.  No acute cardiopulmonary disease.   Electronically Signed   By: Christiana Pellant M.D.   On: 12/15/2014 18:44   Dg Abd Portable 1v  12/16/2014   CLINICAL DATA:  Acute onset of generalized abdominal pain. Follow-up small bowel obstruction. Subsequent encounter.  EXAM: PORTABLE ABDOMEN - 1 VIEW  COMPARISON:  Abdominal radiograph and CT of the abdomen and pelvis performed 12/15/2014  FINDINGS: There is dilatation of small bowel loops up to 3.6 cm in diameter, somewhat more prominent than on the recent prior study. The recent prior CT demonstrated signs suggestive of closed loop obstruction.  Scattered air is noted within the colon, relatively stable from the prior study. Bowel suture lines are seen at the left  mid abdomen. Contrast is seen partially filling the bladder. No free intra-abdominal air is seen, though evaluation for free air is limited on a single supine view.  No acute osseous abnormalities are seen. The patient's left hip hardware is grossly unremarkable, though incompletely imaged. Mild degenerative change is noted along the lower lumbar spine. Scattered vascular calcifications are seen.  IMPRESSION: 1. Mildly worsened dilatation of small-bowel loops, to 3.6 cm in maximal diameter. The recent prior CT demonstrated signs suggestive of closed loop obstruction. Would correlate clinically. No free  intra-abdominal air seen. 2. Scattered vascular calcifications noted.   Electronically Signed   By: Roanna Raider M.D.   On: 12/16/2014 09:06     EKG Interpretation None      MDM   Final diagnoses:  SBO (small bowel obstruction)    81 y.o. female with pertinent PMH of recent SBO, divertiulosis, dementia presents with recurrent abd pain.  Physical exam as above.  Generalized abd tenderness present, unlikely peritonitic, however limited exam secondary to dementia.  CT scan with likely closed loop obstruction.   In recent admissions, pt's POA has refused surgery.  I spoke with her again tonight at the number below and she states she would not pursue active surgical intervention at this time, I specifically mentioned that the pt could die as a result of current condition, but she deferred at this time, would like to pursue medical management.  Admitted in stable condition  Daughter: Lynn Ito [phone]  I have reviewed all laboratory and imaging studies if ordered as above  1. SBO (small bowel obstruction)         Mirian Mo, MD 12/16/14 [phone]

## 2014-12-15 NOTE — ED Notes (Signed)
Bed: [facility] Expected date:  Expected time:  Means of arrival:  Comments: EMS-abdominal pain 

## 2014-12-15 NOTE — ED Notes (Signed)
[facility]IB EMS[facility] Pt c/o abdominal pain after prunes and prune juice. States pain is epigastric. EMS states pain had one episode of vomiting after eating today. Pt has hx of mild dementia. Pt is DNR. Pt alert to baseline.

## 2014-12-16 ENCOUNTER — Inpatient Hospital Stay (HOSPITAL_COMMUNITY): Payer: Medicare Other

## 2014-12-16 DIAGNOSIS — Z7189 Other specified counseling: Secondary | ICD-10-CM

## 2014-12-16 LAB — CBC
HCT: 42.1 % (ref 36.0–46.0)
Hemoglobin: 14.2 g/dL (ref 12.0–15.0)
MCH: 29.3 pg (ref 26.0–34.0)
MCHC: 33.7 g/dL (ref 30.0–36.0)
MCV: 87 fL (ref 78.0–100.0)
Platelets: 387 10*3/uL (ref 150–400)
RBC: 4.84 MIL/uL (ref 3.87–5.11)
RDW: 15.1 % (ref 11.5–15.5)
WBC: 14.6 10*3/uL — AB (ref 4.0–10.5)

## 2014-12-16 LAB — BASIC METABOLIC PANEL
Anion gap: 11 (ref 5–15)
BUN: 14 mg/dL (ref 6–23)
CHLORIDE: 98 mmol/L (ref 96–112)
CO2: 23 mmol/L (ref 19–32)
CREATININE: 0.62 mg/dL (ref 0.50–1.10)
Calcium: 9 mg/dL (ref 8.4–10.5)
GFR calc Af Amer: 87 mL/min — ABNORMAL LOW (ref >90)
GFR, EST NON AFRICAN AMERICAN: 75 mL/min — AB (ref >90)
Glucose, Bld: 161 mg/dL — ABNORMAL HIGH (ref 70–99)
Potassium: 4 mmol/L (ref 3.5–5.1)
Sodium: 132 mmol/L — ABNORMAL LOW (ref 135–145)

## 2014-12-16 MED ORDER — LIP MEDEX EX OINT
TOPICAL_OINTMENT | CUTANEOUS | Status: AC
  Administered 2014-12-16: 12:00:00
  Filled 2014-12-16: qty 7

## 2014-12-16 NOTE — Progress Notes (Addendum)
PROGRESS NOTE  Esabella Stockinger SVX:[id number] DOB: [birth year] DOA: 12/15/2014 PCP: Garnet Koyanagi, DO  HPI: Mackenze Grandison is a 81 y.o. female brought in from [facility] with 1 day history of abdominal pain. Pain is generalized, sharp, no radiation, single episode of vomiting today per report. No constipation, no chills, no diarrhea, no dysuria, no fever. This occurs in the context of recent admission 1 month ago for SBO which was treated medically. SBO likely due to adhesions from multiple abdominal surgeries in the past  Subjective / 24 H Interval events - patient without specific complaints, states that she "feels bad". Underlying dementia, she does not know where she is or why  Assessment/Plan: Principal Problem:   Small bowel obstruction Active Problems:   Dementia  SBO - CT with possible closed loop obstruction - family would not like to pursue surgery, this was discussed last month as well however at that time her SBO resolved on its own - abdominal film this morning shows worsening features - plan for family meeting around 12-1 to discuss goals of care  Advanced dementia - progressively worsening per family, less and less ambulatory   History of diverticulosis - CT scan without evidence of diverticulitis   Depression  Protein calorie malnutrition  Goals of care - met today with patient's daughter and granddaughter, they do not feel like surgery would be a good option given advanced dementia and would unlikely to significantly improve patient's quality of life which is quite poor currently. Per family wishes, will treat symptomatically, closely monitor for now, they hope for improvement with conservative management. If conservative management fails, will shift care towards comfort. Will likely need hospice.   Diet:   Fluids: NS DVT Prophylaxis: heparin  Code Status: DNR Family Communication: d/w daughter and granddaughter bedside  Disposition Plan: SNF with hospice / residential  hospice pending course over next 24 h   Consultants:  None   Procedures:  None   Antibiotics  Anti-infectives    Start     Dose/Rate Route Frequency Ordered Stop   12/15/14 2200  clindamycin (CLEOCIN) IVPB 600 mg  Status:  Discontinued     600 mg 100 mL/hr over 30 Minutes Intravenous 3 times per day 12/15/14 2052 12/15/14 2052      Studies  Ct Abdomen Pelvis W Contrast  12/15/2014   CLINICAL DATA:  Diffuse abdominal pain, nausea and vomiting. Colon resection 2014. History of diverticulitis.  EXAM: CT ABDOMEN AND PELVIS WITH CONTRAST  TECHNIQUE: Multidetector CT imaging of the abdomen and pelvis was performed using the standard protocol following bolus administration of intravenous contrast.  CONTRAST:  159m OMNIPAQUE IOHEXOL 300 MG/ML  SOLN  COMPARISON:  11/07/2014  FINDINGS: Lower chest: Curvilinear bibasilar scarring or atelectasis reidentified.  Hepatobiliary: Liver and gallbladder appear normal.  Pancreas: Normal, with pancreatic ductal diameter 2-3 mm at upper limits of normal but no other abnormality identified.  Spleen: Normal  Adrenals/Urinary Tract: Adrenal glands and kidneys appear normal with the exception of a stable subjectively fat density right lower renal pole 4 mm too small to characterize lesion image 26 which could represent focal scarring, angiomyolipoma, or possibly a small cyst. No hydroureteronephrosis. No radiopaque renal or ureteral calculus.  Stomach/Bowel: Multiple abnormal appearing dilated loops of small bowel are identified involving predominantly the mid to distal small bowel within the mid abdomen/pelvis, with swirling of the mesentery image 49 suggesting twist on the vascular pedicle with closed loop obstruction morphology or a rent in the mesentery with similar physiology. The  terminal ileum is decompressed, as is the colon. Colonic diverticuli noted without evidence for diverticulitis. Appendix is surgically absent. No bowel wall thickening is identified.   Vascular/Lymphatic: No lymphadenopathy. Extensive atherosclerotic aortic calcification with tortuosity noted but no aneurysm identified.  Reproductive: Uterus presumed surgically absent. Neither ovary is seen but no adnexal mass is visualized.  Other: Small ascites is present diffusely.  No free air.  Musculoskeletal: Extensive lumbar spine disc degenerative change noted. Dynamic left femoral nail is noted. No compression deformity allowing for the presence of osteopenia subjectively.  IMPRESSION: Dilated mid to distal small bowel loops with swirling of the mesentery and terminal ileal decompression suggesting closed loop obstruction which may be due to internal hernia or abnormal mobility. These results were called by telephone at the time of interpretation on 12/15/2014 at 7:48 pm to Dr. Debby Freiberg , who verbally acknowledged these results.   Electronically Signed   By: Conchita Paris M.D.   On: 12/15/2014 19:49   Dg Abd Acute W/chest  12/15/2014   CLINICAL DATA:  Epigastric abdominal pain, vomiting  EXAM: ACUTE ABDOMEN SERIES (ABDOMEN 2 VIEW & CHEST 1 VIEW)  COMPARISON:  None.  FINDINGS: There is no evidence of dilated bowel loops or free intraperitoneal air. No radiopaque calculi or other significant radiographic abnormality is seen. Heart size and mediastinal contours are within normal limits. Both lungs are clear. Dynamic left femoral nail partly visualized. Atheromatous aortic calcification and tortuosity noted.  IMPRESSION: Negative abdominal radiographs.  No acute cardiopulmonary disease.   Electronically Signed   By: Conchita Paris M.D.   On: 12/15/2014 18:44   Dg Abd Portable 1v  12/16/2014   CLINICAL DATA:  Acute onset of generalized abdominal pain. Follow-up small bowel obstruction. Subsequent encounter.  EXAM: PORTABLE ABDOMEN - 1 VIEW  COMPARISON:  Abdominal radiograph and CT of the abdomen and pelvis performed 12/15/2014  FINDINGS: There is dilatation of small bowel loops up to 3.6 cm in  diameter, somewhat more prominent than on the recent prior study. The recent prior CT demonstrated signs suggestive of closed loop obstruction.  Scattered air is noted within the colon, relatively stable from the prior study. Bowel suture lines are seen at the left mid abdomen. Contrast is seen partially filling the bladder. No free intra-abdominal air is seen, though evaluation for free air is limited on a single supine view.  No acute osseous abnormalities are seen. The patient's left hip hardware is grossly unremarkable, though incompletely imaged. Mild degenerative change is noted along the lower lumbar spine. Scattered vascular calcifications are seen.  IMPRESSION: 1. Mildly worsened dilatation of small-bowel loops, to 3.6 cm in maximal diameter. The recent prior CT demonstrated signs suggestive of closed loop obstruction. Would correlate clinically. No free intra-abdominal air seen. 2. Scattered vascular calcifications noted.   Electronically Signed   By: Garald Balding M.D.   On: 12/16/2014 09:06   Objective  Filed Vitals:   12/15/14 1639 12/15/14 1836 12/15/14 2140 12/16/14 0613  BP: 198/91 176/79 153/87 191/98  Pulse:  86 99 96  Temp:   98.2 F (36.8 C) 97.8 F (36.6 C)  TempSrc:   Oral Oral  Resp:  _0 Height:   _1  (1.6 m)   Weight:   38.057 kg (83 lb 14.4 oz)   SpO2:  97% 95% 94%    Intake/Output Summary (Last 24 hours) at 12/16/14 1021 Last data filed at 12/16/14 0600  Gross per 24 hour  Intake    660  ml  Output      0 ml  Net    660 ml   Filed Weights   12/15/14 2140  Weight: 38.057 kg (83 lb 14.4 oz)   Exam:  General:  Confused, NAD  HEENT: no scleral icterus  Cardiovascular: RRR without MRG, 2+ peripheral pulses, no edema  Respiratory: CTA biL, good air movement, no wheezing, no crackles, no rales  Abdomen: tender to palpation throughout  MSK/Extremities: no clubbing/cyanosis, no joint swelling  Skin: no rashes  Neuro: non focal  Data  Reviewed: Basic Metabolic Panel:  Recent Labs Lab 12/15/14 1733 12/16/14 0504  NA 132* 132*  K 3.8 4.0  CL 96 98  CO2 24 23  GLUCOSE 187* 161*  BUN 12 14  CREATININE 0.63 0.62  CALCIUM 9.4 9.0   Liver Function Tests:  Recent Labs Lab 12/15/14 1733  AST 37  ALT 26  ALKPHOS 92  BILITOT 0.5  PROT 8.1  ALBUMIN 4.5    Recent Labs Lab 12/15/14 1733  LIPASE 19   CBC:  Recent Labs Lab 12/15/14 1733 12/16/14 0504  WBC 12.0* 14.6*  NEUTROABS 10.8*  --   HGB 13.8 14.2  HCT 41.2 42.1  MCV 86.9 87.0  PLT 394 387   Scheduled Meds: . antiseptic oral rinse  7 mL Mouth Rinse q12n4p  . chlorhexidine  15 mL Mouth Rinse BID  . FLUoxetine  20 mg Oral Daily  . heparin  5,000 Units Subcutaneous 3 times per day  . ketoconazole  1 application Topical BID  . QUEtiapine  25 mg Oral QHS   Continuous Infusions: . sodium chloride 75 mL/hr at 12/16/14 9758   Time spent: 35 minutes  Marzetta Board, MD [facility] Pager [phone]. If 7 PM - 7 AM, please contact night-coverage at [email or web address], password [facility] 12/16/2014, 10:21 AM  LOS: 1 day

## 2014-12-17 ENCOUNTER — Inpatient Hospital Stay (HOSPITAL_COMMUNITY): Payer: Medicare Other

## 2014-12-17 MED ORDER — OXYCODONE HCL 5 MG/5ML PO SOLN: 5.0000 mg | ORAL | Status: DC | PRN

## 2014-12-17 MED ORDER — LORAZEPAM 2 MG/ML PO CONC: 0.5000 mg | ORAL | Status: DC | PRN

## 2014-12-17 MED ORDER — FAMOTIDINE IN NACL 20-0.9 MG/50ML-% IV SOLN
20.0000 mg | INTRAVENOUS | Status: DC
  Administered 2014-12-17 – 2014-12-18 (×2): 20 mg via INTRAVENOUS
  Filled 2014-12-17 (×3): qty 50

## 2014-12-17 MED ORDER — PANTOPRAZOLE SODIUM 40 MG IV SOLR
40.0000 mg | Freq: Two times a day (BID) | INTRAVENOUS | Status: DC
  Administered 2014-12-17 – 2014-12-18 (×3): 40 mg via INTRAVENOUS
  Filled 2014-12-17 (×5): qty 40

## 2014-12-17 NOTE — Progress Notes (Addendum)
PROGRESS NOTE  Edlyn Rosenburg GHW:[id number] DOB: 1918-02-25 DOA: 12/15/2014 PCP: Garnet Koyanagi, DO  HPI: Nathaly Dawkins is a 81 y.o. female brought in from [facility] with 1 day history of abdominal pain. Pain is generalized, sharp, no radiation, single episode of vomiting today per report. No constipation, no chills, no diarrhea, no dysuria, no fever. This occurs in the context of recent admission 1 month ago for SBO which was treated medically. SBO likely due to adhesions from multiple abdominal surgeries in the past  Subjective / 24 H Interval events - endorsing abdominal pain  Assessment/Plan: Principal Problem:   Small bowel obstruction Active Problems:   Dementia  SBO - CT with possible closed loop obstruction - family would not like to pursue surgery, this was discussed last month as well however at that time her SBO resolved on its own - abdominal film with worsening obstruction  Advanced dementia - progressively worsening per family, less and less ambulatory   History of diverticulosis - CT scan without evidence of diverticulitis   Depression  Protein calorie malnutrition  Goals of care - met 3/26 and 3/27 with patient's daughter and granddaughter, they do not feel like surgery would be a good option given advanced dementia and would unlikely to significantly improve patient's quality of life which is quite poor currently.  Obstruction worsening 3/27, I have consulted palliative as she may need inpatient hospice, family wishes that she remains here with inpatient hospice and don't want her to get transferred to a residential hospice facility.   - will start more comfort measures, Ativan for anxiety, Roxicodone for pain, PPI/Pepcid to decrease gastric secretions  Diet: Diet NPO time specified Except for: Ice Chips Fluids: NS DVT Prophylaxis: heparin  Code Status: DNR Family Communication: d/w daughter and granddaughter bedside  Disposition Plan: SNF with hospice /  residential hospice / GIP  Consultants:  None   Procedures:  None   Antibiotics  Anti-infectives    Start     Dose/Rate Route Frequency Ordered Stop   12/15/14 2200  clindamycin (CLEOCIN) IVPB 600 mg  Status:  Discontinued     600 mg 100 mL/hr over 30 Minutes Intravenous 3 times per day 12/15/14 2052 12/15/14 2052      Studies  Ct Abdomen Pelvis W Contrast  12/15/2014   CLINICAL DATA:  Diffuse abdominal pain, nausea and vomiting. Colon resection 2014. History of diverticulitis.  EXAM: CT ABDOMEN AND PELVIS WITH CONTRAST  TECHNIQUE: Multidetector CT imaging of the abdomen and pelvis was performed using the standard protocol following bolus administration of intravenous contrast.  CONTRAST:  150mL OMNIPAQUE IOHEXOL 300 MG/ML  SOLN  COMPARISON:  11/07/2014  FINDINGS: Lower chest: Curvilinear bibasilar scarring or atelectasis reidentified.  Hepatobiliary: Liver and gallbladder appear normal.  Pancreas: Normal, with pancreatic ductal diameter 2-3 mm at upper limits of normal but no other abnormality identified.  Spleen: Normal  Adrenals/Urinary Tract: Adrenal glands and kidneys appear normal with the exception of a stable subjectively fat density right lower renal pole 4 mm too small to characterize lesion image 26 which could represent focal scarring, angiomyolipoma, or possibly a small cyst. No hydroureteronephrosis. No radiopaque renal or ureteral calculus.  Stomach/Bowel: Multiple abnormal appearing dilated loops of small bowel are identified involving predominantly the mid to distal small bowel within the mid abdomen/pelvis, with swirling of the mesentery image 49 suggesting twist on the vascular pedicle with closed loop obstruction morphology or a rent in the mesentery with similar physiology. The terminal ileum is decompressed, as  is the colon. Colonic diverticuli noted without evidence for diverticulitis. Appendix is surgically absent. No bowel wall thickening is identified.   Vascular/Lymphatic: No lymphadenopathy. Extensive atherosclerotic aortic calcification with tortuosity noted but no aneurysm identified.  Reproductive: Uterus presumed surgically absent. Neither ovary is seen but no adnexal mass is visualized.  Other: Small ascites is present diffusely.  No free air.  Musculoskeletal: Extensive lumbar spine disc degenerative change noted. Dynamic left femoral nail is noted. No compression deformity allowing for the presence of osteopenia subjectively.  IMPRESSION: Dilated mid to distal small bowel loops with swirling of the mesentery and terminal ileal decompression suggesting closed loop obstruction which may be due to internal hernia or abnormal mobility. These results were called by telephone at the time of interpretation on 12/15/2014 at 7:48 pm to Dr. Debby Freiberg , who verbally acknowledged these results.   Electronically Signed   By: Conchita Paris M.D.   On: 12/15/2014 19:49   Dg Abd Acute W/chest  12/15/2014   CLINICAL DATA:  Epigastric abdominal pain, vomiting  EXAM: ACUTE ABDOMEN SERIES (ABDOMEN 2 VIEW & CHEST 1 VIEW)  COMPARISON:  None.  FINDINGS: There is no evidence of dilated bowel loops or free intraperitoneal air. No radiopaque calculi or other significant radiographic abnormality is seen. Heart size and mediastinal contours are within normal limits. Both lungs are clear. Dynamic left femoral nail partly visualized. Atheromatous aortic calcification and tortuosity noted.  IMPRESSION: Negative abdominal radiographs.  No acute cardiopulmonary disease.   Electronically Signed   By: Conchita Paris M.D.   On: 12/15/2014 18:44   Dg Abd Portable 1v  12/17/2014   CLINICAL DATA:  Small bowel obstruction  EXAM: PORTABLE ABDOMEN - 1 VIEW  COMPARISON:  12/16/2014  FINDINGS: Persistent mid abdominal small bowel dilatation is reidentified with maximal diameter of 3.8 cm. Presence or absence of air-fluid levels or free air is suboptimally evaluated on this supine  projection. Colon is decompressed. Left femoral dynamic nail partly visualized. Left upper quadrant anastomotic chain sutures are reidentified. Probable vicarious excretion of contrast within the gallbladder noted over the right upper quadrant. Coarsened pulmonary parenchymal interstitial markings are noted at the lung bases.  IMPRESSION: Slight interval increase in diameter of dilated mid abdominal loops compatible with small bowel obstruction.   Electronically Signed   By: Conchita Paris M.D.   On: 12/17/2014 07:11   Dg Abd Portable 1v  12/16/2014   CLINICAL DATA:  Acute onset of generalized abdominal pain. Follow-up small bowel obstruction. Subsequent encounter.  EXAM: PORTABLE ABDOMEN - 1 VIEW  COMPARISON:  Abdominal radiograph and CT of the abdomen and pelvis performed 12/15/2014  FINDINGS: There is dilatation of small bowel loops up to 3.6 cm in diameter, somewhat more prominent than on the recent prior study. The recent prior CT demonstrated signs suggestive of closed loop obstruction.  Scattered air is noted within the colon, relatively stable from the prior study. Bowel suture lines are seen at the left mid abdomen. Contrast is seen partially filling the bladder. No free intra-abdominal air is seen, though evaluation for free air is limited on a single supine view.  No acute osseous abnormalities are seen. The patient's left hip hardware is grossly unremarkable, though incompletely imaged. Mild degenerative change is noted along the lower lumbar spine. Scattered vascular calcifications are seen.  IMPRESSION: 1. Mildly worsened dilatation of small-bowel loops, to 3.6 cm in maximal diameter. The recent prior CT demonstrated signs suggestive of closed loop obstruction. Would correlate clinically. No free intra-abdominal  air seen. 2. Scattered vascular calcifications noted.   Electronically Signed   By: Garald Balding M.D.   On: 12/16/2014 09:06   Objective  Filed Vitals:   12/16/14 1400 12/16/14 2203  12/17/14 0542 12/17/14 0616  BP: 165/81 169/82 180/82 148/71  Pulse: 80 93 93 94  Temp: 97.6 F (36.4 C) 99 F (37.2 C) 98.6 F (37 C)   TempSrc: Oral Oral Oral   Resp: $Remo'18 16 16   'iaEuP$ Height:      Weight:      SpO2: 100% 93% 90%     Intake/Output Summary (Last 24 hours) at 12/17/14 1334 Last data filed at 12/17/14 0800  Gross per 24 hour  Intake   1650 ml  Output      0 ml  Net   1650 ml   Filed Weights   12/15/14 2140  Weight: 38.057 kg (83 lb 14.4 oz)   Exam:  General:  Confused, sleeping but uncomfortable when woken up  HEENT: no scleral icterus  Cardiovascular: RRR without MRG, 2+ peripheral pulses, no edema  Respiratory: CTA biL, good air movement, no wheezing, no crackles, no rales  Abdomen: tender to palpation throughout  Data Reviewed: Basic Metabolic Panel:  Recent Labs Lab 12/15/14 1733 12/16/14 0504  NA 132* 132*  K 3.8 4.0  CL 96 98  CO2 24 23  GLUCOSE 187* 161*  BUN 12 14  CREATININE 0.63 0.62  CALCIUM 9.4 9.0   Liver Function Tests:  Recent Labs Lab 12/15/14 1733  AST 37  ALT 26  ALKPHOS 92  BILITOT 0.5  PROT 8.1  ALBUMIN 4.5    Recent Labs Lab 12/15/14 1733  LIPASE 19   CBC:  Recent Labs Lab 12/15/14 1733 12/16/14 0504  WBC 12.0* 14.6*  NEUTROABS 10.8*  --   HGB 13.8 14.2  HCT 41.2 42.1  MCV 86.9 87.0  PLT 394 387   Scheduled Meds: . antiseptic oral rinse  7 mL Mouth Rinse q12n4p  . chlorhexidine  15 mL Mouth Rinse BID  . FLUoxetine  20 mg Oral Daily  . heparin  5,000 Units Subcutaneous 3 times per day  . ketoconazole  1 application Topical BID  . QUEtiapine  25 mg Oral QHS   Continuous Infusions: . sodium chloride 75 mL/hr at 12/17/14 0809    Marzetta Board, MD [facility] Pager [phone]. If 7 PM - 7 AM, please contact night-coverage at [email or web address], password [facility] 12/17/2014, 1:34 PM  LOS: 2 days

## 2014-12-18 ENCOUNTER — Inpatient Hospital Stay (HOSPITAL_COMMUNITY): Payer: Medicare Other

## 2014-12-18 DIAGNOSIS — K559 Vascular disorder of intestine, unspecified: Secondary | ICD-10-CM | POA: Diagnosis present

## 2014-12-18 DIAGNOSIS — R1084 Generalized abdominal pain: Secondary | ICD-10-CM

## 2014-12-18 DIAGNOSIS — Z515 Encounter for palliative care: Secondary | ICD-10-CM

## 2014-12-18 MED ORDER — ONDANSETRON HCL 4 MG/2ML IJ SOLN
4.0000 mg | Freq: Four times a day (QID) | INTRAMUSCULAR | Status: DC
  Administered 2014-12-18 – 2014-12-19 (×2): 4 mg via INTRAVENOUS
  Filled 2014-12-18 (×2): qty 2

## 2014-12-18 MED ORDER — HYDROMORPHONE HCL 1 MG/ML IJ SOLN
0.5000 mg | INTRAMUSCULAR | Status: DC | PRN
Start: 2014-12-18 — End: 2014-12-19
  Administered 2014-12-18 (×2): 0.5 mg via INTRAVENOUS
  Filled 2014-12-18 (×2): qty 1

## 2014-12-18 MED ORDER — LORAZEPAM 2 MG/ML PO CONC
0.5000 mg | Freq: Four times a day (QID) | ORAL | Status: DC
  Administered 2014-12-18 – 2014-12-19 (×2): 0.5 mg via ORAL
  Filled 2014-12-18 (×2): qty 1

## 2014-12-18 MED ORDER — GLYCOPYRROLATE 0.2 MG/ML IJ SOLN
0.1000 mg | Freq: Two times a day (BID) | INTRAMUSCULAR | Status: DC
  Administered 2014-12-18 (×2): 0.1 mg via INTRAVENOUS
  Filled 2014-12-18 (×4): qty 0.5

## 2014-12-18 MED ORDER — FENTANYL 12 MCG/HR TD PT72
12.5000 ug | MEDICATED_PATCH | TRANSDERMAL | Status: DC
  Administered 2014-12-18: 12.5 ug via TRANSDERMAL
  Filled 2014-12-18: qty 1

## 2014-12-18 MED ORDER — METOCLOPRAMIDE HCL 5 MG/ML IJ SOLN
5.0000 mg | Freq: Two times a day (BID) | INTRAMUSCULAR | Status: DC
  Administered 2014-12-18 (×2): 5 mg via INTRAVENOUS
  Filled 2014-12-18: qty 2
  Filled 2014-12-18 (×2): qty 1

## 2014-12-18 NOTE — Progress Notes (Addendum)
CSW assisting with d/c planning. PN reviewed. [facility] has been contacted for assistance.   Cori RazorJamie Brianah Hopson LCSW [phone]  1552 : Contacted by Darrol PokeEva, [facility] liaision, reporting there will be a bed available for pt on 3/29.  Cori RazorJamie Jeanne Diefendorf LCSW [phone].

## 2014-12-18 NOTE — Progress Notes (Signed)
Clinical Social Work Department BRIEF PSYCHOSOCIAL ASSESSMENT 12/18/2014  Patient:  Ashley Burnett, Ashley Burnett     Account Number:  [id number]     Admit date:  12/15/2014  Clinical Social Worker:  Tilda Burrow, CLINICAL SOCIAL WORKER  Date/Time:  12/18/2014 11:24 AM  Referred by:  CSW  Date Referred:  12/18/2014 Referred for  SNF Placement   Other Referral:   Interview type:  Patient Other interview type:   There was no family or friends present. Pt was the only interview type.    PSYCHOSOCIAL DATA Living Status:  FACILITY Admitted from facility:   Level of care:  Skilled Nursing Facility Primary support name:  Kae Primary support relationship to patient:  CHILD, ADULT Degree of support available:   Patient informed CSW that her daughter Kamilah is to be considered her primiary support.    CURRENT CONCERNS Current Concerns  Adjustment to Illness   Other Concerns:    SOCIAL WORK ASSESSMENT / PLAN CSW met with pt at bedside. There was no family present. Patient states that she does not remember why she presents to [facility]. Per note, patient presents to [facility] due to c/o abdominal pain after prunes and prune juice.    Patient informed CSW that her waist, hip, and back hurts.    Patient appears to be forgetful. Patient also informed CSW that she was unsure if she came from [place]. CSW checked pt chart which confirms that the pt is from [place]. Per chart, pt has a history of dementia.    Patient states that she does receives assistance with completing ADL's. However, she states that she can complete her ADL's independently sometimes. Patient is not sure if she has fallen within the past 6 months.    Patient informed CSW that her support sytem includes her daughter named Cecylia.    Patient did not state any concerns and did not have any questions.   Assessment/plan status:  Information/Referral to [employer] Other assessment/ plan:   Information/referral to community resources:    Pet note from the  Unit CSW, she is assisting with the pt's dicharge needs. She notes that [employer] has been contacted for assistance.    Unit CSW's Contact Number: [phone]    PATIENT'S/FAMILY'S RESPONSE TO PLAN OF CARE: Patient appeared to be forgetful during the interview. There was no family present.

## 2014-12-18 NOTE — Care Management Note (Signed)
    Page 1 of 1   12/18/2014     1:54:47 PM CARE MANAGEMENT NOTE 12/18/2014  Patient:  Ashley Burnett,Ashley Burnett   Account Number:  [id number]  Date Initiated:  12/18/2014  Documentation initiated by:  Lorenda IshiharaPEELE,Jhanvi Drakeford  Subjective/Objective Assessment:   81 yo female admitted with SBO. PTA lived at [facility].     Action/Plan:   Palliative Care Consult for GOC   Anticipated DC Date:  12/18/2014   Anticipated DC Plan:  HOSPICE MEDICAL FACILITY  In-house referral  Clinical Social Worker      DC Planning Services  CM consult      Choice offered to / List presented to:             Status of service:  Completed, signed off Medicare Important Message given?   (If response is "NO", the following Medicare IM given date fields will be blank) Date Medicare IM given:   Medicare IM given by:   Date Additional Medicare IM given:   Additional Medicare IM given by:    Discharge Disposition:  HOSPICE MEDICAL FACILITY  Per UR Regulation:  Reviewed for med. necessity/level of care/duration of stay  If discussed at Long Length of Stay Meetings, dates discussed:    Comments:

## 2014-12-18 NOTE — Consult Note (Signed)
Palliative Medicine Team at [facility]  Date: 12/18/2014   Patient Name: Ashley Burnett  DOB: 1918/02/21  MRN: [id number]  Age / Sex: 81 y.o., female   PCP: Rosalita Chessman, DO Referring Physician: Caren Griffins, MD  Active Problems: Principal Problem:   Small bowel obstruction Active Problems:   Dementia   HPI/Reason for Consultation: Ashley Burnett is a 81 y.o. female with advanced vascular dementia admitted with complete, closed loop bowel obstruction with ischemia -not a surgical candidate-worsening abdominal pain. PMT consulted for comfort care transition, symptom management for pain, EOL issues and care coordination. Family at bedside, daughter is HCPOA, grandaughter also helps with caregiving.  Participants in Discussion: HCPOA: yes   Advance Directive:    Code Status Orders        Start     Ordered   12/15/14 2054  Do not attempt resuscitation (DNR)   Continuous    Question Answer Comment  In the event of cardiac or respiratory ARREST Do not call a "code blue"   In the event of cardiac or respiratory ARREST Do not perform Intubation, CPR, defibrillation or ACLS   In the event of cardiac or respiratory ARREST Use medication by any route, position, wound care, and other measures to relive pain and suffering. May use oxygen, suction and manual treatment of airway obstruction as needed for comfort.      12/15/14 2053    Advance Directive Documentation        Most Recent Value   Type of Advance Directive  Healthcare Power of Attorney   Pre-existing out of facility DNR order (yellow form or pink MOST form)     "MOST" Form in Place?         '@ADVDIR'$ @  I have reviewed the medical record, interviewed the patient and family, and examined the patient. The following aspects are pertinent.  Past Medical History  Diagnosis Date  . Diverticulosis 2001    universal,severe with recurrent diverticulitis.   . Family history of GERD   . Osteoporosis   . Transient ischemic attack    . Fracture     leg wrist  . High cholesterol   . Depression   . Gastritis 2008    seen on EGD  . Arthritis   . Dementia   . Lower GI bleed 03/2014    likely diverticular bleed.   . Anemia due to blood loss 03/2014   History   Social History  . Marital Status: Widowed    Spouse Name: N/A  . Number of Children: N/A  . Years of Education: N/A   Social History Main Topics  . Smoking status: Former Research scientist (life sciences)  . Smokeless tobacco: Former Systems developer  . Alcohol Use: No  . Drug Use: No  . Sexual Activity: Not on file   Other Topics Concern  . None   Social History Narrative   Family History  Problem Relation Age of Onset  . Stroke    . Colon cancer    . Heart disease      aunt  . Melanoma Mother   . Cancer Mother     melenoma  . Tuberculosis Father    Scheduled Meds: . antiseptic oral rinse  7 mL Mouth Rinse q12n4p  . chlorhexidine  15 mL Mouth Rinse BID  . famotidine (PEPCID) IV  20 mg Intravenous Q24H  . fentaNYL  12.5 mcg Transdermal Q48H  . glycopyrrolate  0.1 mg Intravenous BID  . ketoconazole  1 application Topical BID  .  LORazepam  0.5 mg Oral 4 times per day  . metoCLOPramide (REGLAN) injection  5 mg Intravenous Q12H  . ondansetron (ZOFRAN) IV  4 mg Intravenous 4 times per day  . pantoprazole (PROTONIX) IV  40 mg Intravenous Q12H   Continuous Infusions: . sodium chloride 50 mL/hr at 12/17/14 2237   PRN Meds:.acetaminophen, HYDROmorphone (DILAUDID) injection Allergies  Allergen Reactions  . Amoxicillin     On MAR  . Codeine Other (See Comments)    unknown  . Indomethacin Other (See Comments)    Unknown. Spoke to granddaughter on 02/05/13 who stated she was not aware of this allergy and that her grandmother (Ashley Burnett) does not remember this allergy either.   . Morphine And Related Other (See Comments)    unknown   CBC:    Component Value Date/Time   WBC 14.6* 12/16/2014 0504   HGB 14.2 12/16/2014 0504   HCT 42.1 12/16/2014 0504   PLT 387 12/16/2014  0504   MCV 87.0 12/16/2014 0504   NEUTROABS 10.8* 12/15/2014 1733   LYMPHSABS 0.7 12/15/2014 1733   MONOABS 0.5 12/15/2014 1733   EOSABS 0.0 12/15/2014 1733   BASOSABS 0.0 12/15/2014 1733   Comprehensive Metabolic Panel:    Component Value Date/Time   NA 132* 12/16/2014 0504   K 4.0 12/16/2014 0504   CL 98 12/16/2014 0504   CO2 23 12/16/2014 0504   BUN 14 12/16/2014 0504   CREATININE 0.62 12/16/2014 0504   GLUCOSE 161* 12/16/2014 0504   GLUCOSE 104* 08/06/2006 1001   CALCIUM 9.0 12/16/2014 0504   AST 37 12/15/2014 1733   ALT 26 12/15/2014 1733   ALKPHOS 92 12/15/2014 1733   BILITOT 0.5 12/15/2014 1733   PROT 8.1 12/15/2014 1733   ALBUMIN 4.5 12/15/2014 1733    Vital Signs: BP 145/64 mmHg  Pulse 91  Temp(Src) 99.6 F (37.6 C) (Oral)  Resp 16  Ht $R'5\' 3"'zs$  (1.6 m)  Wt 38.057 kg (83 lb 14.4 oz)  BMI 14.87 kg/m2  SpO2 96% Filed Weights   12/15/14 2140  Weight: 38.057 kg (83 lb 14.4 oz)   03/27 0701 - 03/28 0700 In: 1381.7 [I.V.:1331.7; IV Piggyback:50] Out: -   Physical Exam:  Extremely frail, cachectic Curled up in fetal position, awakens easily -quite alert and appropriate- she asks me for coke and a straw- she has little if any insight into her condition-despite being told multipkle times by family and providers but each time experiences suffering over hearing the news and cannot really process what is happening to her- very anxious on discussion re: serious condition. No rhonchi. Her belly is extremely tense and distended- she is guarding +  Summary of Established Goals of Care and Medical Treatment Preferences  Met with her family- they understand the severity of her condition and want peace and comfort for her as she approaches EOL. They are open to hospice options-goals are full comfort- they are very nervous about moving her from hospital but I believe she is stable for transport to a hospice house and that this would be the best plan for her- she has had very  difficult to control abdominal pain, nausea and is showing signs of rapid decline.   Primary Diagnoses  Closed Loop bowel obstruction with mesenteric ischemia-bowel infarction-too frail for surgery  Active Symptoms: 1. Severe abdominal pain 2. Agitation/anxiety 3. Nausea/Vomiting  Psycho-social/Spiritual: Daughter and grandaughter very anxious but accepting of current trajectory of her illness-they have caregiver burnot- has been a very rough past 2  years for them.  Prognosis: <2 weeks, suspect only a few more days   Palliative Performance Scale: 20%  Recommendations:  1. Code Status: DNR 2. Scope of Treatment:  1. Allow for sips of clears for comfort 2. Full comfort care 3. No labs or invasive procedures/no life prolonging medication  3. Symptom Management:  1. Stated Morphine Allergy- will place a Fentanyl patch for basal pain control with q48 dosing  2. Scheduled q6 hour Ativan for her anxiety-family say she has taken this for years 3. Scheduled zofran and reglan  4. Glycopyrolate BID to reduce bowel spasms and secretions 4. Palliative Prophylaxis: As above- colon is decompressed no issues with constipation given closed loop- monitor closely she may begin vomiting soon and will need proper positioning. 5. Disposition:  Family agreeable to referral to [facility]- I reassured them that we would not transport her if she was too unstable to move.  Time : 10:45-12 noon  Time Total: 75 min Greater than 50%  of this time was spent counseling and coordinating care related to the above assessment and plan.  Signed by: Roma Schanz, DO  12/18/2014, 11:49 AM  Please contact Palliative Medicine Team phone at [phone] for questions and concerns.

## 2014-12-18 NOTE — Consult Note (Signed)
[facility] Liaison: [facility] room available for patient 12/19/2014. Registration paperwork completed with daughter Ander SladeJoy this afternoon. Dr. Kern Reaponald Hertweck to assume care per family request. Please fax discharge summary to [phone]. RN Please call report to [phone]. Please arrange transport for patient to arrive before noon. Thank you. Forrestine Himva Henna Derderian LCSW [phone]

## 2014-12-18 NOTE — Progress Notes (Signed)
PROGRESS NOTE  Ashley Burnett:[id number] DOB: May 01, 1918 DOA: 12/15/2014 PCP: Loreen Freud, DO  HPI: Ashley Burnett is a 81 y.o. female brought in from [facility] with 1 day history of abdominal pain. Pain is generalized, sharp, no radiation, single episode of vomiting today per report. No constipation, no chills, no diarrhea, no dysuria, no fever. This occurs in the context of recent admission 1 month ago for SBO which was treated medically. SBO likely due to adhesions from multiple abdominal surgeries in the past  Subjective / 24 H Interval events - curled up in fetal position  Assessment/Plan: Principal Problem:   Small bowel obstruction Active Problems:   Dementia   Small bowel ischemia  SBO - CT with possible closed loop obstruction - family would not like to pursue surgery, this was discussed last month as well however at that time her SBO resolved on its own - abdominal film with worsening obstruction - abdomen more tender, with guarding today - palliative consulted, plan for residential hospice  Advanced dementia - progressively worsening per family, less and less ambulatory   History of diverticulosis - CT scan without evidence of diverticulitis   Depression  Protein calorie malnutrition  Goals of care - palliative consulted  Diet: Diet clear liquid Room service appropriate?: Yes; Fluid consistency:: Thin Fluids: NS DVT Prophylaxis: heparin  Code Status: DNR Family Communication: d/w daughter and granddaughter bedside  Disposition Plan: residential hospice   Consultants:  None   Procedures:  None   Antibiotics  Anti-infectives    Start     Dose/Rate Route Frequency Ordered Stop   12/15/14 2200  clindamycin (CLEOCIN) IVPB 600 mg  Status:  Discontinued     600 mg 100 mL/hr over 30 Minutes Intravenous 3 times per day 12/15/14 2052 12/15/14 2052      Studies  Dg Abd Portable 1v  12/18/2014   CLINICAL DATA:  Bowel obstruction.  EXAM: PORTABLE ABDOMEN  - 1 VIEW  COMPARISON:  12/17/2014.  CT 12/15/2014.  FINDINGS: Persistent distended small bowel loops are noted. Small-bowel distention is increased slightly from prior exam. Maximum diameter is now 4.1 cm colon is decompressed. No free air. Surgical sutures noted left upper abdomen. Postsurgical changes left hip. Degenerative changes lumbar spine. Pelvic phleboliths. Aortoiliac atherosclerotic vascular disease.  IMPRESSION: Progressive distention of small bowel consistent with small bowel obstruction.   Electronically Signed   By: Maisie Fus  Register   On: 12/18/2014 07:14   Dg Abd Portable 1v  12/17/2014   CLINICAL DATA:  Small bowel obstruction  EXAM: PORTABLE ABDOMEN - 1 VIEW  COMPARISON:  12/16/2014  FINDINGS: Persistent mid abdominal small bowel dilatation is reidentified with maximal diameter of 3.8 cm. Presence or absence of air-fluid levels or free air is suboptimally evaluated on this supine projection. Colon is decompressed. Left femoral dynamic nail partly visualized. Left upper quadrant anastomotic chain sutures are reidentified. Probable vicarious excretion of contrast within the gallbladder noted over the right upper quadrant. Coarsened pulmonary parenchymal interstitial markings are noted at the lung bases.  IMPRESSION: Slight interval increase in diameter of dilated mid abdominal loops compatible with small bowel obstruction.   Electronically Signed   By: Christiana Pellant M.D.   On: 12/17/2014 07:11   Objective  Filed Vitals:   12/17/14 0616 12/17/14 1437 12/17/14 2218 12/18/14 0625  BP: 148/71 144/64 166/76 145/64  Pulse: 94 85 97 91  Temp:  97.6 F (36.4 C) 99.5 F (37.5 C) 99.6 F (37.6 C)  TempSrc:  Oral Oral Oral  Resp:  16 16 16   Height:      Weight:      SpO2:  96% 96% 96%    Intake/Output Summary (Last 24 hours) at 12/18/14 1212 Last data filed at 12/18/14 1000  Gross per 24 hour  Intake 1006.67 ml  Output    250 ml  Net 756.67 ml   Filed Weights   12/15/14 2140    Weight: 38.057 kg (83 lb 14.4 oz)   Exam:  General:  Confused, sleeping but uncomfortable when woken up  HEENT: no scleral icterus  Abdomen: +guarding, tender  Data Reviewed: Basic Metabolic Panel:  Recent Labs Lab 12/15/14 1733 12/16/14 0504  NA 132* 132*  K 3.8 4.0  CL 96 98  CO2 24 23  GLUCOSE 187* 161*  BUN 12 14  CREATININE 0.63 0.62  CALCIUM 9.4 9.0   Liver Function Tests:  Recent Labs Lab 12/15/14 1733  AST 37  ALT 26  ALKPHOS 92  BILITOT 0.5  PROT 8.1  ALBUMIN 4.5    Recent Labs Lab 12/15/14 1733  LIPASE 19   CBC:  Recent Labs Lab 12/15/14 1733 12/16/14 0504  WBC 12.0* 14.6*  NEUTROABS 10.8*  --   HGB 13.8 14.2  HCT 41.2 42.1  MCV 86.9 87.0  PLT 394 387   Scheduled Meds: . antiseptic oral rinse  7 mL Mouth Rinse q12n4p  . chlorhexidine  15 mL Mouth Rinse BID  . famotidine (PEPCID) IV  20 mg Intravenous Q24H  . fentaNYL  12.5 mcg Transdermal Q48H  . glycopyrrolate  0.1 mg Intravenous BID  . ketoconazole  1 application Topical BID  . LORazepam  0.5 mg Oral 4 times per day  . metoCLOPramide (REGLAN) injection  5 mg Intravenous Q12H  . ondansetron (ZOFRAN) IV  4 mg Intravenous 4 times per day  . pantoprazole (PROTONIX) IV  40 mg Intravenous Q12H   Continuous Infusions: . sodium chloride 10 mL/hr (12/18/14 1148)    Pamella Pertostin Iokepa Geffre, MD [facility] Pager [phone]. If 7 PM - 7 AM, please contact night-coverage at [email or web address], password [email or web address] 12/18/2014, 12:12 PM  LOS: 3 days

## 2014-12-19 DIAGNOSIS — K559 Vascular disorder of intestine, unspecified: Secondary | ICD-10-CM

## 2014-12-19 MED ORDER — FENTANYL 12 MCG/HR TD PT72: 12.5000 ug | MEDICATED_PATCH | TRANSDERMAL | Status: AC

## 2014-12-19 MED ORDER — LORAZEPAM 2 MG/ML PO CONC: 0.5000 mg | Freq: Four times a day (QID) | ORAL | Status: AC

## 2014-12-19 NOTE — Progress Notes (Signed)
[facility] and nsg has called report. Message left on granddaughter's answering machine that pt was ready to transfer to hospice home. CSW # left for granddaughter to call csw with any questions. [facility] called for transport.  Cori RazorJamie Ricco Dershem LCSW [phone]

## 2014-12-19 NOTE — Progress Notes (Signed)
[facility], patient appears to be no distress at this moment Stanford BreedBracey, Faheem Ziemann N RN 12-19-2014 9:32am

## 2014-12-19 NOTE — Discharge Summary (Signed)
Physician Discharge Summary  Ashley Burnett HYQ:[id number] DOB: December 09, 1917 DOA: 12/15/2014  PCP: Garnet Koyanagi, DO  Admit date: 12/15/2014 Discharge date: 12/19/2014  Time spent: < 30 minutes  Recommendations for Outpatient Follow-up:  1. Discharged to residential hospice   Discharge Diagnoses:  Principal Problem:   Small bowel obstruction Active Problems:   Dementia   Small bowel ischemia  Discharge Condition: to hospice  [facility] Weights   12/15/14 2140  Weight: 38.057 kg (83 lb 14.4 oz)   History of present illness:  Ashley Burnett is a 81 y.o. female brought in from ECF with 1 day history of abdominal pain. Pain is generalized, sharp, no radiation, single episode of vomiting today per report. No constipation, no chills, no diarrhea, no dysuria, no fever. This occurs in the context of recent admission 1 month ago for SBO which was treated medically. SBO likely due to adhesions from multiple abdominal surgeries in the past.  Hospital Course:  SBO - CT with possible closed loop obstruction - met 3/26 and 3/27 with patient's daughter and granddaughter, they do not feel like surgery would be a good option given advanced dementia and would unlikely to significantly improve patient's quality of life which is quite poor currently. I agree, also pallitaive care was consulted and have followed patient while hospitalized.  Advanced dementia - progressively worsening per family, less and less ambulatory  History of diverticulosis - CT scan without evidence of diverticulitis  Depression Protein calorie malnutrition  Procedures:  None    Consultations:  Palliative care  Discharge Exam: Filed Vitals:   12/18/14 1353 12/18/14 2236 12/19/14 0604 12/19/14 0847  BP: 157/75 155/76 181/90   Pulse: 74 83 104 92  Temp: 97.8 F (36.6 C) 98.9 F (37.2 C) 89.5 F (31.9 C) 98.6 F (37 C)  TempSrc: Oral Oral Oral Oral  Resp: $Remo'14 14 14   'bpJln$ Height:      Weight:      SpO2: 98% 95% 95%      General: NAD, frail, awake Cardiovascular: RRR  Discharge Instructions     Medication List    STOP taking these medications        feeding supplement (ENSURE COMPLETE) Liqd     FLUoxetine 20 MG capsule  Commonly known as:  PROZAC     HYDROcodone-acetaminophen 5-325 MG per tablet  Commonly known as:  NORCO/VICODIN     ketoconazole 2 % cream  Commonly known as:  NIZORAL     QUEtiapine 25 MG tablet  Commonly known as:  SEROQUEL     zolpidem 5 MG tablet  Commonly known as:  AMBIEN      TAKE these medications        acetaminophen 325 MG tablet  Commonly known as:  TYLENOL  Take 2 tablets (650 mg total) by mouth every 6 (six) hours as needed.     fentaNYL 12 MCG/HR  Commonly known as:  DURAGESIC - dosed mcg/hr  Place 1 patch (12.5 mcg total) onto the skin every other day.     LORazepam 2 MG/ML concentrated solution  Commonly known as:  ATIVAN  Take 0.3 mLs (0.6 mg total) by mouth every 6 (six) hours.         The results of significant diagnostics from this hospitalization (including imaging, microbiology, ancillary and laboratory) are listed below for reference.    Significant Diagnostic Studies: Ct Abdomen Pelvis W Contrast  12/15/2014   CLINICAL DATA:  Diffuse abdominal pain, nausea and vomiting. Colon resection 2014. History of  diverticulitis.  EXAM: CT ABDOMEN AND PELVIS WITH CONTRAST  TECHNIQUE: Multidetector CT imaging of the abdomen and pelvis was performed using the standard protocol following bolus administration of intravenous contrast.  CONTRAST:  197mL OMNIPAQUE IOHEXOL 300 MG/ML  SOLN  COMPARISON:  11/07/2014  FINDINGS: Lower chest: Curvilinear bibasilar scarring or atelectasis reidentified.  Hepatobiliary: Liver and gallbladder appear normal.  Pancreas: Normal, with pancreatic ductal diameter 2-3 mm at upper limits of normal but no other abnormality identified.  Spleen: Normal  Adrenals/Urinary Tract: Adrenal glands and kidneys appear normal with the  exception of a stable subjectively fat density right lower renal pole 4 mm too small to characterize lesion image 26 which could represent focal scarring, angiomyolipoma, or possibly a small cyst. No hydroureteronephrosis. No radiopaque renal or ureteral calculus.  Stomach/Bowel: Multiple abnormal appearing dilated loops of small bowel are identified involving predominantly the mid to distal small bowel within the mid abdomen/pelvis, with swirling of the mesentery image 49 suggesting twist on the vascular pedicle with closed loop obstruction morphology or a rent in the mesentery with similar physiology. The terminal ileum is decompressed, as is the colon. Colonic diverticuli noted without evidence for diverticulitis. Appendix is surgically absent. No bowel wall thickening is identified.  Vascular/Lymphatic: No lymphadenopathy. Extensive atherosclerotic aortic calcification with tortuosity noted but no aneurysm identified.  Reproductive: Uterus presumed surgically absent. Neither ovary is seen but no adnexal mass is visualized.  Other: Small ascites is present diffusely.  No free air.  Musculoskeletal: Extensive lumbar spine disc degenerative change noted. Dynamic left femoral nail is noted. No compression deformity allowing for the presence of osteopenia subjectively.  IMPRESSION: Dilated mid to distal small bowel loops with swirling of the mesentery and terminal ileal decompression suggesting closed loop obstruction which may be due to internal hernia or abnormal mobility. These results were called by telephone at the time of interpretation on 12/15/2014 at 7:48 pm to Dr. Debby Freiberg , who verbally acknowledged these results.   Electronically Signed   By: Conchita Paris M.D.   On: 12/15/2014 19:49   Dg Abd Acute W/chest  12/15/2014   CLINICAL DATA:  Epigastric abdominal pain, vomiting  EXAM: ACUTE ABDOMEN SERIES (ABDOMEN 2 VIEW & CHEST 1 VIEW)  COMPARISON:  None.  FINDINGS: There is no evidence of dilated  bowel loops or free intraperitoneal air. No radiopaque calculi or other significant radiographic abnormality is seen. Heart size and mediastinal contours are within normal limits. Both lungs are clear. Dynamic left femoral nail partly visualized. Atheromatous aortic calcification and tortuosity noted.  IMPRESSION: Negative abdominal radiographs.  No acute cardiopulmonary disease.   Electronically Signed   By: Conchita Paris M.D.   On: 12/15/2014 18:44   Dg Abd Portable 1v  12/18/2014   CLINICAL DATA:  Bowel obstruction.  EXAM: PORTABLE ABDOMEN - 1 VIEW  COMPARISON:  12/17/2014.  CT 12/15/2014.  FINDINGS: Persistent distended small bowel loops are noted. Small-bowel distention is increased slightly from prior exam. Maximum diameter is now 4.1 cm colon is decompressed. No free air. Surgical sutures noted left upper abdomen. Postsurgical changes left hip. Degenerative changes lumbar spine. Pelvic phleboliths. Aortoiliac atherosclerotic vascular disease.  IMPRESSION: Progressive distention of small bowel consistent with small bowel obstruction.   Electronically Signed   By: Marcello Moores  Register   On: 12/18/2014 07:14   Dg Abd Portable 1v  12/17/2014   CLINICAL DATA:  Small bowel obstruction  EXAM: PORTABLE ABDOMEN - 1 VIEW  COMPARISON:  12/16/2014  FINDINGS: Persistent mid abdominal small  bowel dilatation is reidentified with maximal diameter of 3.8 cm. Presence or absence of air-fluid levels or free air is suboptimally evaluated on this supine projection. Colon is decompressed. Left femoral dynamic nail partly visualized. Left upper quadrant anastomotic chain sutures are reidentified. Probable vicarious excretion of contrast within the gallbladder noted over the right upper quadrant. Coarsened pulmonary parenchymal interstitial markings are noted at the lung bases.  IMPRESSION: Slight interval increase in diameter of dilated mid abdominal loops compatible with small bowel obstruction.   Electronically Signed   By:  Conchita Paris M.D.   On: 12/17/2014 07:11   Dg Abd Portable 1v  12/16/2014   CLINICAL DATA:  Acute onset of generalized abdominal pain. Follow-up small bowel obstruction. Subsequent encounter.  EXAM: PORTABLE ABDOMEN - 1 VIEW  COMPARISON:  Abdominal radiograph and CT of the abdomen and pelvis performed 12/15/2014  FINDINGS: There is dilatation of small bowel loops up to 3.6 cm in diameter, somewhat more prominent than on the recent prior study. The recent prior CT demonstrated signs suggestive of closed loop obstruction.  Scattered air is noted within the colon, relatively stable from the prior study. Bowel suture lines are seen at the left mid abdomen. Contrast is seen partially filling the bladder. No free intra-abdominal air is seen, though evaluation for free air is limited on a single supine view.  No acute osseous abnormalities are seen. The patient's left hip hardware is grossly unremarkable, though incompletely imaged. Mild degenerative change is noted along the lower lumbar spine. Scattered vascular calcifications are seen.  IMPRESSION: 1. Mildly worsened dilatation of small-bowel loops, to 3.6 cm in maximal diameter. The recent prior CT demonstrated signs suggestive of closed loop obstruction. Would correlate clinically. No free intra-abdominal air seen. 2. Scattered vascular calcifications noted.   Electronically Signed   By: Garald Balding M.D.   On: 12/16/2014 09:06    Microbiology: No results found for this or any previous visit (from the past 240 hour(s)).   Labs: Basic Metabolic Panel:  Recent Labs Lab 12/15/14 1733 12/16/14 0504  NA 132* 132*  K 3.8 4.0  CL 96 98  CO2 24 23  GLUCOSE 187* 161*  BUN 12 14  CREATININE 0.63 0.62  CALCIUM 9.4 9.0   Liver Function Tests:  Recent Labs Lab 12/15/14 1733  AST 37  ALT 26  ALKPHOS 92  BILITOT 0.5  PROT 8.1  ALBUMIN 4.5    Recent Labs Lab 12/15/14 1733  LIPASE 19   CBC:  Recent Labs Lab 12/15/14 1733  12/16/14 0504  WBC 12.0* 14.6*  NEUTROABS 10.8*  --   HGB 13.8 14.2  HCT 41.2 42.1  MCV 86.9 87.0  PLT 394 387    Signed:  Lashanna Angelo  [facility] 12/19/2014, 8:50 AM

## 2015-01-21 DEATH — deceased

## 2016-12-25 IMAGING — CT CT ABD-PELV W/ CM
1 of 3 series · 13 of 32 positions shown, 18 images · IV contrast (OMNIPAQUE 300)
Comparison: 02/04/2013 and earlier.

CLINICAL DATA: [AGE] female with abdominal pain, epigastric
pain. Initial encounter.

EXAM:
CT ABDOMEN AND PELVIS WITH CONTRAST
TECHNIQUE: Multidetector CT imaging of the abdomen and pelvis was performed
using the standard protocol following bolus administration of
intravenous contrast.
CONTRAST:  80mL OMNIPAQUE IOHEXOL 300 MG/ML SOLN, 50mL OMNIPAQUE
IOHEXOL 300 MG/ML SOLN

[Series 2: abd/pel with · axial · 0.68mm/px · z∈[+1074,+1410]mm · 13 of 77 slices shown, 18 images]
[im 5/77  soft-tissue]
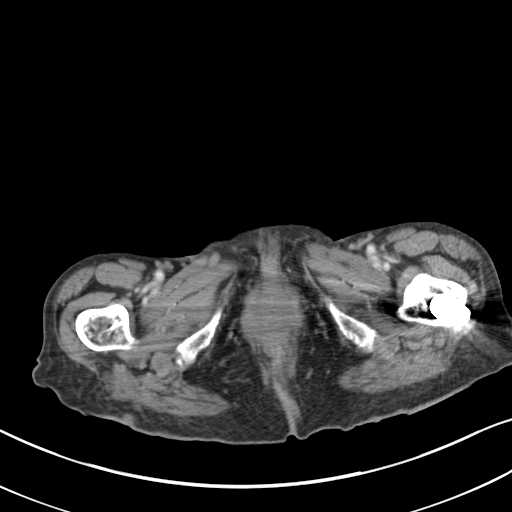
[im 5/77  bone]
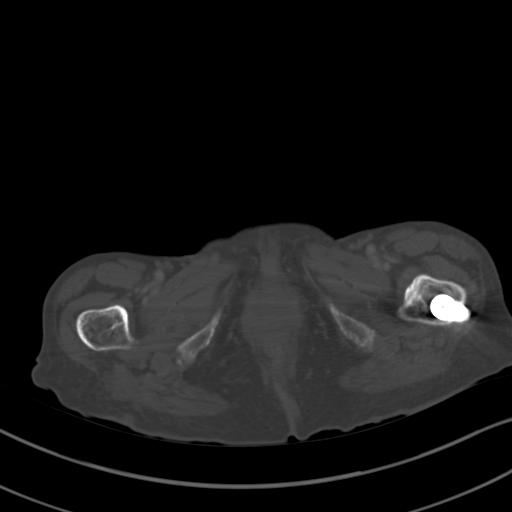
[im 13/77  soft-tissue]
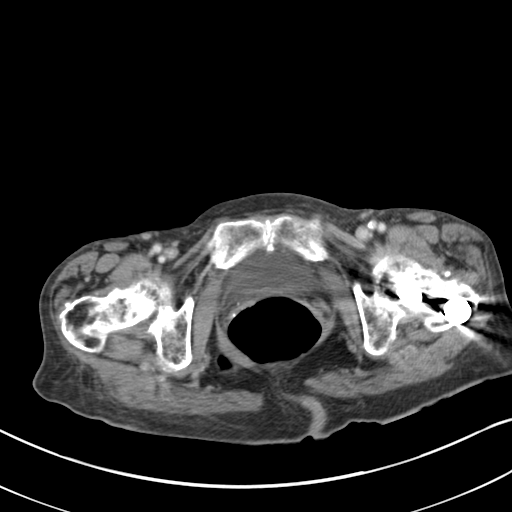
[im 17/77  soft-tissue]
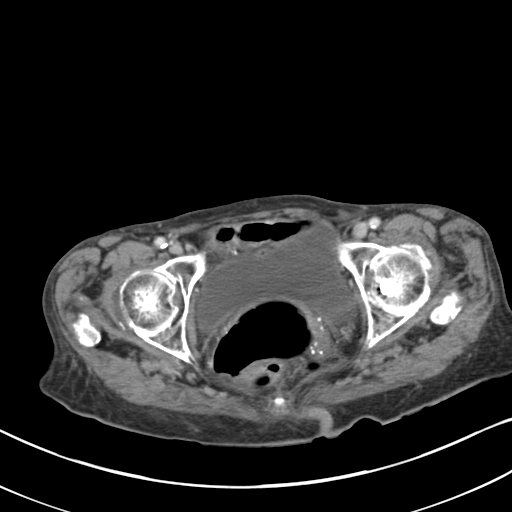
[im 22/77  soft-tissue]
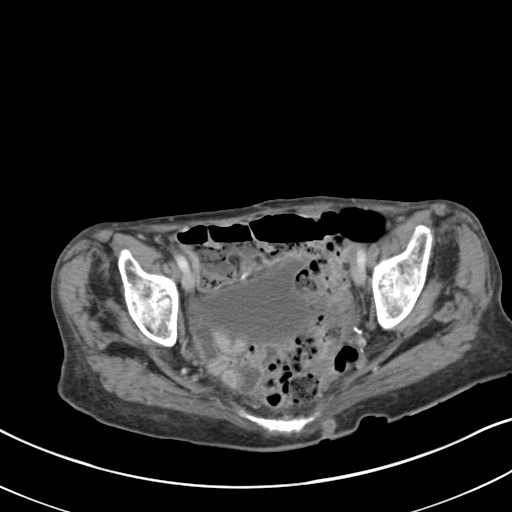
[im 30/77  soft-tissue]
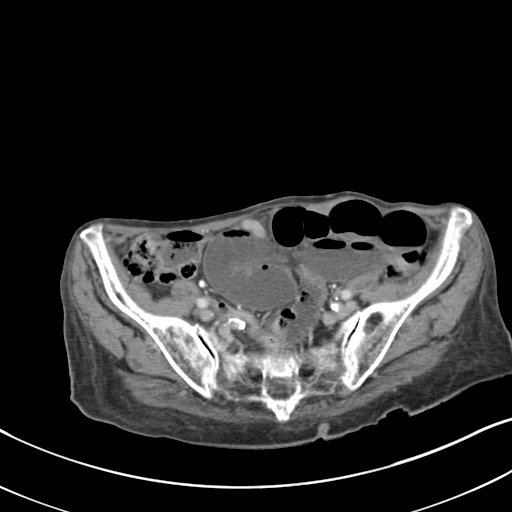
[im 34/77  soft-tissue]
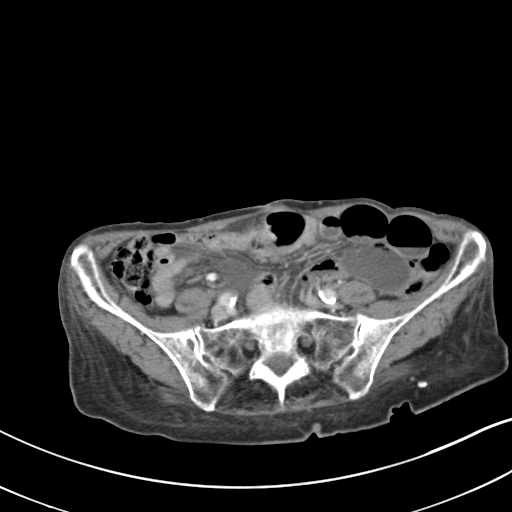
[im 43/77  soft-tissue]
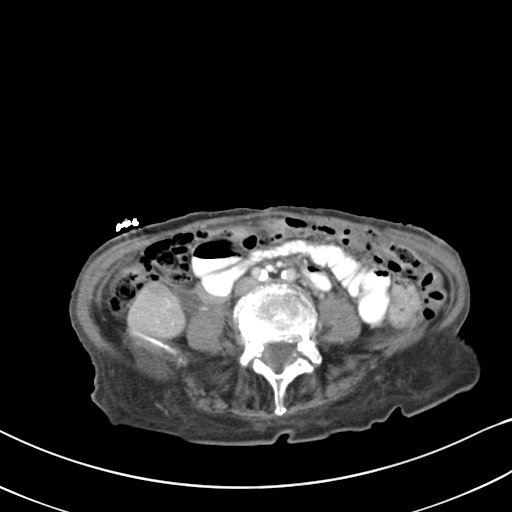
[im 47/77  soft-tissue]
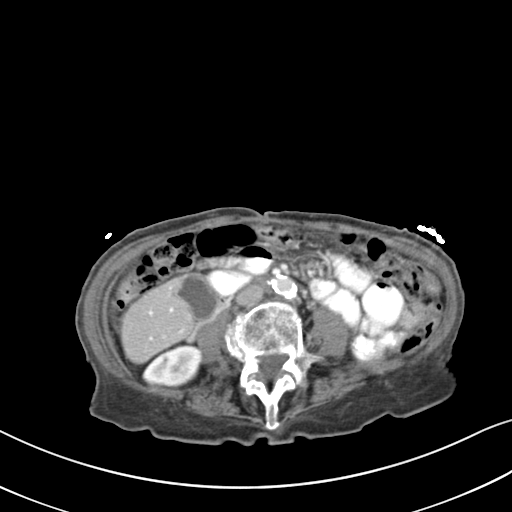
[im 55/77  soft-tissue]
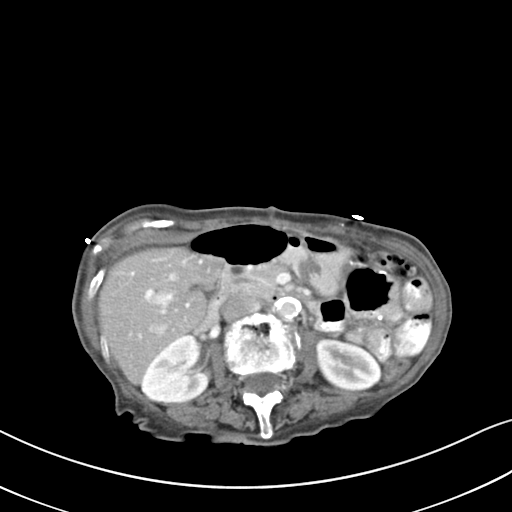
[im 55/77  bone]
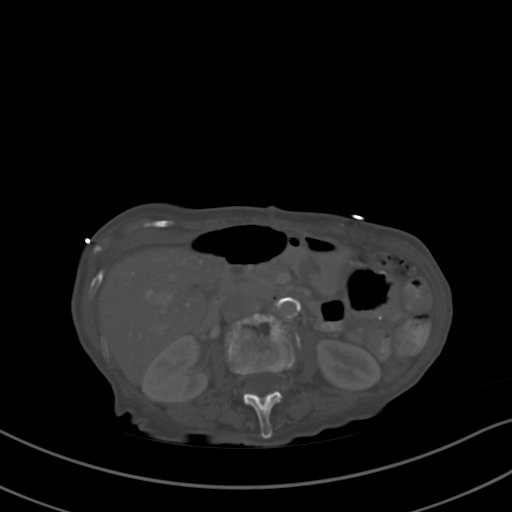
[im 60/77  soft-tissue]
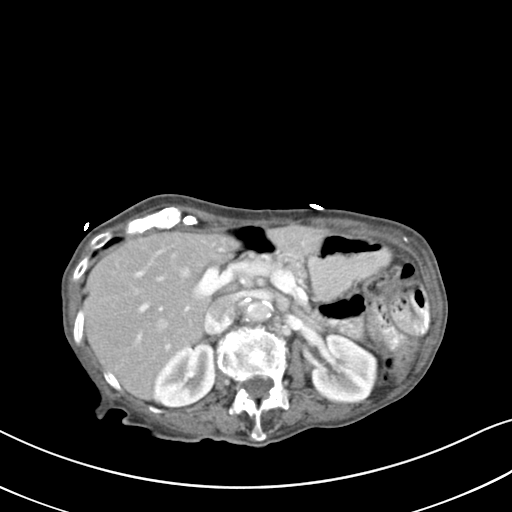
[im 60/77  lung]
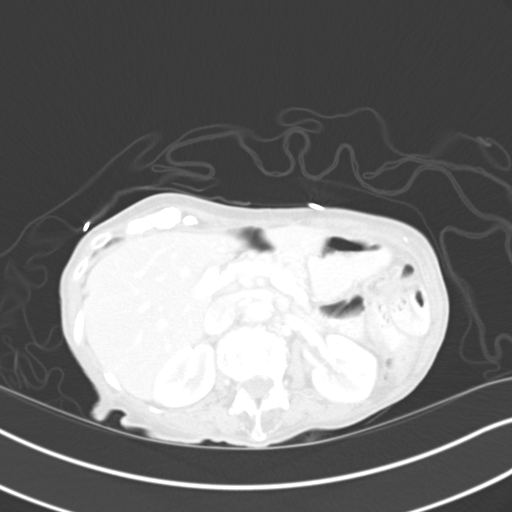
[im 64/77  soft-tissue]
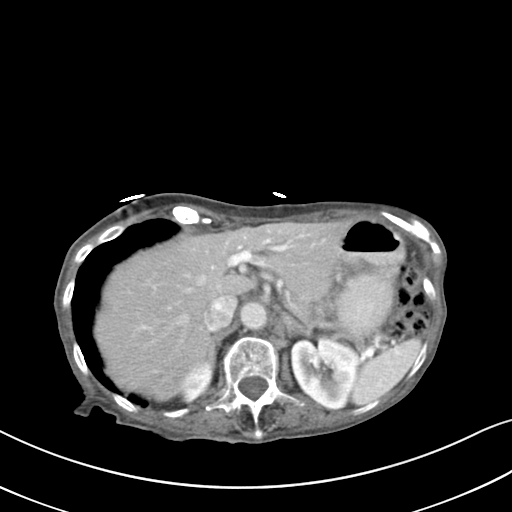
[im 64/77  lung]
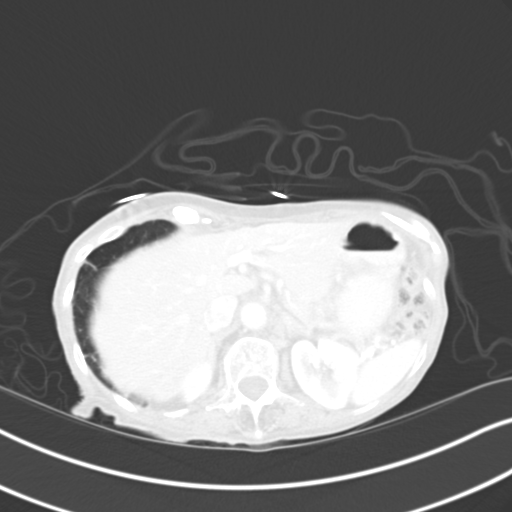
[im 68/77  lung]
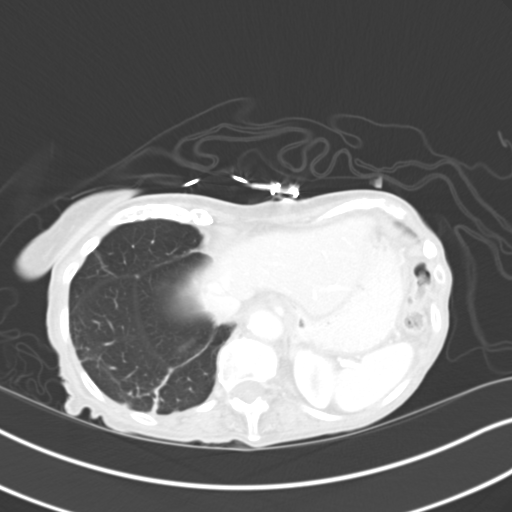
[im 72/77  soft-tissue]
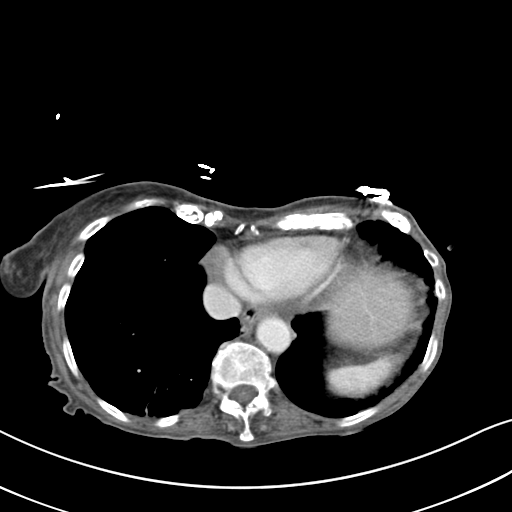
[im 72/77  lung]
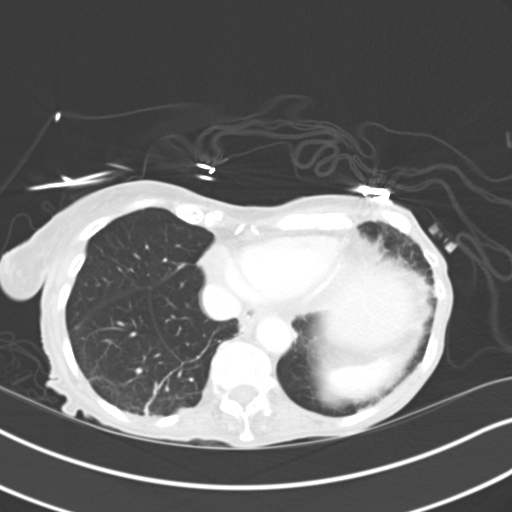

[13 of 32 positions shown; findings below may reference images not displayed]

FINDINGS: No pericardial or pleural effusion. Stable mild lung base
atelectasis or scarring.

Osteopenia. Advanced degenerative changes in the spine. Pectus
excavatum. Interval ORIF of the proximal left femur. No acute
osseous abnormality identified.

No pelvic free fluid identified. Gas in the rectum. Mildly distended
but unremarkable bladder. Sigmoid diverticulosis, with no sigmoid
wall thickening or mesenteric stranding identified. The left colon
is decompressed. The splenic flexure and transverse colon are
decompressed. Small volume of gas and stool in the right colon. The
cecum is located in the midline of the anterior pelvis.

There are then Fluid-filled small bowel loops measuring up to 3 cm
diameter. The terminal ileum is decompressed. There appears to be in
abrupt transition point in the midline of the pelvis near the 31 mm
diameter loop annotated on series 2, image 48. The stomach is fairly
decompressed. The first portion of the duodenum is chronically
distended, but the distal duodenum then is within normal limits.
There is an anastomosis in the left upper quadrant small bowel. Oral
contrast has progressed pass this point.

No abdominal free fluid or free air identified. Liver, gallbladder,
spleen, pancreas, adrenal glands, and kidneys are within normal
limits. Portal venous system is patent. Aortoiliac calcified
atherosclerosis noted. Major arterial structures are patent. No
lymphadenopathy identified.
IMPRESSION: 1. Small bowel obstruction in the lower abdomen and pelvis. Abrupt
transition point occurs near the dilated loop annotated on series 2,
image 48.
2. The upstream small bowel also is relatively decompressed, but
there is no strong evidence of a closed loop obstruction at this
time (e.g. no bowel wall thickening, mesenteric edema, or free
fluid).
3. Previous left upper quadrant small bowel resection and
reanastomosis.
4. Sigmoid diverticulosis with no active inflammation.
